# Patient Record
Sex: Male | Born: 1935 | Race: White | Hispanic: No | Marital: Married | Smoking: Never smoker
Health system: Southern US, Community
[De-identification: ages and names within clinical notes are randomized; demographics above are authoritative.]

## PROBLEM LIST (undated history)

## (undated) DIAGNOSIS — I499 Cardiac arrhythmia, unspecified: Secondary | ICD-10-CM

## (undated) DIAGNOSIS — E785 Hyperlipidemia, unspecified: Secondary | ICD-10-CM

## (undated) DIAGNOSIS — I4819 Other persistent atrial fibrillation: Secondary | ICD-10-CM

## (undated) DIAGNOSIS — I10 Essential (primary) hypertension: Secondary | ICD-10-CM

## (undated) DIAGNOSIS — Z923 Personal history of irradiation: Secondary | ICD-10-CM

## (undated) DIAGNOSIS — C61 Malignant neoplasm of prostate: Secondary | ICD-10-CM

## (undated) HISTORY — PX: CAROTID ARTERY ANGIOPLASTY: SHX1300

## (undated) HISTORY — PX: INSERTION PROSTATE RADIATION SEED: SUR718

## (undated) HISTORY — DX: Personal history of irradiation: Z92.3

## (undated) HISTORY — PX: CATARACT EXTRACTION: SUR2

## (undated) HISTORY — DX: Cardiac arrhythmia, unspecified: I49.9

## (undated) HISTORY — PX: TONSILLECTOMY: SUR1361

---

## 2016-08-27 DIAGNOSIS — C61 Malignant neoplasm of prostate: Secondary | ICD-10-CM | POA: Insufficient documentation

## 2017-07-21 ENCOUNTER — Encounter: Payer: Self-pay | Admitting: Intensive Care

## 2017-07-21 ENCOUNTER — Emergency Department: Payer: Medicare Other

## 2017-07-21 ENCOUNTER — Inpatient Hospital Stay
Admission: EM | Admit: 2017-07-21 | Discharge: 2017-07-22 | DRG: 293 | Disposition: A | Discharge status: Home / Self Care | Payer: Medicare Other | Attending: Internal Medicine | Admitting: Internal Medicine

## 2017-07-21 DIAGNOSIS — Z7982 Long term (current) use of aspirin: Secondary | ICD-10-CM | POA: Diagnosis not present

## 2017-07-21 DIAGNOSIS — I4891 Unspecified atrial fibrillation: Secondary | ICD-10-CM | POA: Diagnosis present

## 2017-07-21 DIAGNOSIS — I110 Hypertensive heart disease with heart failure: Secondary | ICD-10-CM | POA: Diagnosis present

## 2017-07-21 DIAGNOSIS — E782 Mixed hyperlipidemia: Secondary | ICD-10-CM | POA: Diagnosis present

## 2017-07-21 DIAGNOSIS — J810 Acute pulmonary edema: Secondary | ICD-10-CM

## 2017-07-21 DIAGNOSIS — I10 Essential (primary) hypertension: Secondary | ICD-10-CM | POA: Diagnosis present

## 2017-07-21 DIAGNOSIS — R0602 Shortness of breath: Secondary | ICD-10-CM

## 2017-07-21 DIAGNOSIS — I5041 Acute combined systolic (congestive) and diastolic (congestive) heart failure: Secondary | ICD-10-CM | POA: Diagnosis present

## 2017-07-21 DIAGNOSIS — Z8546 Personal history of malignant neoplasm of prostate: Secondary | ICD-10-CM

## 2017-07-21 DIAGNOSIS — Z79899 Other long term (current) drug therapy: Secondary | ICD-10-CM

## 2017-07-21 DIAGNOSIS — I5021 Acute systolic (congestive) heart failure: Secondary | ICD-10-CM | POA: Diagnosis present

## 2017-07-21 HISTORY — DX: Malignant neoplasm of prostate: C61

## 2017-07-21 HISTORY — DX: Essential (primary) hypertension: I10

## 2017-07-21 LAB — BASIC METABOLIC PANEL
ANION GAP: 9 (ref 5–15)
BUN: 22 mg/dL — AB (ref 6–20)
CALCIUM: 9.3 mg/dL (ref 8.9–10.3)
CO2: 21 mmol/L — AB (ref 22–32)
Chloride: 110 mmol/L (ref 101–111)
Creatinine, Ser: 1.12 mg/dL (ref 0.61–1.24)
GFR calc Af Amer: >60 mL/min (ref >60)
GFR calc non Af Amer: 59 mL/min — ABNORMAL LOW (ref >60)
GLUCOSE: 86 mg/dL (ref 65–99)
POTASSIUM: 4.2 mmol/L (ref 3.5–5.1)
Sodium: 140 mmol/L (ref 135–145)

## 2017-07-21 LAB — CBC
HEMATOCRIT: 46.1 % (ref 40.0–52.0)
HEMOGLOBIN: 15.4 g/dL (ref 13.0–18.0)
MCH: 31 pg (ref 26.0–34.0)
MCHC: 33.4 g/dL (ref 32.0–36.0)
MCV: 92.6 fL (ref 80.0–100.0)
Platelets: 178 10*3/uL (ref 150–440)
RBC: 4.98 MIL/uL (ref 4.40–5.90)
RDW: 15.3 % — AB (ref 11.5–14.5)
WBC: 5.9 10*3/uL (ref 3.8–10.6)

## 2017-07-21 LAB — TROPONIN I: Troponin I: <0.03 ng/mL (ref <0.03)

## 2017-07-21 LAB — PROTIME-INR
INR: 1.13
Prothrombin Time: 14.6 seconds (ref 11.4–15.2)

## 2017-07-21 LAB — BRAIN NATRIURETIC PEPTIDE: B Natriuretic Peptide: 697 pg/mL — ABNORMAL HIGH (ref 0.0–100.0)

## 2017-07-21 MED ORDER — FUROSEMIDE 10 MG/ML IJ SOLN
40.0000 mg | Freq: Once | INTRAMUSCULAR | Status: AC
  Administered 2017-07-21: 40 mg via INTRAVENOUS
  Filled 2017-07-21: qty 4

## 2017-07-21 MED ORDER — DILTIAZEM HCL 25 MG/5ML IV SOLN
15.0000 mg | Freq: Once | INTRAVENOUS | Status: AC
  Administered 2017-07-21: 15 mg via INTRAVENOUS
  Filled 2017-07-21: qty 5

## 2017-07-21 NOTE — ED Notes (Addendum)
Pt 88-89% on RA, pt kindly refuses to wear o2 [place] . MD made aware

## 2017-07-21 NOTE — ED Triage Notes (Signed)
Parient reports he has started feeling SOB with exertion the past couple of weeks and went to see his PCP today and diagnosed with new onset A-fib. Left leg swelling noted with sores present on calf. A&O x4

## 2017-07-21 NOTE — ED Provider Notes (Signed)
[facility] Emergency Department Provider Note   ____________________________________________   I have reviewed the triage vital signs and the nursing notes.   HISTORY  Chief Complaint Shortness of Breath and Atrial Fibrillation   History limited by: Not Limited   HPI Chris Davis is a 83 y.o. male who presents to the emergency department today from [facility] because of concern for abnormal heart rhythm noted on EKG. Patient went to the clinic today because of concern for shortness of breath and weakness. He has noticed this over the past couple of weeks. It is worse with exertion. He states that it will than get better after rest. The patient denies any history of atrial fibrillation. He denies any new medication. Denies any recent illness or fevers.  Past Medical History:  Diagnosis Date  . Hypertension   . Prostate cancer ([place])     There are no active problems to display for this patient.   Past Surgical History:  Procedure Laterality Date  . CAROTID ARTERY ANGIOPLASTY      Prior to Admission medications   Not on File    Allergies Patient has no known allergies.  History reviewed. No pertinent family history.  Social History Social History  Substance Use Topics  . Smoking status: Never Smoker  . Smokeless tobacco: Never Used  . Alcohol use Yes     Comment: occ    Review of Systems Constitutional: No fever/chills Eyes: No visual changes. ENT: No sore throat. Cardiovascular: Denies chest pain. Respiratory: Positive for shortness of breath. Gastrointestinal: No abdominal pain.  No nausea, no vomiting.  No diarrhea.   Genitourinary: Negative for dysuria. Musculoskeletal: Positive for lower extremity swelling.  Skin: Negative for rash. Neurological: Negative for headaches, focal weakness or numbness.  ____________________________________________   PHYSICAL EXAM:  VITAL SIGNS: ED Triage Vitals  Enc Vitals Group     BP  07/21/17 1810 (!) 145/74     Pulse Rate 07/21/17 1810 (!) 53     Resp 07/21/17 1810 20     Temp 07/21/17 1810 98.5 F (36.9 C)     Temp Source 07/21/17 1810 Oral     SpO2 07/21/17 1810 95 %     Weight 07/21/17 1811 204 lb (92.5 kg)     Height 07/21/17 1811 5\' 8"  (1.727 m)     Head Circumference --      Peak Flow --      Pain Score --      Pain Loc --      Pain Edu? --      Excl. in [place]? --      Constitutional: Alert and oriented. Well appearing and in no distress. Eyes: Conjunctivae are normal.  ENT   Head: Normocephalic and atraumatic.   Nose: No congestion/rhinnorhea.   Mouth/Throat: Mucous membranes are moist.   Neck: No stridor. Hematological/Lymphatic/Immunilogical: No cervical lymphadenopathy. Cardiovascular: Tachycardic, irregularly irregular rhythm.  No murmurs, rubs, or gallops. Respiratory: Normal respiratory effort without tachypnea nor retractions. Breath sounds are clear and equal bilaterally. No wheezes/rales/rhonchi. Gastrointestinal: Soft and non tender. No rebound. No guarding.  Genitourinary: Deferred Musculoskeletal: Normal range of motion in all extremities. 1+ bilateral pitting edema.  Neurologic:  Normal speech and language. No gross focal neurologic deficits are appreciated.  Skin:  Skin is warm, dry and intact. No rash noted. Psychiatric: Mood and affect are normal. Speech and behavior are normal. Patient exhibits appropriate insight and judgment.  ____________________________________________    LABS (pertinent positives/negatives)  Labs Reviewed  BASIC METABOLIC PANEL - Abnormal; Notable for the following:       Result Value   CO2 21 (*)    BUN 22 (*)    GFR calc non Af Amer 59 (*)    All other components within normal limits  CBC - Abnormal; Notable for the following:    RDW 15.3 (*)    All other components within normal limits  TROPONIN I  PROTIME-INR  BRAIN NATRIURETIC PEPTIDE      ____________________________________________   EKG  I, Nance Pear, attending physician, personally viewed and interpreted this EKG  EKG Time: 1806 Rate: 124 Rhythm: atrial fibrillation with RVR Axis: left axis deviation Intervals: qtc 520 QRS: narrow, q waves V1, V2 ST changes: no st elevation Impression: abnormal ekg  ____________________________________________    RADIOLOGY  CXR  IMPRESSION: Aortic atherosclerosis. Mild cardiomegaly with probable bilateral pulmonary edema and mild pleural effusions.  ____________________________________________   PROCEDURES  Procedures  ____________________________________________   INITIAL IMPRESSION / ASSESSMENT AND PLAN / ED COURSE  Pertinent labs & imaging results that were available during my care of the patient were reviewed by me and considered in my medical decision making (see chart for details).  Patient presented to the emergency department today because of concerns for abnormal heart rhythm. Patient does appear to be in A. fib. Nose have frequent PVCs. The patient's workup is concerning for heart failure given pulmonary edema and lower leg swelling. Will give patient Lasix. Will plan on admission to the hospitalist service.  ____________________________________________   FINAL CLINICAL IMPRESSION(S) / ED DIAGNOSES  Final diagnoses:  None     Note: This dictation was prepared with Dragon dictation. Any transcriptional errors that result from this process are unintentional     Nance Pear, MD 07/21/17 2136

## 2017-07-21 NOTE — H&P (Signed)
[place] at [place] NAME: Chris Davis    MR#:  [id number]  DATE OF BIRTH:  May 02, 1932  DATE OF ADMISSION:  07/21/2017  PRIMARY CARE PHYSICIAN: Derinda Late, MD   REQUESTING/REFERRING PHYSICIAN: Archie Balboa, MD  CHIEF COMPLAINT:   Chief Complaint  Patient presents with  . Shortness of Breath  . Atrial Fibrillation    HISTORY OF PRESENT ILLNESS:  Chris Davis  is a 83 y.o. male who presents with Progressive shortness of breath, increased leg swelling. He went to see his outpatient physician today who did an EKG and found that he was in A. fib. He was sent to the hospital for further evaluation and treatment. Here he was found to have an elevated BNP, he was in A. fib with RVR. His heart rate controlled well with some diltiazem in the ED. Hospitalists were called for admission and further evaluation.  PAST MEDICAL HISTORY:   Past Medical History:  Diagnosis Date  . Hypertension   . Prostate cancer ([place])     PAST SURGICAL HISTORY:   Past Surgical History:  Procedure Laterality Date  . CAROTID ARTERY ANGIOPLASTY      SOCIAL HISTORY:   Social History  Substance Use Topics  . Smoking status: Never Smoker  . Smokeless tobacco: Never Used  . Alcohol use Yes     Comment: occ    FAMILY HISTORY:   Family History  Problem Relation Age of Onset  . Breast cancer Sister   . Throat cancer Paternal Grandmother     DRUG ALLERGIES:  No Known Allergies  MEDICATIONS AT HOME:   Prior to Admission medications   Medication Sig Start Date End Date Taking? Authorizing Provider  aspirin EC 81 MG tablet Take 2 tablets by mouth daily.   Yes [provider]  atorvastatin (LIPITOR) 10 MG tablet Take 10 mg by mouth 2 (two) times daily. 06/04/17  Yes [provider]  Cranberry 400 MG CAPS Take 400 mg by mouth daily.   Yes [provider]  fluticasone (FLONASE) 50 MCG/ACT nasal spray Place 2 sprays into the nose  daily. 04/07/17 04/07/18 Yes [provider]  metoprolol tartrate (LOPRESSOR) 50 MG tablet Take 50 mg by mouth 2 (two) times daily. 06/02/17  Yes [provider]  Multiple Vitamin (MULTI-VITAMINS) TABS Take 1 tablet by mouth daily.   Yes [provider]  RA ALLERGY RELIEF 10 MG tablet Take 10 mg by mouth daily. 07/09/17  Yes [provider]    REVIEW OF SYSTEMS:  Review of Systems  Constitutional: Negative for chills, fever, malaise/fatigue and weight loss.  HENT: Negative for ear pain, hearing loss and tinnitus.   Eyes: Negative for blurred vision, double vision, pain and redness.  Respiratory: Positive for shortness of breath. Negative for cough and hemoptysis.   Cardiovascular: Positive for leg swelling. Negative for chest pain, palpitations and orthopnea.  Gastrointestinal: Negative for abdominal pain, constipation, diarrhea, nausea and vomiting.  Genitourinary: Negative for dysuria, frequency and hematuria.  Musculoskeletal: Negative for back pain, joint pain and neck pain.  Skin:       No acne, rash, or lesions  Neurological: Negative for dizziness, tremors, focal weakness and weakness.  Endo/Heme/Allergies: Negative for polydipsia. Does not bruise/bleed easily.  Psychiatric/Behavioral: Negative for depression. The patient is not nervous/anxious and does not have insomnia.      VITAL SIGNS:   Vitals:   07/21/17 2137 07/21/17 2200 07/21/17 2230 07/21/17 2246  BP: (!) 167/105  135/88 (!) 88/67 (!) 91/58  Pulse: (!) 108 85 (!) 54 (!) 44  Resp: (!) 24 17 20  (!) 22  Temp:      TempSrc:      SpO2: 93% 90% 90% 90%  Weight:      Height:       Wt Readings from Last 3 Encounters:  07/21/17 92.5 kg (204 lb)    PHYSICAL EXAMINATION:  Physical Exam  Vitals reviewed. Constitutional: He is oriented to person, place, and time. He appears well-developed and well-nourished. No distress.  HENT:  Head: Normocephalic and atraumatic.  Mouth/Throat:  Oropharynx is clear and moist.  Eyes: Pupils are equal, round, and reactive to light. Conjunctivae and EOM are normal. No scleral icterus.  Neck: Normal range of motion. Neck supple. No JVD present. No thyromegaly present.  Cardiovascular: Normal rate and intact distal pulses.  Exam reveals no gallop and no friction rub.   No murmur heard. Irregular rhythm  Respiratory: Effort normal. No respiratory distress. He has no wheezes. He has rales.  GI: Soft. Bowel sounds are normal. He exhibits no distension. There is no tenderness.  Musculoskeletal: Normal range of motion. He exhibits edema.  No arthritis, no gout  Lymphadenopathy:    He has no cervical adenopathy.  Neurological: He is alert and oriented to person, place, and time. No cranial nerve deficit.  No dysarthria, no aphasia  Skin: Skin is warm and dry. No rash noted. No erythema.  Psychiatric: He has a normal mood and affect. His behavior is normal. Judgment and thought content normal.    LABORATORY PANEL:   CBC  Recent Labs Lab 07/21/17 1832  WBC 5.9  HGB 15.4  HCT 46.1  PLT 178   ------------------------------------------------------------------------------------------------------------------  Chemistries   Recent Labs Lab 07/21/17 1832  NA 140  K 4.2  CL 110  CO2 21*  GLUCOSE 86  BUN 22*  CREATININE 1.12  CALCIUM 9.3   ------------------------------------------------------------------------------------------------------------------  Cardiac Enzymes  Recent Labs Lab 07/21/17 1832  TROPONINI <0.03   ------------------------------------------------------------------------------------------------------------------  RADIOLOGY:  Dg Chest 2 View  Result Date: 07/21/2017 CLINICAL DATA:  Shortness of breath. EXAM: CHEST  2 VIEW COMPARISON:  None. FINDINGS: Mild cardiomegaly is noted. Atherosclerosis of thoracic aorta is noted. No pneumothorax is noted. Mild bilateral perihilar and basilar interstitial  densities are noted concerning for pulmonary edema. Mild bilateral pleural effusions are noted, with right greater than left. Bony thorax is unremarkable. IMPRESSION: Aortic atherosclerosis. Mild cardiomegaly with probable bilateral pulmonary edema and mild pleural effusions. Electronically Signed   By: Marijo Conception, M.D.   On: 07/21/2017 21:05    EKG:   Orders placed or performed during the hospital encounter of 07/21/17  . EKG 12-Lead  . EKG 12-Lead    IMPRESSION AND PLAN:  Principal Problem:   Acute systolic CHF (congestive heart failure) (HCC) - IV Lasix given in the ED with good effect. We will get an echocardiogram tomorrow to cardiology consult, we'll trend his cardiac enzymes tonight Active Problems:   New onset atrial fibrillation ([place]) - continue rate controlling medication, cardiology consult and workup as above   HTN (hypertension) - patient's blood pressure is low and normotensive this time, hold antihypertensives for now  All the records are reviewed and case discussed with ED provider. Management plans discussed with the patient and/or family.  DVT PROPHYLAXIS: SubQ lovenoxt  GI PROPHYLAXIS: None  ADMISSION STATUS: Inpatient  CODE STATUS: Full Code Status History    This patient does not have a  recorded code status. Please follow your organizational policy for patients in this situation.    Advance Directive Documentation     Most Recent Value  Type of Advance Directive  Healthcare Power of Attorney, Living will, Out of facility DNR (pink MOST or yellow form)  Pre-existing out of facility DNR order (yellow form or pink MOST form)  -  "MOST" Form in Place?  -      TOTAL TIME TAKING CARE OF THIS PATIENT: 45 minutes.   Jannifer Franklin, [place] 07/21/2017, 11:46 PM  [facility]  Office  [phone]  CC: Primary care physician; Derinda Late, MD  Note:  This document was prepared using Dragon voice recognition software and may include  unintentional dictation errors.

## 2017-07-22 LAB — BASIC METABOLIC PANEL
ANION GAP: 9 (ref 5–15)
BUN: 19 mg/dL (ref 6–20)
CO2: 25 mmol/L (ref 22–32)
Calcium: 9.3 mg/dL (ref 8.9–10.3)
Chloride: 109 mmol/L (ref 101–111)
Creatinine, Ser: 1.19 mg/dL (ref 0.61–1.24)
GFR calc non Af Amer: 55 mL/min — ABNORMAL LOW (ref >60)
Glucose, Bld: 90 mg/dL (ref 65–99)
POTASSIUM: 4 mmol/L (ref 3.5–5.1)
Sodium: 143 mmol/L (ref 135–145)

## 2017-07-22 LAB — CBC
HEMATOCRIT: 45.8 % (ref 40.0–52.0)
HEMOGLOBIN: 15.5 g/dL (ref 13.0–18.0)
MCH: 31.3 pg (ref 26.0–34.0)
MCHC: 33.8 g/dL (ref 32.0–36.0)
MCV: 92.4 fL (ref 80.0–100.0)
PLATELETS: 169 10*3/uL (ref 150–440)
RBC: 4.95 MIL/uL (ref 4.40–5.90)
RDW: 15.2 % — ABNORMAL HIGH (ref 11.5–14.5)
WBC: 6.4 10*3/uL (ref 3.8–10.6)

## 2017-07-22 LAB — TROPONIN I: Troponin I: <0.03 ng/mL (ref <0.03)

## 2017-07-22 LAB — TSH: TSH: 1.992 u[IU]/mL (ref 0.350–4.500)

## 2017-07-22 MED ORDER — METOPROLOL TARTRATE 50 MG PO TABS
50.0000 mg | ORAL_TABLET | Freq: Two times a day (BID) | ORAL | Status: DC
  Administered 2017-07-22: 50 mg via ORAL
  Filled 2017-07-22: qty 1

## 2017-07-22 MED ORDER — APIXABAN 5 MG PO TABS: 5.0000 mg | ORAL_TABLET | Freq: Two times a day (BID) | ORAL | 0 refills | Status: DC

## 2017-07-22 MED ORDER — FUROSEMIDE 10 MG/ML IJ SOLN
20.0000 mg | Freq: Two times a day (BID) | INTRAMUSCULAR | Status: DC
  Administered 2017-07-22: 20 mg via INTRAVENOUS
  Filled 2017-07-22: qty 2

## 2017-07-22 MED ORDER — POTASSIUM CHLORIDE ER 10 MEQ PO TBCR: 10.0000 meq | EXTENDED_RELEASE_TABLET | Freq: Every day | ORAL | 0 refills | Status: DC

## 2017-07-22 MED ORDER — ACETAMINOPHEN 325 MG PO TABS: 650.0000 mg | ORAL_TABLET | Freq: Four times a day (QID) | ORAL | Status: DC | PRN

## 2017-07-22 MED ORDER — ASPIRIN EC 81 MG PO TBEC
162.0000 mg | DELAYED_RELEASE_TABLET | Freq: Every day | ORAL | Status: DC
  Administered 2017-07-22: 162 mg via ORAL
  Filled 2017-07-22: qty 2

## 2017-07-22 MED ORDER — ENOXAPARIN SODIUM 40 MG/0.4ML ~~LOC~~ SOLN: 40.0000 mg | SUBCUTANEOUS | Status: DC

## 2017-07-22 MED ORDER — ATORVASTATIN CALCIUM 10 MG PO TABS
10.0000 mg | ORAL_TABLET | Freq: Two times a day (BID) | ORAL | Status: DC
  Administered 2017-07-22: 10 mg via ORAL
  Filled 2017-07-22: qty 1

## 2017-07-22 MED ORDER — ACETAMINOPHEN 650 MG RE SUPP: 650.0000 mg | Freq: Four times a day (QID) | RECTAL | Status: DC | PRN

## 2017-07-22 MED ORDER — ONDANSETRON HCL 4 MG/2ML IJ SOLN: 4.0000 mg | Freq: Four times a day (QID) | INTRAMUSCULAR | Status: DC | PRN

## 2017-07-22 MED ORDER — FUROSEMIDE 40 MG PO TABS: 40.0000 mg | ORAL_TABLET | Freq: Two times a day (BID) | ORAL | 0 refills | Status: DC

## 2017-07-22 MED ORDER — ONDANSETRON HCL 4 MG PO TABS: 4.0000 mg | ORAL_TABLET | Freq: Four times a day (QID) | ORAL | Status: DC | PRN

## 2017-07-22 NOTE — Progress Notes (Signed)
Discharge Instructions reviewed with patient and spouse with patient's permission. Both parties verbalized understanding. Printed Prescriptions given to patient and patient reminded to follow up with all follow up appointments including primary care physician. IV dc'ed, cath intact. Vitals rechecked prior to discharge- MD aware of heart rate prior to d/c order. Patient denies any pain. Patient to be transported to vehicle via wheelchair. Patient's spouse to drive him home.

## 2017-07-22 NOTE — Discharge Summary (Signed)
[place] at [place] NAME: Chris Davis    MR#:  [id number]  DATE OF BIRTH:  February 18, 1932  DATE OF ADMISSION:  07/21/2017 ADMITTING PHYSICIAN: Lance Coon, MD  DATE OF DISCHARGE: 07/22/2017  PRIMARY CARE PHYSICIAN: Derinda Late, MD    ADMISSION DIAGNOSIS:  Acute pulmonary edema (HCC) [J81.0] SOB (shortness of breath) [R06.02] Atrial fibrillation, unspecified type ([place]) [I48.91]  DISCHARGE DIAGNOSIS:  Principal Problem:   Acute systolic CHF (congestive heart failure) (HCC) Active Problems:   New onset atrial fibrillation (HCC)   HTN (hypertension)   SECONDARY DIAGNOSIS:   Past Medical History:  Diagnosis Date  . Hypertension   . Prostate cancer [facility])     HOSPITAL COURSE:   83 y/o male with HTN who presented with SOB and found to have new Onset atrial fibrillation and pulmonary edema.  1. Acute congestive heart failure with pulmonary edema and elevated BNP thought to be due to new onset atrial fibrillation. Patient was evaluated by cardiology. Cardiologist is recommended outpatient echocardiogram as patient's symptoms have significantly improved since admission. He was diuresed with IV Lasix. He will continue on oral Lasix with potassium supplementation. He is referred to CHF clinic. He will have follow-up on Thursday and an echocardiogram at that time performed so we can assess his ejection fraction. He will also need BNP on Thursday.  2. New onset atrial fibrillation: Cardiology has recommended continue metoprolol for heart rate control and Eliquis for CVA prevention. Risks, side effects alternatives and benefits were discussed with the patient.  3. HTN, essential benign: Continue metoprolol.    DISCHARGE CONDITIONS AND DIET:   Stable Cardiac diet  CONSULTS OBTAINED:  Treatment Team:  Corey Skains, MD  DRUG ALLERGIES:  No Known Allergies  DISCHARGE MEDICATIONS:   Current Discharge Medication List    START  taking these medications   Details  apixaban (ELIQUIS) 5 MG TABS tablet Take 1 tablet (5 mg total) by mouth 2 (two) times daily. Qty: 60 tablet, Refills: 0    furosemide (LASIX) 40 MG tablet Take 1 tablet (40 mg total) by mouth 2 (two) times daily. Qty: 30 tablet, Refills: 0    potassium chloride (K-DUR) 10 MEQ tablet Take 1 tablet (10 mEq total) by mouth daily. Qty: 30 tablet, Refills: 0      CONTINUE these medications which have NOT CHANGED   Details  atorvastatin (LIPITOR) 10 MG tablet Take 10 mg by mouth 2 (two) times daily. Refills: 0    Cranberry 400 MG CAPS Take 400 mg by mouth daily.    fluticasone (FLONASE) 50 MCG/ACT nasal spray Place 2 sprays into the nose daily.    metoprolol tartrate (LOPRESSOR) 50 MG tablet Take 50 mg by mouth 2 (two) times daily. Refills: 0    Multiple Vitamin (MULTI-VITAMINS) TABS Take 1 tablet by mouth daily.    RA ALLERGY RELIEF 10 MG tablet Take 10 mg by mouth daily. Refills: 0      STOP taking these medications     aspirin EC 81 MG tablet           Today   CHIEF COMPLAINT:  Patient without dyspnea, shortness of breath, PND orthopnea this morning. Feels 100% better. Is not complaining of chest pain or palpitations. States that he would like to go home. He still has mild lower extremity edema. He reports that his weight was 204. Today on the scale it is 191  Which is at his baseline.  VITAL SIGNS:  Blood pressure 140/72, pulse (!) 140, temperature 98.1 F (36.7 C), temperature source Oral, resp. rate 18, height 5\' 8"  (1.727 m), weight 86.9 kg (191 lb 8 oz), SpO2 95 %.   REVIEW OF SYSTEMS:  Review of Systems  Constitutional: Negative.  Negative for chills, fever and malaise/fatigue.  HENT: Negative.  Negative for ear discharge, ear pain, hearing loss, nosebleeds and sore throat.   Eyes: Negative.  Negative for blurred vision and pain.  Respiratory: Negative.  Negative for cough, hemoptysis, shortness of breath and wheezing.    Cardiovascular: Positive for leg swelling. Negative for chest pain and palpitations.  Gastrointestinal: Negative.  Negative for abdominal pain, blood in stool, diarrhea, nausea and vomiting.  Genitourinary: Negative.  Negative for dysuria.  Musculoskeletal: Negative.  Negative for back pain.  Skin: Negative.   Neurological: Negative for dizziness, tremors, speech change, focal weakness, seizures and headaches.  Endo/Heme/Allergies: Negative.  Does not bruise/bleed easily.  Psychiatric/Behavioral: Negative.  Negative for depression, hallucinations and suicidal ideas.     PHYSICAL EXAMINATION:  GENERAL:  83 y.o.-year-old patient lying in the bed with no acute distress.  NECK:  Supple, no jugular venous distention. No thyroid enlargement, no tenderness.  LUNGS: Normal breath sounds bilaterally, no wheezing, rales,rhonchi  No use of accessory muscles of respiration.  CARDIOVASCULAR: S1, S2 normal. No murmurs, rubs, or gallops.  ABDOMEN: Soft, non-tender, non-distended. Bowel sounds present. No organomegaly or mass.  EXTREMITIES: 1+ pedal edema, No cyanosis, or clubbing.  PSYCHIATRIC: The patient is alert and oriented x 3.  SKIN: No obvious rash, lesion, or ulcer.   DATA REVIEW:   CBC  Recent Labs Lab 07/22/17 0758  WBC 6.4  HGB 15.5  HCT 45.8  PLT 169    Chemistries   Recent Labs Lab 07/22/17 0758  NA 143  K 4.0  CL 109  CO2 25  GLUCOSE 90  BUN 19  CREATININE 1.19  CALCIUM 9.3    Cardiac Enzymes  Recent Labs Lab 07/21/17 1832 07/22/17 0236 07/22/17 0758  TROPONINI <0.03 <0.03 <0.03    Microbiology Results  @[email or web address] @  RADIOLOGY:  Dg Chest 2 View  Result Date: 07/21/2017 CLINICAL DATA:  Shortness of breath. EXAM: CHEST  2 VIEW COMPARISON:  None. FINDINGS: Mild cardiomegaly is noted. Atherosclerosis of thoracic aorta is noted. No pneumothorax is noted. Mild bilateral perihilar and basilar interstitial densities are noted concerning for pulmonary edema.  Mild bilateral pleural effusions are noted, with right greater than left. Bony thorax is unremarkable. IMPRESSION: Aortic atherosclerosis. Mild cardiomegaly with probable bilateral pulmonary edema and mild pleural effusions. Electronically Signed   By: Marijo Conception, M.D.   On: 07/21/2017 21:05      Current Discharge Medication List    START taking these medications   Details  apixaban (ELIQUIS) 5 MG TABS tablet Take 1 tablet (5 mg total) by mouth 2 (two) times daily. Qty: 60 tablet, Refills: 0    furosemide (LASIX) 40 MG tablet Take 1 tablet (40 mg total) by mouth 2 (two) times daily. Qty: 30 tablet, Refills: 0    potassium chloride (K-DUR) 10 MEQ tablet Take 1 tablet (10 mEq total) by mouth daily. Qty: 30 tablet, Refills: 0      CONTINUE these medications which have NOT CHANGED   Details  atorvastatin (LIPITOR) 10 MG tablet Take 10 mg by mouth 2 (two) times daily. Refills: 0    Cranberry 400 MG CAPS Take 400 mg by mouth daily.    fluticasone (FLONASE) 50 MCG/ACT nasal spray  Place 2 sprays into the nose daily.    metoprolol tartrate (LOPRESSOR) 50 MG tablet Take 50 mg by mouth 2 (two) times daily. Refills: 0    Multiple Vitamin (MULTI-VITAMINS) TABS Take 1 tablet by mouth daily.    RA ALLERGY RELIEF 10 MG tablet Take 10 mg by mouth daily. Refills: 0      STOP taking these medications     aspirin EC 81 MG tablet           Management plans discussed with the patient and he is in agreement. Stable for discharge home  Patient should follow up with dr Nehemiah Massed  CODE STATUS:     Code Status Orders        Start     Ordered   07/22/17 0222  Full code  Continuous     07/22/17 0222    Code Status History    Date Active Date Inactive Code Status Order ID Comments User Context   This patient has a current code status but no historical code status.    Advance Directive Documentation     Most Recent Value  Type of Advance Directive  Healthcare Power of  Attorney, Living will, Out of facility DNR (pink MOST or yellow form)  Pre-existing out of facility DNR order (yellow form or pink MOST form)  -  "MOST" Form in Place?  -      TOTAL TIME TAKING CARE OF THIS PATIENT: 37 minutes.    Note: This dictation was prepared with Dragon dictation along with smaller phrase technology. Any transcriptional errors that result from this process are unintentional.  Antoino Westhoff M.D on 07/22/2017 at 9:19 AM  Between 7am to 6pm - Pager - [phone] After 6pm go to [email or web address] - password [email or web address] [facility]  Office  [phone]  CC: Primary care physician; Derinda Late, MD

## 2017-07-22 NOTE — Consult Note (Signed)
[facility] Consultation Note  Patient ID: Chris Davis, MRN: [id number], DOB/AGE: 1932/11/26 83 y.o. Admit date: 07/21/2017   Date of Consult: 07/22/2017 Primary Physician: Derinda Late, MD Primary Cardiologist: None  Chief Complaint:  Chief Complaint  Patient presents with  . Shortness of Breath  . Atrial Fibrillation   Reason for Consult: shortness of breath with atrial fibrillation  HPI: 83 y.o. male with known essential hypertension mixed hyperlipidemia who has had some recent episodes of palpitations irregular heart beat over the last several weeks. When he does activities including walking he does have more shortness of breath with physical activity relieved by rest. The patient had some irregular heart beat as well. When further evaluation by his outpatient primary care physician the patient had an EKG showing atrial fibrillation with variable rate and preventricular contractions. There was no chest pain and no evidence of myocardial infarction with a normal troponin although it did patient does have a BNP of 679 consistent with possible diastolic dysfunction heart failure. The patient was placed on metoprolol with better heart rate control of atrial fibrillation at this time and feeling better. Furosemide was given with improvements of symptoms of shortness of breath. Additionally the patient is on high intensity cholesterol therapy and stable. She will need anticoagulation for further risk reduction with atrial fibrillation.  Past Medical History:  Diagnosis Date  . Hypertension   . Prostate cancer [facility])       Surgical History:  Past Surgical History:  Procedure Laterality Date  . CAROTID ARTERY ANGIOPLASTY       Home Meds: Prior to Admission medications   Medication Sig Start Date End Date Taking? Authorizing Provider  aspirin EC 81 MG tablet Take 2 tablets by mouth daily.   Yes [provider]  atorvastatin (LIPITOR) 10 MG tablet Take 10 mg by mouth  2 (two) times daily. 06/04/17  Yes [provider]  Cranberry 400 MG CAPS Take 400 mg by mouth daily.   Yes [provider]  fluticasone (FLONASE) 50 MCG/ACT nasal spray Place 2 sprays into the nose daily. 04/07/17 04/07/18 Yes [provider]  metoprolol tartrate (LOPRESSOR) 50 MG tablet Take 50 mg by mouth 2 (two) times daily. 06/02/17  Yes [provider]  Multiple Vitamin (MULTI-VITAMINS) TABS Take 1 tablet by mouth daily.   Yes [provider]  RA ALLERGY RELIEF 10 MG tablet Take 10 mg by mouth daily. 07/09/17  Yes [provider]    Inpatient Medications:  . aspirin EC  162 mg Oral Daily  . atorvastatin  10 mg Oral BID  . enoxaparin (LOVENOX) injection  40 mg Subcutaneous Q24H  . furosemide  20 mg Intravenous BID  . metoprolol tartrate  50 mg Oral BID     Allergies: No Known Allergies  Social History   Social History  . Marital status: Married    Spouse name: N/A  . Number of children: N/A  . Years of education: N/A   Occupational History  . Not on file.   Social History Main Topics  . Smoking status: Never Smoker  . Smokeless tobacco: Never Used  . Alcohol use Yes     Comment: occ  . Drug use: No  . Sexual activity: Not on file   Other Topics Concern  . Not on file   Social History Narrative  . No narrative on file     Family History  Problem Relation Age of Onset  . Breast cancer Sister   .  Throat cancer Paternal Grandmother      Review of Systems Positive for Shortness of breath palpitations Negative for: General:  chills, fever, night sweats or weight changes.  Cardiovascular: PND orthopnea syncope dizziness  Dermatological skin lesions rashes Respiratory: Cough congestion Urologic: Frequent urination urination at night and hematuria Abdominal: negative for nausea, vomiting, diarrhea, bright red blood per rectum, melena, or hematemesis Neurologic: negative for visual changes, and/or hearing changes   All other systems reviewed and are otherwise negative except as noted above.  Labs:  Recent Labs  07/21/17 1832 07/22/17 0236  TROPONINI <0.03 <0.03   Lab Results  Component Value Date   WBC 6.4 07/22/2017   HGB 15.5 07/22/2017   HCT 45.8 07/22/2017   MCV 92.4 07/22/2017   PLT 169 07/22/2017    Recent Labs Lab 07/21/17 1832  NA 140  K 4.2  CL 110  CO2 21*  BUN 22*  CREATININE 1.12  CALCIUM 9.3  GLUCOSE 86   No results found for: CHOL, HDL, LDLCALC, TRIG No results found for: DDIMER  Radiology/Studies:  Dg Chest 2 View  Result Date: 07/21/2017 CLINICAL DATA:  Shortness of breath. EXAM: CHEST  2 VIEW COMPARISON:  None. FINDINGS: Mild cardiomegaly is noted. Atherosclerosis of thoracic aorta is noted. No pneumothorax is noted. Mild bilateral perihilar and basilar interstitial densities are noted concerning for pulmonary edema. Mild bilateral pleural effusions are noted, with right greater than left. Bony thorax is unremarkable. IMPRESSION: Aortic atherosclerosis. Mild cardiomegaly with probable bilateral pulmonary edema and mild pleural effusions. Electronically Signed   By: Marijo Conception, M.D.   On: 07/21/2017 21:05    EKG: Atrial fibrillation with left axis deviation preventricular contractions  Weights: Filed Weights   07/21/17 1811 07/22/17 0205 07/22/17 0500  Weight: 92.5 kg (204 lb) 88.7 kg (195 lb 8 oz) 86.9 kg (191 lb 8 oz)     Physical Exam: Blood pressure 140/72, pulse (!) 120, temperature 98.1 F (36.7 C), temperature source Oral, resp. rate 18, height 5\' 8"  (1.727 m), weight 86.9 kg (191 lb 8 oz), SpO2 95 %. Body mass index is 29.12 kg/m. General: Well developed, well nourished, in no acute distress. Head eyes ears nose throat: Normocephalic, atraumatic, sclera non-icteric, no xanthomas, nares are without discharge. No apparent thyromegaly and/or mass  Lungs: Normal respiratory effort.  no wheezes,Few rales, no rhonchi.  Heart: Irregular with normal  S1 S2. no murmur gallop, no rub, PMI is normal size and placement, carotid upstroke normal without bruit, jugular venous pressure is normal Abdomen: Soft, non-tender, non-distended with normoactive bowel sounds. No hepatomegaly. No rebound/guarding. No obvious abdominal masses. Abdominal aorta is normal size without bruit Extremities: Trace edema. no cyanosis, no clubbing, no ulcers  Peripheral : 2+ bilateral upper extremity pulses, 2+ bilateral femoral pulses, 2+ bilateral dorsal pedal pulse Neuro: Alert and oriented. No facial asymmetry. No focal deficit. Moves all extremities spontaneously. Musculoskeletal: Normal muscle tone without kyphosis Psych:  Responds to questions appropriately with a normal affect.    Assessment: 83 year old male with essential hypertension mixed hyperlipidemia having symptoms consistent with atrial fibrillation with rapid ventricular rate without evidence of myocardial infarction and with the some diastolic dysfunction congestive heart failure  Plan: 1. Continue furosemide orally for diastolic dysfunction congestive heart failure and shortness of breath 2. Continue metoprolol at current dose for heart rate control of atrial fibrillation and possible spontaneous conversion to normal rhythm 3. Anticoagulation for further risk reduction in stroke with atrial fibrillation 4. Echocardiogram as outpatient for further  evaluation of structural abnormalities and other treatment options 5. High intensity cholesterol therapy 6. Begin ambulation and follow for improvements of symptoms and possible discharge home today with follow-up next week for adjustments of medications and not further treatment with possible conversion to normal rhythm  Signed, Corey Skains M.D. [facility] 07/22/2017, 8:43 AM

## 2017-07-22 NOTE — Care Management (Signed)
Patient to discharge home on new Eliquis.  Confirms that he has pharmacy coverage through his medicare plan.  Provided patient with 30 day trial coupon.  No other needs identified by patient or members of care team

## 2017-07-22 NOTE — Discharge Instructions (Signed)
Heart Failure Clinic appointment on July 30 2017 at 11:40am with Darylene Price, [place]. Please call [phone] to reschedule.

## 2017-07-30 ENCOUNTER — Ambulatory Visit: Payer: Medicare Other | Attending: Family | Admitting: Family

## 2017-07-30 ENCOUNTER — Encounter: Payer: Self-pay | Admitting: Family

## 2017-07-30 VITALS — BP 112/64 | HR 50 | Resp 18 | Ht 68.0 in | Wt 192.4 lb

## 2017-07-30 DIAGNOSIS — I110 Hypertensive heart disease with heart failure: Secondary | ICD-10-CM | POA: Diagnosis not present

## 2017-07-30 DIAGNOSIS — Z8546 Personal history of malignant neoplasm of prostate: Secondary | ICD-10-CM | POA: Insufficient documentation

## 2017-07-30 DIAGNOSIS — Z79899 Other long term (current) drug therapy: Secondary | ICD-10-CM | POA: Insufficient documentation

## 2017-07-30 DIAGNOSIS — I509 Heart failure, unspecified: Secondary | ICD-10-CM | POA: Insufficient documentation

## 2017-07-30 DIAGNOSIS — Z7901 Long term (current) use of anticoagulants: Secondary | ICD-10-CM | POA: Insufficient documentation

## 2017-07-30 DIAGNOSIS — I10 Essential (primary) hypertension: Secondary | ICD-10-CM

## 2017-07-30 DIAGNOSIS — I5022 Chronic systolic (congestive) heart failure: Secondary | ICD-10-CM

## 2017-07-30 DIAGNOSIS — I482 Chronic atrial fibrillation, unspecified: Secondary | ICD-10-CM

## 2017-07-30 DIAGNOSIS — Z923 Personal history of irradiation: Secondary | ICD-10-CM | POA: Insufficient documentation

## 2017-07-30 DIAGNOSIS — I4891 Unspecified atrial fibrillation: Secondary | ICD-10-CM | POA: Diagnosis not present

## 2017-07-30 DIAGNOSIS — Z803 Family history of malignant neoplasm of breast: Secondary | ICD-10-CM | POA: Diagnosis not present

## 2017-07-30 NOTE — Progress Notes (Signed)
Patient ID: Chris Davis, male    DOB: 16-Mar-1932, 83 y.o.   MRN: [id number]  HPI  Chris Davis is a 83 y/o male with a history of prostate cancer, HTN, atrial fibrillation and chronic heart failure.   Echo done 06/25/16 but unable to visualize results.  Admitted 07/21/17 due to HF exacerbation and new onset atrial fibrillation. BNP elevated. Cardiology consult obtained with recommendation of outpatient echocardiogram. Discharged the next day.   He presents today for his initial visit with a chief complaint of mild shortness of breath upon moderate exertion. He describes this as chronic in nature having been present for several months with varying levels of severity. He has associated fatigue, edema and easy bruising along with this. He denies any chest pain or weight gain.  Past Medical History:  Diagnosis Date  . Arrhythmia    atrial fibrillation  . Hx of radiation therapy   . Hypertension   . Prostate cancer ([place])   . Prostate cancer [facility])    Past Surgical History:  Procedure Laterality Date  . CAROTID ARTERY ANGIOPLASTY    . CATARACT EXTRACTION Bilateral   . INSERTION PROSTATE RADIATION SEED    . TONSILLECTOMY     Family History  Problem Relation Age of Onset  . Breast cancer Sister   . Throat cancer Paternal Grandmother    Social History  Substance Use Topics  . Smoking status: Never Smoker  . Smokeless tobacco: Never Used  . Alcohol use Yes     Comment: occ   No Known Allergies Prior to Admission medications   Medication Sig Start Date End Date Taking? Authorizing Provider  apixaban (ELIQUIS) 5 MG TABS tablet Take 1 tablet (5 mg total) by mouth 2 (two) times daily. 07/22/17  Yes Mody, Ulice Bold, MD  atorvastatin (LIPITOR) 10 MG tablet Take 10 mg by mouth 2 (two) times daily. 06/04/17  Yes [provider]  Cranberry 400 MG CAPS Take 400 mg by mouth daily.   Yes [provider]  furosemide (LASIX) 40 MG tablet Take 40 mg by mouth daily.   Yes [provider]  metoprolol tartrate (LOPRESSOR) 50 MG tablet Take 50 mg by mouth 2 (two) times daily. 06/02/17  Yes [provider]  Multiple Vitamin (MULTI-VITAMINS) TABS Take 1 tablet by mouth daily.   Yes [provider]  potassium chloride (K-DUR) 10 MEQ tablet Take 1 tablet (10 mEq total) by mouth daily. 07/22/17  Yes Mody, Ulice Bold, MD  fluticasone (FLONASE) 50 MCG/ACT nasal spray Place 2 sprays into the nose daily. 04/07/17 04/07/18  [provider]  RA ALLERGY RELIEF 10 MG tablet Take 10 mg by mouth daily. 07/09/17   [provider]   Review of Systems  Constitutional: Positive for fatigue. Negative for appetite change.  HENT: Negative for congestion, postnasal drip and sore throat.   Eyes: Negative.   Respiratory: Positive for shortness of breath. Negative for chest tightness.   Cardiovascular: Positive for leg swelling. Negative for chest pain and palpitations.  Gastrointestinal: Negative for abdominal distention and abdominal pain.  Endocrine: Negative.   Genitourinary: Negative.   Musculoskeletal: Negative for back pain and neck pain.  Skin: Negative.   Allergic/Immunologic: Negative.   Neurological: Negative for dizziness and light-headedness.  Hematological: Negative for adenopathy. Bruises/bleeds easily.  Psychiatric/Behavioral: Negative for dysphoric mood and sleep disturbance (sleeping on 1 pillow). The patient is not nervous/anxious.    Vitals:   07/30/17 1159  BP: 112/64  Pulse: (!) 50  Resp: 18  SpO2: 99%  Weight: 192 lb 6 oz (87.3 kg)  Height: 5\' 8"  (1.727 m)   Wt Readings from Last 3 Encounters:  07/30/17 192 lb 6 oz (87.3 kg)  07/22/17 191 lb 8 oz (86.9 kg)   Lab Results  Component Value Date   CREATININE 1.19 07/22/2017   CREATININE 1.12 07/21/2017    Physical Exam  Constitutional: He is oriented to person, place, and time. He appears well-developed and well-nourished.  HENT:  Head: Normocephalic and atraumatic.  Neck:  Normal range of motion. Neck supple. No JVD present.  Cardiovascular: An irregular rhythm present. Bradycardia present.   Pulmonary/Chest: Effort normal. He has no wheezes. He has no rales.  Abdominal: Soft. He exhibits no distension. There is no tenderness.  Musculoskeletal: He exhibits edema (1+ pitting edema in bilateral lower legs). He exhibits no tenderness.  Neurological: He is alert and oriented to person, place, and time.  Skin: Skin is warm and dry.  Psychiatric: He has a normal mood and affect. His behavior is normal. Thought content normal.  Nursing note and vitals reviewed.   Assessment & Plan:   1: Chronic heart failure with unknown ejection fraction- - NYHA class II - mild fluid overload today - already weighing daily and says that his weight has been stable. Instructed to call for an overnight weight gain of >2 pounds or a weekly weight gain of >5 pounds - not adding salt and he's diligent about reading food labels. Explained the importance of closely following a 2000mg  sodium diet and written dietary information was given to him about this - would like to resume bike riding and he was instructed to start slowly and for a short period of time (5-10 minutes)  - sees cardiologist Nehemiah Massed) 08/05/17 - need to get an updated echo; if not done prior to his visit here, will order it at that time.   2: HTN- - BP looks good today - saw PCP Baldemar Lenis) 07/25/17  3: Atrial fibrillation- - currently rate controlled on metoprolol and apixaban  Medication bottles were reviewed.  Return in 1 month or sooner for any questions/problems before then.

## 2017-07-30 NOTE — Patient Instructions (Signed)
Continue weighing daily and call for an overnight weight gain of > 2 pounds or a weekly weight gain of >5 pounds. 

## 2017-07-31 ENCOUNTER — Encounter: Payer: Self-pay | Admitting: Family

## 2017-07-31 ENCOUNTER — Other Ambulatory Visit: Payer: Self-pay

## 2017-07-31 DIAGNOSIS — I5022 Chronic systolic (congestive) heart failure: Secondary | ICD-10-CM | POA: Insufficient documentation

## 2017-07-31 DIAGNOSIS — I4891 Unspecified atrial fibrillation: Secondary | ICD-10-CM | POA: Insufficient documentation

## 2017-07-31 DIAGNOSIS — I5021 Acute systolic (congestive) heart failure: Secondary | ICD-10-CM

## 2017-07-31 HISTORY — DX: Chronic systolic (congestive) heart failure: I50.22

## 2017-08-14 ENCOUNTER — Ambulatory Visit: Payer: Medicare Other

## 2017-08-21 ENCOUNTER — Telehealth: Payer: Self-pay | Admitting: Family

## 2017-08-21 NOTE — Telephone Encounter (Signed)
Received call from patient stating that his legs are feeling puffy. He has resumed riding his bicycle some. Has gained a few pounds since he was last here but hasn't had an overnight weight gain of >2 pounds. No change in his shortness of breath. Has not been elevating his legs much.   Weight today is 193.2 pounds and yesterday his weight was 193.4 pounds.  Advised patient to elevate his legs when he can on his ottoman and add a pillow or two under his legs. He can also take an additional 1/2 tablet of furosemide (would be 20mg  dose) along with 1/2 tablet of potassium (8meq) today.  He is to call back tomorrow with an update of weight and edema.

## 2017-08-22 ENCOUNTER — Telehealth: Payer: Self-pay | Admitting: Family

## 2017-08-22 NOTE — Telephone Encounter (Signed)
Patient says that yesterday he took the additional 1/2 tablet of furosemide and 1/2 tablet of potassium.Today his weight is down 2 pounds and his legs feel less puffy/tight.  Advised patient to take his regular doses today and if he has an overnight weight gain of >2 pounds, weight gain of >5 pounds in one week or increase in swelling in his legs that he could take the additional 1/2 tablet of furosemide along with 1/2 tablet of potassium.   Patient verbalized understanding and was appreciative of the instructions.

## 2017-09-08 ENCOUNTER — Ambulatory Visit: Payer: Medicare Other | Admitting: Family

## 2017-09-10 ENCOUNTER — Ambulatory Visit: Payer: Medicare Other | Admitting: Anesthesiology

## 2017-09-10 ENCOUNTER — Other Ambulatory Visit: Payer: Self-pay

## 2017-09-10 ENCOUNTER — Encounter: Payer: Self-pay | Admitting: *Deleted

## 2017-09-10 ENCOUNTER — Ambulatory Visit
Admission: RE | Admit: 2017-09-10 | Discharge: 2017-09-10 | Disposition: A | Discharge status: Home / Self Care | Payer: Medicare Other | Source: Ambulatory Visit | Attending: Internal Medicine | Admitting: Internal Medicine

## 2017-09-10 ENCOUNTER — Encounter
Admission: RE | Disposition: A | Discharge status: Home / Self Care | Payer: Self-pay | Source: Ambulatory Visit | Attending: Internal Medicine

## 2017-09-10 DIAGNOSIS — Z7901 Long term (current) use of anticoagulants: Secondary | ICD-10-CM | POA: Insufficient documentation

## 2017-09-10 DIAGNOSIS — I10 Essential (primary) hypertension: Secondary | ICD-10-CM | POA: Insufficient documentation

## 2017-09-10 DIAGNOSIS — Z800 Family history of malignant neoplasm of digestive organs: Secondary | ICD-10-CM | POA: Insufficient documentation

## 2017-09-10 DIAGNOSIS — Z79899 Other long term (current) drug therapy: Secondary | ICD-10-CM | POA: Insufficient documentation

## 2017-09-10 DIAGNOSIS — E785 Hyperlipidemia, unspecified: Secondary | ICD-10-CM | POA: Diagnosis not present

## 2017-09-10 DIAGNOSIS — I480 Paroxysmal atrial fibrillation: Secondary | ICD-10-CM | POA: Diagnosis not present

## 2017-09-10 DIAGNOSIS — I110 Hypertensive heart disease with heart failure: Secondary | ICD-10-CM | POA: Insufficient documentation

## 2017-09-10 DIAGNOSIS — R001 Bradycardia, unspecified: Secondary | ICD-10-CM | POA: Diagnosis not present

## 2017-09-10 DIAGNOSIS — Z8546 Personal history of malignant neoplasm of prostate: Secondary | ICD-10-CM | POA: Insufficient documentation

## 2017-09-10 DIAGNOSIS — Z9842 Cataract extraction status, left eye: Secondary | ICD-10-CM | POA: Diagnosis not present

## 2017-09-10 DIAGNOSIS — I5022 Chronic systolic (congestive) heart failure: Secondary | ICD-10-CM | POA: Diagnosis not present

## 2017-09-10 DIAGNOSIS — Z9841 Cataract extraction status, right eye: Secondary | ICD-10-CM | POA: Diagnosis not present

## 2017-09-10 DIAGNOSIS — Z803 Family history of malignant neoplasm of breast: Secondary | ICD-10-CM | POA: Diagnosis not present

## 2017-09-10 DIAGNOSIS — I4891 Unspecified atrial fibrillation: Secondary | ICD-10-CM | POA: Diagnosis present

## 2017-09-10 DIAGNOSIS — I4581 Long QT syndrome: Secondary | ICD-10-CM | POA: Diagnosis not present

## 2017-09-10 HISTORY — PX: CARDIOVERSION: EP1203

## 2017-09-10 HISTORY — DX: Hyperlipidemia, unspecified: E78.5

## 2017-09-10 SURGERY — CARDIOVERSION (CATH LAB)
Anesthesia: General

## 2017-09-10 MED ORDER — SUCCINYLCHOLINE CHLORIDE 20 MG/ML IJ SOLN
INTRAMUSCULAR | Status: AC
  Filled 2017-09-10: qty 1

## 2017-09-10 MED ORDER — LIDOCAINE HCL (PF) 2 % IJ SOLN
INTRAMUSCULAR | Status: AC
  Filled 2017-09-10: qty 2

## 2017-09-10 MED ORDER — SODIUM CHLORIDE 0.9 % IV SOLN
INTRAVENOUS | Status: DC
  Administered 2017-09-10: 08:00:00 via INTRAVENOUS

## 2017-09-10 MED ORDER — PROPOFOL 10 MG/ML IV BOLUS
INTRAVENOUS | Status: AC
  Filled 2017-09-10: qty 20

## 2017-09-10 MED ORDER — EPHEDRINE SULFATE 50 MG/ML IJ SOLN
INTRAMUSCULAR | Status: AC
  Filled 2017-09-10: qty 1

## 2017-09-10 MED ORDER — PROPOFOL 10 MG/ML IV BOLUS
INTRAVENOUS | Status: DC | PRN
  Administered 2017-09-10: 80 mg via INTRAVENOUS

## 2017-09-10 MED ORDER — EPHEDRINE SULFATE 50 MG/ML IJ SOLN
INTRAMUSCULAR | Status: DC | PRN
  Administered 2017-09-10 (×2): 5 mg via INTRAVENOUS

## 2017-09-10 MED ORDER — PHENYLEPHRINE HCL 10 MG/ML IJ SOLN
INTRAMUSCULAR | Status: AC
  Filled 2017-09-10: qty 1

## 2017-09-10 MED ORDER — METOPROLOL TARTRATE 50 MG PO TABS: 25.0000 mg | ORAL_TABLET | Freq: Three times a day (TID) | ORAL | 0 refills | Status: DC

## 2017-09-10 NOTE — CV Procedure (Signed)
Electrical Cardioversion Procedure Note Ell Tiso [id number] [birth year]  Procedure: Electrical Cardioversion Indications:  Atrial Flutter  Procedure Details Consent: Risks of procedure as well as the alternatives and risks of each were explained to the (patient/caregiver).  Consent for procedure obtained. Time Out: Verified patient identification, verified procedure, site/side was marked, verified correct patient position, special equipment/implants available, medications/allergies/relevent history reviewed, required imaging and test results available.  Performed  Patient placed on cardiac monitor, pulse oximetry, supplemental oxygen as necessary.  Sedation given: Benzodiazepines and Short-acting barbiturates Pacer pads placed anterior and posterior chest.  Cardioverted 1 time(s).  Cardioverted at [place].  Evaluation Findings: Post procedure EKG shows: NSR Complications: None Patient did tolerate procedure well.   Corey Skains 09/10/2017, 7:51 AM

## 2017-09-10 NOTE — Anesthesia Preprocedure Evaluation (Signed)
Anesthesia Evaluation  Patient identified by MRN, date of birth, ID band Patient awake    Reviewed: Allergy & Precautions, NPO status , Patient's Chart, lab work & pertinent test results  History of Anesthesia Complications Negative for: history of anesthetic complications  Airway Mallampati: II       Dental   Pulmonary neg sleep apnea, neg COPD,           Cardiovascular hypertension, Pt. on medications +CHF (history)  + dysrhythmias Atrial Fibrillation      Neuro/Psych neg Seizures    GI/Hepatic Neg liver ROS, neg GERD  ,  Endo/Other  neg diabetes  Renal/GU negative Renal ROS     Musculoskeletal   Abdominal   Peds  Hematology   Anesthesia Other Findings   Reproductive/Obstetrics                             Anesthesia Physical Anesthesia Plan  ASA: III  Anesthesia Plan: General   Post-op Pain Management:    Induction: Intravenous  PONV Risk Score and Plan: Propofol infusion and Treatment may vary due to age or medical condition  Airway Management Planned:   Additional Equipment:   Intra-op Plan:   Post-operative Plan:   Informed Consent: I have reviewed the patients History and Physical, chart, labs and discussed the procedure including the risks, benefits and alternatives for the proposed anesthesia with the patient or authorized representative who has indicated his/her understanding and acceptance.     Plan Discussed with:   Anesthesia Plan Comments:         Anesthesia Quick Evaluation

## 2017-09-10 NOTE — Anesthesia Post-op Follow-up Note (Signed)
Anesthesia QCDR form completed.        

## 2017-09-10 NOTE — Anesthesia Postprocedure Evaluation (Signed)
Anesthesia Post Note  Patient: Chris Davis  Procedure(s) Performed: Procedure(s) (LRB): Cardioversion (N/A)  Patient location during evaluation: Other Anesthesia Type: General Level of consciousness: awake and alert Pain management: pain level controlled Vital Signs Assessment: post-procedure vital signs reviewed and stable Respiratory status: spontaneous breathing and respiratory function stable Cardiovascular status: stable Anesthetic complications: no     Last Vitals:  Vitals:   09/10/17 0810 09/10/17 0811  BP:  96/64  Pulse: (!) 39 (!) 39  Resp: 11 18  Temp:    SpO2: 96% 94%    Last Pain:  Vitals:   09/10/17 0654  TempSrc: Oral                 KEPHART,WILLIAM K

## 2017-09-10 NOTE — Transfer of Care (Signed)
Immediate Anesthesia Transfer of Care Note  Patient: Chris Davis  Procedure(s) Performed: Procedure(s): Cardioversion (N/A)  Patient Location: PACU  Anesthesia Type:General  Level of Consciousness: awake, alert  and oriented  Airway & Oxygen Therapy: Patient Spontanous Breathing and Patient connected to nasal cannula oxygen  Post-op Assessment: Report given to RN and Post -op Vital signs reviewed and stable  Post vital signs: Reviewed and stable  Last Vitals:  Vitals:   09/10/17 0810 09/10/17 0811  BP:  96/64  Pulse: (!) 39 (!) 39  Resp: 11 18  Temp:    SpO2: 96% 94%    Last Pain:  Vitals:   09/10/17 0654  TempSrc: Oral         Complications: No apparent anesthesia complications

## 2017-09-11 ENCOUNTER — Encounter: Payer: Self-pay | Admitting: Internal Medicine

## 2017-10-13 DIAGNOSIS — R600 Localized edema: Secondary | ICD-10-CM | POA: Insufficient documentation

## 2018-03-12 ENCOUNTER — Emergency Department
Admission: EM | Admit: 2018-03-12 | Discharge: 2018-03-12 | Disposition: A | Discharge status: Home / Self Care | Payer: Medicare Other | Attending: Emergency Medicine | Admitting: Emergency Medicine

## 2018-03-12 ENCOUNTER — Other Ambulatory Visit: Payer: Self-pay

## 2018-03-12 DIAGNOSIS — Z7901 Long term (current) use of anticoagulants: Secondary | ICD-10-CM | POA: Insufficient documentation

## 2018-03-12 DIAGNOSIS — I5022 Chronic systolic (congestive) heart failure: Secondary | ICD-10-CM | POA: Diagnosis not present

## 2018-03-12 DIAGNOSIS — I110 Hypertensive heart disease with heart failure: Secondary | ICD-10-CM | POA: Diagnosis not present

## 2018-03-12 DIAGNOSIS — Z79899 Other long term (current) drug therapy: Secondary | ICD-10-CM | POA: Diagnosis not present

## 2018-03-12 DIAGNOSIS — R040 Epistaxis: Secondary | ICD-10-CM | POA: Diagnosis not present

## 2018-03-12 DIAGNOSIS — Z8546 Personal history of malignant neoplasm of prostate: Secondary | ICD-10-CM | POA: Insufficient documentation

## 2018-03-12 DIAGNOSIS — Z955 Presence of coronary angioplasty implant and graft: Secondary | ICD-10-CM | POA: Insufficient documentation

## 2018-03-12 MED ORDER — SILVER NITRATE-POT NITRATE 75-25 % EX MISC
CUTANEOUS | Status: AC
  Administered 2018-03-12: 1 via TOPICAL
  Filled 2018-03-12: qty 1

## 2018-03-12 MED ORDER — SILVER NITRATE-POT NITRATE 75-25 % EX MISC
1.0000 | Freq: Once | CUTANEOUS | Status: AC
  Administered 2018-03-12: 1 via TOPICAL

## 2018-03-12 MED ORDER — OXYMETAZOLINE HCL 0.05 % NA SOLN
NASAL | Status: AC
  Administered 2018-03-12: 1 via NASAL
  Filled 2018-03-12: qty 15

## 2018-03-12 MED ORDER — OXYMETAZOLINE HCL 0.05 % NA SOLN
1.0000 | Freq: Once | NASAL | Status: AC
  Administered 2018-03-12: 1 via NASAL

## 2018-03-12 NOTE — Discharge Instructions (Signed)
As we discussed if nosebleed recurs please spray 2 sprays of Afrin into the affected nostril and apply a nasal clamp for 15 minutes.  Please follow-up with ENT bleeding recurs.  If nosebleed fails to stop after clamped please return to the emergency department for evaluation.

## 2018-03-12 NOTE — ED Provider Notes (Signed)
[facility] Emergency Department Provider Note  Time seen: 9:39 AM  I have reviewed the triage vital signs and the nursing notes.   HISTORY  Chief Complaint Epistaxis    HPI Loran Fleet is a 84 y.o. male with a past medical history of atrial fibrillation on Eliquis, hypertension, hyperlipidemia, presents to the emergency department for epistaxis.  According to the patient around 6:00 this morning he woke to blood coming out of his right nostril.  States this is a third nosebleed he has had over the past 2 weeks.  The other 2 stopped by themselves at home.  Patient held pressure this morning and went to ENT but nobody was there to see him at ENTs we came to the emergency department.  A clamp was placed upon arrival to the emergency department.  Upon my evaluation the nose bleeding had stopped.  Patient denies any other symptoms such as recent illness, cough, congestion, fever, vomiting, diarrhea, etc.  Negative review of systems.   Past Medical History:  Diagnosis Date  . Arrhythmia    atrial fibrillation  . Hx of radiation therapy   . Hyperlipidemia   . Hypertension   . Prostate cancer ([place])   . Prostate cancer [facility])     Patient Active Problem List   Diagnosis Date Noted  . Chronic systolic heart failure ([place]) 07/31/2017  . Atrial fibrillation ([place]) 07/31/2017  . Acute systolic CHF (congestive heart failure) ([place]) 07/21/2017  . HTN (hypertension) 07/21/2017    Past Surgical History:  Procedure Laterality Date  . CARDIOVERSION N/A 09/10/2017   Procedure: Cardioversion;  Surgeon: Corey Skains, MD;  Location: [facility] ORS;  Service: Cardiovascular;  Laterality: N/A;  . CAROTID ARTERY ANGIOPLASTY    . CATARACT EXTRACTION Bilateral   . INSERTION PROSTATE RADIATION SEED    . TONSILLECTOMY      Prior to Admission medications   Medication Sig Start Date End Date Taking? Authorizing Provider  acetaminophen (TYLENOL) 500 MG tablet Take 500 mg by mouth  every 6 (six) hours as needed (for pain/headaches.).    [provider]  amiodarone (PACERONE) 200 MG tablet Take 400 mg by mouth 2 (two) times daily. 08/27/17   [provider]  apixaban (ELIQUIS) 5 MG TABS tablet Take 1 tablet (5 mg total) by mouth 2 (two) times daily. 07/22/17   Bettey Costa, MD  atorvastatin (LIPITOR) 10 MG tablet Take 10 mg by mouth 2 (two) times daily. 06/04/17   [provider]  Cranberry 500 MG CAPS Take 500 mg by mouth daily with breakfast.    [provider]  furosemide (LASIX) 40 MG tablet Take 40 mg by mouth daily.    [provider]  metoprolol tartrate (LOPRESSOR) 50 MG tablet Take 0.5 tablets (25 mg total) by mouth 3 (three) times daily. 09/10/17   Corey Skains, MD  Multiple Vitamin (MULTIVITAMIN WITH MINERALS) TABS tablet Take 1 tablet by mouth daily.    [provider]  potassium chloride (K-DUR) 10 MEQ tablet Take 1 tablet (10 mEq total) by mouth daily. 07/22/17   Bettey Costa, MD    No Known Allergies  Family History  Problem Relation Age of Onset  . Breast cancer Sister   . Throat cancer Paternal Grandmother     Social History Social History   Tobacco Use  . Smoking status: Never Smoker  . Smokeless tobacco: Never Used  Substance Use Topics  . Alcohol use: Yes    Comment: occ  . Drug  use: No    Review of Systems Constitutional: Negative for fever. Eyes: Negative for visual complaints ENT: Negative for recent illness/congestion Cardiovascular: Negative for chest pain. Respiratory: Negative for shortness of breath. Gastrointestinal: Negative for abdominal pain, vomiting and diarrhea. Genitourinary: Negative for urinary compaints Musculoskeletal: Negative for musculoskeletal complaints Skin: Negative for skin complaints  Neurological: Negative for headache All other ROS negative  ____________________________________________   PHYSICAL EXAM:  VITAL SIGNS: ED Triage Vitals  Enc Vitals  Group     BP 03/12/18 0833 (!) 138/91     Pulse Rate 03/12/18 0833 80     Resp 03/12/18 0833 18     Temp 03/12/18 0833 97.9 F (36.6 C)     Temp Source 03/12/18 0833 Oral     SpO2 03/12/18 0833 100 %     Weight 03/12/18 0834 179 lb (81.2 kg)     Height 03/12/18 0834 5\' 9"  (1.753 m)     Head Circumference --      Peak Flow --      Pain Score --      Pain Loc --      Pain Edu? --      Excl. in [place]? --    Constitutional: Alert and oriented. Well appearing and in no distress. Eyes: Normal exam ENT   Head: Normocephalic and atraumatic.   Nose: Minimal amount of dried blood in the right nostril.   Mouth/Throat: Mucous membranes are moist. Cardiovascular: Irregular rhythm, rate around 80 bpm. Respiratory: Normal respiratory effort without tachypnea nor retractions. Breath sounds are clear  Gastrointestinal: Soft and nontender. No distention.   Musculoskeletal: Nontender with normal range of motion in all extremities. Neurologic:  Normal speech and language. No gross focal neurologic deficits  Skin:  Skin is warm, dry and intact.  Psychiatric: Mood and affect are normal.   ____________________________________________    INITIAL IMPRESSION / ASSESSMENT AND PLAN / ED COURSE  Pertinent labs & imaging results that were available during my care of the patient were reviewed by me and considered in my medical decision making (see chart for details).  Patient presents emergency department for a nosebleed.  This is the patient's third nosebleed in 2 weeks.  Patient takes Eliquis for atrial fibrillation.  Differential includes nosebleed, hypertension, nosebleed due to anticoagulation, nasal irritation, anterior versus posterior nosebleed.  My examination the nosebleed had subsided.  Left nostril is clear, right nostril has a very minimal amount of dried blood.  There is a small what appears to be blood clot on the anterior septum of the right nostril.  I used a silver nitrate stick to  cauterize this area to hopefully prevent any further nosebleeds.  I also provided the patient with Afrin and a nose clamp.  Discussed with the patient using Afrin at home as well as nose clamp at home if bleeding recurs and following up with the ENT.  If it does not subside after clamped to return to the emergency department.  Patient agreeable to this plan of care.  ____________________________________________   FINAL CLINICAL IMPRESSION(S) / ED DIAGNOSES  Epistaxis    Harvest Dark, MD 03/12/18 [phone]

## 2018-03-12 NOTE — ED Notes (Signed)
ED Provider at bedside. 

## 2018-03-12 NOTE — ED Triage Notes (Addendum)
Pt c/o right nare bleeding since 6am, pt is currently taking eliquis .

## 2019-03-09 DIAGNOSIS — D692 Other nonthrombocytopenic purpura: Secondary | ICD-10-CM | POA: Insufficient documentation

## 2019-11-16 ENCOUNTER — Ambulatory Visit: Payer: Medicare Other | Admitting: Nurse Practitioner

## 2019-11-16 ENCOUNTER — Other Ambulatory Visit: Payer: Self-pay

## 2019-11-16 VITALS — BP 114/62 | HR 70 | Temp 97.4°F | Resp 20

## 2019-11-16 DIAGNOSIS — L84 Corns and callosities: Secondary | ICD-10-CM

## 2019-11-16 DIAGNOSIS — H1131 Conjunctival hemorrhage, right eye: Secondary | ICD-10-CM

## 2019-11-16 NOTE — Progress Notes (Signed)
Chris Davis: Patient Care Team: Derinda Late, MD as PCP - General (Family Medicine) Alisa Graff, FNP as Nurse Practitioner (Family Medicine) Corey Skains, MD as Consulting Physician (Cardiology)  Advanced Directive information    No Known Allergies  Chief Complaint  Patient presents with  . Acute Visit    redeye     HPI: Patient is a 85 y.o. male seen in today at the [facility] for red eye. Started 3 or 4 days ago. Not painful.  Had eye exam last feb and reports weak right eye that is chronic. No visual changes- does not see well out of it at baseline. No drainage.  Has not had a red eye like this before and wanted it looked at.  Currently on eliquis due to afib. Bruises easily and has nose bleeds in the past.  Has chronic dry eyes and using sustane eye drops which was approved by ophthalmologist.  Overall feels like the redness has improved since originally occurred.   Has corns on the bottom of his feet and its very hard for him to walk.   Review of Systems:  Review of Systems  Eyes: Positive for redness. Negative for blurred vision, double vision, photophobia, pain and discharge.  Musculoskeletal: Positive for myalgias.    Past Medical History:  Diagnosis Date  . Arrhythmia    atrial fibrillation  . Hx of radiation therapy   . Hyperlipidemia   . Hypertension   . Prostate cancer ([place])   . Prostate cancer [facility])    Past Surgical History:  Procedure Laterality Date  . CARDIOVERSION N/A 09/10/2017   Procedure: Cardioversion;  Surgeon: Corey Skains, MD;  Location: [facility] ORS;  Service: Cardiovascular;  Laterality: N/A;  . CAROTID ARTERY ANGIOPLASTY    . CATARACT EXTRACTION Bilateral   . INSERTION PROSTATE RADIATION SEED    . TONSILLECTOMY     Social History:   reports that he has never smoked. He has never used smokeless tobacco. He reports current alcohol use. He reports that he does not use drugs.  Family History  Problem Relation Age  of Onset  . Breast cancer Sister   . Throat cancer Paternal Grandmother     Medications:PT UNABLE TO VERIFY MEDICATION LIST  Patient's Medications  New Prescriptions   No medications on file  Previous Medications   ACETAMINOPHEN (TYLENOL) 500 MG TABLET    Take 500 mg by mouth every 6 (six) hours as needed (for pain/headaches.).   AMIODARONE (PACERONE) 200 MG TABLET    Take 400 mg by mouth 2 (two) times daily.   APIXABAN (ELIQUIS) 5 MG TABS TABLET    Take 1 tablet (5 mg total) by mouth 2 (two) times daily.   ATORVASTATIN (LIPITOR) 10 MG TABLET    Take 10 mg by mouth 2 (two) times daily.   CRANBERRY 500 MG CAPS    Take 500 mg by mouth daily with breakfast.   FUROSEMIDE (LASIX) 40 MG TABLET    Take 40 mg by mouth daily.   METOPROLOL TARTRATE (LOPRESSOR) 50 MG TABLET    Take 0.5 tablets (25 mg total) by mouth 3 (three) times daily.   MULTIPLE VITAMIN (MULTIVITAMIN WITH MINERALS) TABS TABLET    Take 1 tablet by mouth daily.   POTASSIUM CHLORIDE (K-DUR) 10 MEQ TABLET    Take 1 tablet (10 mEq total) by mouth daily.  Modified Medications   No medications on file  Discontinued Medications   No medications on file  Physical Exam:  Vitals:   11/16/19 1122  BP: 114/62  Pulse: 70  Resp: 20  Temp: (!) 97.4 F (36.3 C)  SpO2: 99%   There is no height or weight on file to calculate BMI. Wt Readings from Last 3 Encounters:  03/12/18 179 lb (81.2 kg)  09/10/17 187 lb (84.8 kg)  07/30/17 192 lb 6 oz (87.3 kg)    Physical Exam Constitutional:      Appearance: Normal appearance.  Eyes:     General: Lids are normal.     Extraocular Movements: Extraocular movements intact.     Conjunctiva/sclera:     Right eye: Hemorrhage present.     Left eye: No hemorrhage.    Pupils: Pupils are equal, round, and reactive to light.  Feet:     Left foot:     Skin integrity: Callus (with corn) present.  Neurological:     Mental Status: He is alert.     Labs reviewed: Basic Metabolic Panel:  No results for input(s): NA, K, CL, CO2, GLUCOSE, BUN, CREATININE, CALCIUM, MG, PHOS, TSH in the last 8760 hours. Liver Function Tests: No results for input(s): AST, ALT, ALKPHOS, BILITOT, PROT, ALBUMIN in the last 8760 hours. No results for input(s): LIPASE, AMYLASE in the last 8760 hours. No results for input(s): AMMONIA in the last 8760 hours. CBC: No results for input(s): WBC, NEUTROABS, HGB, HCT, MCV, PLT in the last 8760 hours. Lipid Panel: No results for input(s): CHOL, HDL, LDLCALC, TRIG, CHOLHDL, LDLDIRECT in the last 8760 hours. TSH: No results for input(s): TSH in the last 8760 hours. A1C: No results found for: HGBA1C   Assessment/Plan 1. Non-traumatic subconjunctival hemorrhage of right eye Noted to right eye, pt without pain, vision changes or discharge. Overall doing much better since first noticed several days ago. Pt will continue to monitor. Advised to seek immediate attention for red eye with pain or blurred vision. Currently without red flag symptoms.  2. Corns and callus -recommended podiatrist evaluation. States he will get referral/recommendatation  from his PCP   [employer]. [place], [facility] [phone]

## 2020-06-03 ENCOUNTER — Other Ambulatory Visit: Payer: Self-pay

## 2020-06-03 ENCOUNTER — Emergency Department (HOSPITAL_COMMUNITY)
Admission: EM | Admit: 2020-06-03 | Discharge: 2020-06-03 | Disposition: A | Discharge status: Home / Self Care | Payer: Medicare PPO | Attending: Emergency Medicine | Admitting: Emergency Medicine

## 2020-06-03 ENCOUNTER — Emergency Department (HOSPITAL_COMMUNITY): Payer: Medicare PPO

## 2020-06-03 DIAGNOSIS — I110 Hypertensive heart disease with heart failure: Secondary | ICD-10-CM | POA: Diagnosis not present

## 2020-06-03 DIAGNOSIS — S0003XA Contusion of scalp, initial encounter: Secondary | ICD-10-CM | POA: Insufficient documentation

## 2020-06-03 DIAGNOSIS — W010XXA Fall on same level from slipping, tripping and stumbling without subsequent striking against object, initial encounter: Secondary | ICD-10-CM | POA: Insufficient documentation

## 2020-06-03 DIAGNOSIS — I5022 Chronic systolic (congestive) heart failure: Secondary | ICD-10-CM | POA: Diagnosis not present

## 2020-06-03 DIAGNOSIS — Z8546 Personal history of malignant neoplasm of prostate: Secondary | ICD-10-CM | POA: Diagnosis not present

## 2020-06-03 DIAGNOSIS — Y92511 Restaurant or cafe as the place of occurrence of the external cause: Secondary | ICD-10-CM | POA: Diagnosis not present

## 2020-06-03 DIAGNOSIS — Y999 Unspecified external cause status: Secondary | ICD-10-CM | POA: Insufficient documentation

## 2020-06-03 DIAGNOSIS — Y9389 Activity, other specified: Secondary | ICD-10-CM | POA: Insufficient documentation

## 2020-06-03 DIAGNOSIS — S0990XA Unspecified injury of head, initial encounter: Secondary | ICD-10-CM | POA: Diagnosis present

## 2020-06-03 DIAGNOSIS — W19XXXA Unspecified fall, initial encounter: Secondary | ICD-10-CM

## 2020-06-03 DIAGNOSIS — Z79899 Other long term (current) drug therapy: Secondary | ICD-10-CM | POA: Insufficient documentation

## 2020-06-03 MED ORDER — BACITRACIN ZINC 500 UNIT/GM EX OINT
TOPICAL_OINTMENT | CUTANEOUS | Status: AC
  Filled 2020-06-03: qty 0.9

## 2020-06-03 NOTE — ED Provider Notes (Signed)
[place] DEPT Provider Note   CSN: [id number] Arrival date & time: 06/03/20  1245     History Chief Complaint  Patient presents with  . Fall  . Head Laceration    on eliquis    Chris Davis is a 86 y.o. male who presents with a fall and head injury. Patient states that he was at [employer] today and tripped and fell onto his left side. He hit the back of his head on a bench. He had some bleeding from the top of his head and is on Eliquis for atrial fibrillation. He denies headache, neck pain, chest pain, shortness of breath, abdominal pain, arm or leg pain. There is no loss of consciousness.   HPI     Past Medical History:  Diagnosis Date  . Arrhythmia    atrial fibrillation  . Hx of radiation therapy   . Hyperlipidemia   . Hypertension   . Prostate cancer ([place])   . Prostate cancer [facility])     Patient Active Problem List   Diagnosis Date Noted  . Chronic systolic heart failure ([place]) 07/31/2017  . Atrial fibrillation ([place]) 07/31/2017  . Acute systolic CHF (congestive heart failure) ([place]) 07/21/2017  . HTN (hypertension) 07/21/2017    Past Surgical History:  Procedure Laterality Date  . CARDIOVERSION N/A 09/10/2017   Procedure: Cardioversion;  Surgeon: Corey Skains, MD;  Location: [facility] ORS;  Service: Cardiovascular;  Laterality: N/A;  . CAROTID ARTERY ANGIOPLASTY    . CATARACT EXTRACTION Bilateral   . INSERTION PROSTATE RADIATION SEED    . TONSILLECTOMY         Family History  Problem Relation Age of Onset  . Breast cancer Sister   . Throat cancer Paternal Grandmother     Social History   Tobacco Use  . Smoking status: Never Smoker  . Smokeless tobacco: Never Used  Substance Use Topics  . Alcohol use: Yes    Comment: occ  . Drug use: No    Home Medications Prior to Admission medications   Medication Sig Start Date End Date Taking? Authorizing Provider  acetaminophen (TYLENOL) 500 MG tablet Take 500 mg by mouth  every 6 (six) hours as needed (for pain/headaches.).    [provider]  amiodarone (PACERONE) 200 MG tablet Take 400 mg by mouth 2 (two) times daily. 08/27/17   [provider]  apixaban (ELIQUIS) 5 MG TABS tablet Take 1 tablet (5 mg total) by mouth 2 (two) times daily. 07/22/17   Bettey Costa, MD  atorvastatin (LIPITOR) 10 MG tablet Take 10 mg by mouth 2 (two) times daily. 06/04/17   [provider]  Cranberry 500 MG CAPS Take 500 mg by mouth daily with breakfast.    [provider]  furosemide (LASIX) 40 MG tablet Take 40 mg by mouth daily.    [provider]  metoprolol tartrate (LOPRESSOR) 50 MG tablet Take 0.5 tablets (25 mg total) by mouth 3 (three) times daily. 09/10/17   Corey Skains, MD  Multiple Vitamin (MULTIVITAMIN WITH MINERALS) TABS tablet Take 1 tablet by mouth daily.    [provider]  potassium chloride (K-DUR) 10 MEQ tablet Take 1 tablet (10 mEq total) by mouth daily. 07/22/17   Bettey Costa, MD    Allergies    Patient has no known allergies.  Review of Systems   Review of Systems  Respiratory: Negative for shortness of breath.   Cardiovascular: Negative for chest pain.  Gastrointestinal: Negative for abdominal  pain.  Musculoskeletal: Negative for arthralgias and back pain.  Skin: Positive for wound.  Neurological: Negative for syncope and headaches.  Hematological: Bruises/bleeds easily.  All other systems reviewed and are negative.   Physical Exam Updated Vital Signs BP 113/61 (BP Location: Left Arm)   Pulse 95   Temp 98.4 F (36.9 C) (Oral)   Resp 18   Ht 5\' 9"  (1.753 m)   Wt 81.2 kg   SpO2 99%   BMI 26.43 kg/m   Physical Exam Vitals and nursing note reviewed.  Constitutional:      General: He is not in acute distress.    Appearance: Normal appearance. He is well-developed. He is not ill-appearing.     Comments: Elderly male in NAD  HENT:     Head: Normocephalic.     Comments: Small hematoma over  the left side of the head with overlying abrasion. No deep laceration noted Eyes:     General: No scleral icterus.       Right eye: No discharge.        Left eye: No discharge.     Conjunctiva/sclera: Conjunctivae normal.     Pupils: Pupils are equal, round, and reactive to light.  Cardiovascular:     Rate and Rhythm: Normal rate and regular rhythm.  Pulmonary:     Effort: Pulmonary effort is normal. No respiratory distress.     Breath sounds: Normal breath sounds. No stridor.  Abdominal:     General: There is no distension.  Musculoskeletal:     Cervical back: Normal range of motion.  Skin:    General: Skin is warm and dry.  Neurological:     Mental Status: He is alert and oriented to person, place, and time.  Psychiatric:        Behavior: Behavior normal.     ED Results / Procedures / Treatments   Labs (all labs ordered are listed, but only abnormal results are displayed) Labs Reviewed - No data to display  EKG None  Radiology CT HEAD WO CONTRAST  Result Date: 06/03/2020 CLINICAL DATA:  Head and neck pain after a fall. EXAM: CT HEAD WITHOUT CONTRAST CT CERVICAL SPINE WITHOUT CONTRAST TECHNIQUE: Multidetector CT imaging of the head and cervical spine was performed following the standard protocol without intravenous contrast. Multiplanar CT image reconstructions of the cervical spine were also generated. COMPARISON:  None. FINDINGS: CT HEAD FINDINGS Brain: No evidence of acute infarction, hemorrhage, hydrocephalus, extra-axial collection or mass lesion/mass effect. There is moderate cerebral volume loss with associated ex vacuo dilatation. Periventricular white matter hypoattenuation likely represents chronic small vessel ischemic disease. Vascular: There are vascular calcifications in the carotid siphons. Skull: Normal. Negative for fracture or focal lesion. Sinuses/Orbits: There is mild right maxillary sinus disease. There is a left mastoid effusion. Other: There is soft tissue  swelling/scalp hematoma overlying the right parietal bone. CT CERVICAL SPINE FINDINGS Alignment: Levocurvature is likely positional. Skull base and vertebrae: No acute fracture. No primary bone lesion or focal pathologic process. Soft tissues and spinal canal: No prevertebral fluid or swelling. No visible canal hematoma. Disc levels: Up to severe multilevel degenerative disc and joint disease. Upper chest: No acute process. Other: None. IMPRESSION: 1. No acute intracranial process. 2. No acute fracture or subluxation of the cervical spine. Electronically Signed   By: Zerita Boers M.D.   On: 06/03/2020 14:00   CT Cervical Spine Wo Contrast  Result Date: 06/03/2020 CLINICAL DATA:  Head and neck pain after a fall. EXAM:  CT HEAD WITHOUT CONTRAST CT CERVICAL SPINE WITHOUT CONTRAST TECHNIQUE: Multidetector CT imaging of the head and cervical spine was performed following the standard protocol without intravenous contrast. Multiplanar CT image reconstructions of the cervical spine were also generated. COMPARISON:  None. FINDINGS: CT HEAD FINDINGS Brain: No evidence of acute infarction, hemorrhage, hydrocephalus, extra-axial collection or mass lesion/mass effect. There is moderate cerebral volume loss with associated ex vacuo dilatation. Periventricular white matter hypoattenuation likely represents chronic small vessel ischemic disease. Vascular: There are vascular calcifications in the carotid siphons. Skull: Normal. Negative for fracture or focal lesion. Sinuses/Orbits: There is mild right maxillary sinus disease. There is a left mastoid effusion. Other: There is soft tissue swelling/scalp hematoma overlying the right parietal bone. CT CERVICAL SPINE FINDINGS Alignment: Levocurvature is likely positional. Skull base and vertebrae: No acute fracture. No primary bone lesion or focal pathologic process. Soft tissues and spinal canal: No prevertebral fluid or swelling. No visible canal hematoma. Disc levels: Up to  severe multilevel degenerative disc and joint disease. Upper chest: No acute process. Other: None. IMPRESSION: 1. No acute intracranial process. 2. No acute fracture or subluxation of the cervical spine. Electronically Signed   By: Zerita Boers M.D.   On: 06/03/2020 14:00    Procedures Procedures (including critical care time)  Medications Ordered in ED Medications - No data to display  ED Course  I have reviewed the triage vital signs and the nursing notes.  Pertinent labs & imaging results that were available during my care of the patient were reviewed by me and considered in my medical decision making (see chart for details).  86 year old male presents with a fall on blood thinners. He has no significant complaints. He does have a small hematoma on the top of his head with overlying abrasion that does not require staples. CT head and C-spine were obtained in triage and are negative. He denies any complaints. Pt advised on local wound care and to return if worsening  MDM Rules/Calculators/A&P                           Final Clinical Impression(s) / ED Diagnoses Final diagnoses:  Fall, initial encounter  Hematoma of scalp, initial encounter    Rx / DC Orders ED Discharge Orders    None       Recardo Evangelist, PA-C 06/03/20 1607    Lajean Saver, MD 06/04/20 1315

## 2020-06-03 NOTE — ED Notes (Signed)
Pt elects to go home and have wife put neosporin on it, declined dressing.

## 2020-06-03 NOTE — Discharge Instructions (Signed)
Keep a bandage over the wound on your head until it heals Apply ice to the head a couple times a day for swelling and pain Take Tylenol if needed for pain Please return if you are worsening

## 2020-06-03 NOTE — ED Triage Notes (Signed)
Per patient he was walking into [employer] and tripped on thresh hold of door and fell. Patient states he hit back of head on bench. Denies pain. Denies LOC. Patient is on eliquis for 3-4 years now. Patient has small laceration to right side of scalp. Bleeding controlled at this time.

## 2020-08-15 ENCOUNTER — Emergency Department
Admission: EM | Admit: 2020-08-15 | Discharge: 2020-08-15 | Disposition: A | Discharge status: Home / Self Care | Payer: Medicare PPO | Attending: Emergency Medicine | Admitting: Emergency Medicine

## 2020-08-15 ENCOUNTER — Encounter: Payer: Self-pay | Admitting: Emergency Medicine

## 2020-08-15 ENCOUNTER — Emergency Department: Payer: Medicare PPO

## 2020-08-15 ENCOUNTER — Other Ambulatory Visit: Payer: Self-pay

## 2020-08-15 DIAGNOSIS — Z7901 Long term (current) use of anticoagulants: Secondary | ICD-10-CM | POA: Diagnosis not present

## 2020-08-15 DIAGNOSIS — I110 Hypertensive heart disease with heart failure: Secondary | ICD-10-CM | POA: Diagnosis not present

## 2020-08-15 DIAGNOSIS — S0101XA Laceration without foreign body of scalp, initial encounter: Secondary | ICD-10-CM | POA: Diagnosis present

## 2020-08-15 DIAGNOSIS — I5022 Chronic systolic (congestive) heart failure: Secondary | ICD-10-CM | POA: Diagnosis not present

## 2020-08-15 DIAGNOSIS — Y999 Unspecified external cause status: Secondary | ICD-10-CM | POA: Diagnosis not present

## 2020-08-15 DIAGNOSIS — W010XXA Fall on same level from slipping, tripping and stumbling without subsequent striking against object, initial encounter: Secondary | ICD-10-CM | POA: Insufficient documentation

## 2020-08-15 DIAGNOSIS — Y939 Activity, unspecified: Secondary | ICD-10-CM | POA: Diagnosis not present

## 2020-08-15 DIAGNOSIS — Z79899 Other long term (current) drug therapy: Secondary | ICD-10-CM | POA: Diagnosis not present

## 2020-08-15 DIAGNOSIS — I4891 Unspecified atrial fibrillation: Secondary | ICD-10-CM | POA: Diagnosis not present

## 2020-08-15 DIAGNOSIS — Y929 Unspecified place or not applicable: Secondary | ICD-10-CM | POA: Diagnosis not present

## 2020-08-15 DIAGNOSIS — W19XXXA Unspecified fall, initial encounter: Secondary | ICD-10-CM

## 2020-08-15 DIAGNOSIS — Z8546 Personal history of malignant neoplasm of prostate: Secondary | ICD-10-CM | POA: Insufficient documentation

## 2020-08-15 NOTE — ED Provider Notes (Signed)
[facility] Emergency Department Provider Note  ____________________________________________  Time seen: Approximately 1:32 PM  I have reviewed the triage vital signs and the nursing notes.   HISTORY  Chief Complaint Fall    HPI Chris Davis is a 86 y.o. male with a history of atrial fibrillation, hypertension hyperlipidemia prostate cancer and CHF who comes the ED complaining of a trip and fall this morning at about 5:30 AM resulting in him hitting his head on a bedside table.  He denies lightheadedness or chest pain, no preceding symptoms.  Feels that it was mechanical.  No loss of consciousness.  Denies neck pain or headache.  No vision changes or arm weakness or paresthesia or other acute complaints.  Prior to the fall he felt he was in his usual state of health.  He recently was started on furosemide for lower extremity edema and has been noticing good improvement.  He weighs himself every day.      Past Medical History:  Diagnosis Date  . Arrhythmia    atrial fibrillation  . Hx of radiation therapy   . Hyperlipidemia   . Hypertension   . Prostate cancer ([place])   . Prostate cancer [facility])      Patient Active Problem List   Diagnosis Date Noted  . Chronic systolic heart failure ([place]) 07/31/2017  . Atrial fibrillation ([place]) 07/31/2017  . Acute systolic CHF (congestive heart failure) ([place]) 07/21/2017  . HTN (hypertension) 07/21/2017     Past Surgical History:  Procedure Laterality Date  . CARDIOVERSION N/A 09/10/2017   Procedure: Cardioversion;  Surgeon: Corey Skains, MD;  Location: [facility] ORS;  Service: Cardiovascular;  Laterality: N/A;  . CAROTID ARTERY ANGIOPLASTY    . CATARACT EXTRACTION Bilateral   . INSERTION PROSTATE RADIATION SEED    . TONSILLECTOMY       Prior to Admission medications   Medication Sig Start Date End Date Taking? Authorizing Provider  acetaminophen (TYLENOL) 500 MG tablet Take 500 mg by mouth every 6 (six) hours  as needed (for pain/headaches.).    [provider]  amiodarone (PACERONE) 200 MG tablet Take 400 mg by mouth 2 (two) times daily. 08/27/17   [provider]  apixaban (ELIQUIS) 5 MG TABS tablet Take 1 tablet (5 mg total) by mouth 2 (two) times daily. 07/22/17   Bettey Costa, MD  atorvastatin (LIPITOR) 10 MG tablet Take 10 mg by mouth 2 (two) times daily. 06/04/17   [provider]  Cranberry 500 MG CAPS Take 500 mg by mouth daily with breakfast.    [provider]  furosemide (LASIX) 40 MG tablet Take 40 mg by mouth daily.    [provider]  metoprolol tartrate (LOPRESSOR) 50 MG tablet Take 0.5 tablets (25 mg total) by mouth 3 (three) times daily. 09/10/17   Corey Skains, MD  Multiple Vitamin (MULTIVITAMIN WITH MINERALS) TABS tablet Take 1 tablet by mouth daily.    [provider]  potassium chloride (K-DUR) 10 MEQ tablet Take 1 tablet (10 mEq total) by mouth daily. 07/22/17   Bettey Costa, MD     Allergies Patient has no known allergies.   Family History  Problem Relation Age of Onset  . Breast cancer Sister   . Throat cancer Paternal Grandmother     Social History Social History   Tobacco Use  . Smoking status: Never Smoker  . Smokeless tobacco: Never Used  Substance Use Topics  . Alcohol use: Yes    Comment: occ  .  Drug use: No    Review of Systems  Constitutional:   No fever or chills.  ENT:   No sore throat. No rhinorrhea. Cardiovascular:   No chest pain or syncope. Respiratory:   No dyspnea or cough. Gastrointestinal:   Negative for abdominal pain, vomiting and diarrhea.  Musculoskeletal:   Negative for focal pain or swelling All other systems reviewed and are negative except as documented above in ROS and HPI.  ____________________________________________   PHYSICAL EXAM:  VITAL SIGNS: ED Triage Vitals [08/15/20 0640]  Enc Vitals Group     BP (!) 92/50     Pulse Rate 67     Resp 18     Temp (!) 97.5 F  (36.4 C)     Temp Source Oral     SpO2 100 %     Weight 177 lb (80.3 kg)     Height 5\' 5"  (1.651 m)     Head Circumference      Peak Flow      Pain Score 0     Pain Loc      Pain Edu?      Excl. in [place]?     Vital signs reviewed, nursing assessments reviewed.   Constitutional:   Alert and oriented. Non-toxic appearance. Eyes:   Conjunctivae are normal. EOMI. PERRL. ENT      Head:   Normocephalic with 1 cm laceration to left parietal scalp.  Hemostatic.  No debris in the wound or foreign body.  No battle sign or raccoon eyes.  No otorrhea or rhinorrhea..      Nose: Normal.      Mouth/Throat: Normal, no dental injury      Neck:   No meningismus. Full ROM. Hematological/Lymphatic/Immunilogical:   No cervical lymphadenopathy. Cardiovascular:   RRR. Symmetric bilateral radial and DP pulses.  No murmurs. Cap refill less than 2 seconds. Respiratory:   Normal respiratory effort without tachypnea/retractions. Breath sounds are clear and equal bilaterally. No wheezes/rales/rhonchi. Gastrointestinal:   Soft and nontender. Non distended. There is no CVA tenderness.  No rebound, rigidity, or guarding.  Musculoskeletal:   Normal range of motion in all extremities. No joint effusions.  No lower extremity tenderness.  2+ pitting edema bilateral lower extremities, symmetric calf circumference. Neurologic:   Normal speech and language.  Motor grossly intact. No acute focal neurologic deficits are appreciated.  Skin:    Skin is warm, dry and intact. No rash noted.  No petechiae, purpura, or bullae.  ____________________________________________    LABS (pertinent positives/negatives) (all labs ordered are listed, but only abnormal results are displayed) Labs Reviewed - No data to display ____________________________________________   EKG    ____________________________________________    RADIOLOGY  CT Head Wo Contrast  Result Date: 08/15/2020 CLINICAL DATA:  Head trauma, minor.  Additional provided: Fall, patient on Eliquis. EXAM: CT HEAD WITHOUT CONTRAST CT CERVICAL SPINE WITHOUT CONTRAST TECHNIQUE: Multidetector CT imaging of the head and cervical spine was performed following the standard protocol without intravenous contrast. Multiplanar CT image reconstructions of the cervical spine were also generated. COMPARISON:  CT head/cervical spine 06/03/2020 FINDINGS: CT HEAD FINDINGS Brain: Stable, moderate generalized parenchymal atrophy. Stable, mild ill-defined hypoattenuation within the cerebral white matter which is nonspecific, but consistent with chronic small vessel ischemic disease. There is no acute intracranial hemorrhage. No demarcated cortical infarct. No extra-axial fluid collection. No evidence of intracranial mass. No midline shift. Vascular: No hyperdense vessel.  Atherosclerotic calcifications Skull: Normal. Negative for fracture or focal lesion. Sinuses/Orbits: Visualized  orbits show no acute finding. Mild paranasal sinus mucosal thickening, most notably ethmoidal. Frothy secretions within a posterior right ethmoid air cell. Left mastoid effusion. Other: Left frontal scalp/forehead soft tissue swelling. CT CERVICAL SPINE FINDINGS Alignment: Cervical levocurvature. Minimal C2-C3 grade 1 retrolisthesis, C3-C4 grade 1 anterolisthesis, C4-C5 grade 1 retrolisthesis. Skull base and vertebrae: The basion-dental and atlanto-dental intervals are maintained.No evidence of acute fracture to the cervical spine. Soft tissues and spinal canal: No prevertebral fluid or swelling. No visible canal hematoma. Disc levels: Cervical spondylosis with multilevel disc space narrowing, disc bulges, uncovertebral and facet hypertrophy. There is fusion across the C3-C4 facet joint on the right. Upper chest: No consolidation within the imaged lung apices. No visible pneumothorax. IMPRESSION: CT head: 1. No evidence of acute intracranial abnormality. 2. Left frontal scalp/forehead soft tissue swelling.  3. Stable moderate generalized parenchymal atrophy and mild chronic small vessel ischemic disease. 4. Ethmoid sinusitis. 5. Left mastoid effusion. CT cervical spine: 1. No evidence of acute fracture to the cervical spine. 2. Mild multilevel grade 1 spondylolisthesis as detailed. 3. Cervical levocurvature. 4. Cervical spondylosis as described. Electronically Signed   By: Kellie Simmering DO   On: 08/15/2020 07:49   CT Cervical Spine Wo Contrast  Result Date: 08/15/2020 CLINICAL DATA:  Head trauma, minor. Additional provided: Fall, patient on Eliquis. EXAM: CT HEAD WITHOUT CONTRAST CT CERVICAL SPINE WITHOUT CONTRAST TECHNIQUE: Multidetector CT imaging of the head and cervical spine was performed following the standard protocol without intravenous contrast. Multiplanar CT image reconstructions of the cervical spine were also generated. COMPARISON:  CT head/cervical spine 06/03/2020 FINDINGS: CT HEAD FINDINGS Brain: Stable, moderate generalized parenchymal atrophy. Stable, mild ill-defined hypoattenuation within the cerebral white matter which is nonspecific, but consistent with chronic small vessel ischemic disease. There is no acute intracranial hemorrhage. No demarcated cortical infarct. No extra-axial fluid collection. No evidence of intracranial mass. No midline shift. Vascular: No hyperdense vessel.  Atherosclerotic calcifications Skull: Normal. Negative for fracture or focal lesion. Sinuses/Orbits: Visualized orbits show no acute finding. Mild paranasal sinus mucosal thickening, most notably ethmoidal. Frothy secretions within a posterior right ethmoid air cell. Left mastoid effusion. Other: Left frontal scalp/forehead soft tissue swelling. CT CERVICAL SPINE FINDINGS Alignment: Cervical levocurvature. Minimal C2-C3 grade 1 retrolisthesis, C3-C4 grade 1 anterolisthesis, C4-C5 grade 1 retrolisthesis. Skull base and vertebrae: The basion-dental and atlanto-dental intervals are maintained.No evidence of acute  fracture to the cervical spine. Soft tissues and spinal canal: No prevertebral fluid or swelling. No visible canal hematoma. Disc levels: Cervical spondylosis with multilevel disc space narrowing, disc bulges, uncovertebral and facet hypertrophy. There is fusion across the C3-C4 facet joint on the right. Upper chest: No consolidation within the imaged lung apices. No visible pneumothorax. IMPRESSION: CT head: 1. No evidence of acute intracranial abnormality. 2. Left frontal scalp/forehead soft tissue swelling. 3. Stable moderate generalized parenchymal atrophy and mild chronic small vessel ischemic disease. 4. Ethmoid sinusitis. 5. Left mastoid effusion. CT cervical spine: 1. No evidence of acute fracture to the cervical spine. 2. Mild multilevel grade 1 spondylolisthesis as detailed. 3. Cervical levocurvature. 4. Cervical spondylosis as described. Electronically Signed   By: Kellie Simmering DO   On: 08/15/2020 07:49    ____________________________________________   PROCEDURES .Marland KitchenLaceration Repair  Date/Time: 08/15/2020 1:41 PM Performed by: Carrie Mew, MD Authorized by: Carrie Mew, MD   Consent:    Consent obtained:  Verbal   Consent given by:  Patient   Risks discussed:  Infection, retained foreign body, pain, poor wound healing  and poor cosmetic result   Alternatives discussed:  No treatment Anesthesia (see MAR for exact dosages):    Anesthesia method:  None Laceration details:    Location:  Scalp   Scalp location:  L parietal   Length (cm):  1 Repair type:    Repair type:  Simple Pre-procedure details:    Preparation:  Patient was prepped and draped in usual sterile fashion and imaging obtained to evaluate for foreign bodies Exploration:    Wound exploration: entire depth of wound probed and visualized     Wound extent: no fascia violation noted, no foreign bodies/material noted, no muscle damage noted, no nerve damage noted, no underlying fracture noted and no vascular  damage noted     Contaminated: no   Treatment:    Area cleansed with:  Saline   Amount of cleaning:  Standard   Visualized foreign bodies/material removed: no   Skin repair:    Repair method:  Tissue adhesive Approximation:    Approximation:  Close Post-procedure details:    Dressing:  Open (no dressing)   Patient tolerance of procedure:  Tolerated well, no immediate complications    ____________________________________________  DIFFERENTIAL DIAGNOSIS   Cerebral hemorrhage, subdural hematoma, epidural hematoma, C-spine fracture,  CLINICAL IMPRESSION / ASSESSMENT AND PLAN / ED COURSE  Medications ordered in the ED: Medications - No data to display  Pertinent labs & imaging results that were available during my care of the patient were reviewed by me and considered in my medical decision making (see chart for details).  Chris Davis was evaluated in Emergency Department on 08/15/2020 for the symptoms described in the history of present illness. He was evaluated in the context of the global COVID-19 pandemic, which necessitated consideration that the patient might be at risk for infection with the SARS-CoV-2 virus that causes COVID-19. Institutional protocols and algorithms that pertain to the evaluation of patients at risk for COVID-19 are in a state of rapid change based on information released by regulatory bodies including the CDC and federal and state organizations. These policies and algorithms were followed during the patient's care in the ED.   Patient presents with mechanical fall and head injury.  He is on Eliquis for atrial fibrillation.  CT scan of the head and neck obtained which is negative for intracranial hemorrhage or C-spine injury.  Wound repaired with tissue adhesive after cleaning.  Patient symptoms have resolved, is at baseline state of health, vital signs and rest of exam are reassuring, stable for discharge home.       ____________________________________________   FINAL CLINICAL IMPRESSION(S) / ED DIAGNOSES    Final diagnoses:  Laceration of scalp, initial encounter  Fall in home, initial encounter     ED Discharge Orders    None      Portions of this note were generated with dragon dictation software. Dictation errors may occur despite best attempts at proofreading.   Carrie Mew, MD 08/15/20 1343

## 2020-08-15 NOTE — ED Notes (Addendum)
See triage note, pt reports slipped and tripped while going to the bathroom this morning, hit head on bedside table. Denies LOC. Takes eliquis.  Laceration noted to left side of forehead, bleeding controlled at this time.  Swelling/pitting edema noted to BLE Wife at bedside Dr Joni Fears at bedside

## 2020-08-15 NOTE — ED Triage Notes (Addendum)
Pt wife, Benjamine Mola [phone] She is in the car in the parking lot

## 2020-08-15 NOTE — ED Triage Notes (Signed)
Pt to triage via w/c with no distress noted; EMS brought pt in from [facility] for fall this am; st lost balance going to BR; denies pain or LOC; lac to left side scalp with no active bleeding

## 2020-09-14 DIAGNOSIS — N1832 Chronic kidney disease, stage 3b: Secondary | ICD-10-CM | POA: Insufficient documentation

## 2020-11-02 ENCOUNTER — Emergency Department
Admission: EM | Admit: 2020-11-02 | Discharge: 2020-11-02 | Disposition: A | Discharge status: Home / Self Care | Payer: Medicare PPO | Attending: Emergency Medicine | Admitting: Emergency Medicine

## 2020-11-02 ENCOUNTER — Emergency Department: Payer: Medicare PPO

## 2020-11-02 ENCOUNTER — Other Ambulatory Visit: Payer: Self-pay

## 2020-11-02 DIAGNOSIS — I110 Hypertensive heart disease with heart failure: Secondary | ICD-10-CM | POA: Insufficient documentation

## 2020-11-02 DIAGNOSIS — S0101XA Laceration without foreign body of scalp, initial encounter: Secondary | ICD-10-CM | POA: Insufficient documentation

## 2020-11-02 DIAGNOSIS — I5021 Acute systolic (congestive) heart failure: Secondary | ICD-10-CM | POA: Diagnosis not present

## 2020-11-02 DIAGNOSIS — E869 Volume depletion, unspecified: Secondary | ICD-10-CM | POA: Diagnosis not present

## 2020-11-02 DIAGNOSIS — W19XXXA Unspecified fall, initial encounter: Secondary | ICD-10-CM

## 2020-11-02 DIAGNOSIS — W01198A Fall on same level from slipping, tripping and stumbling with subsequent striking against other object, initial encounter: Secondary | ICD-10-CM | POA: Insufficient documentation

## 2020-11-02 DIAGNOSIS — Z79899 Other long term (current) drug therapy: Secondary | ICD-10-CM | POA: Insufficient documentation

## 2020-11-02 DIAGNOSIS — Y92009 Unspecified place in unspecified non-institutional (private) residence as the place of occurrence of the external cause: Secondary | ICD-10-CM | POA: Insufficient documentation

## 2020-11-02 DIAGNOSIS — I4891 Unspecified atrial fibrillation: Secondary | ICD-10-CM | POA: Insufficient documentation

## 2020-11-02 DIAGNOSIS — Z7901 Long term (current) use of anticoagulants: Secondary | ICD-10-CM | POA: Insufficient documentation

## 2020-11-02 DIAGNOSIS — Z8546 Personal history of malignant neoplasm of prostate: Secondary | ICD-10-CM | POA: Diagnosis not present

## 2020-11-02 DIAGNOSIS — S0990XA Unspecified injury of head, initial encounter: Secondary | ICD-10-CM

## 2020-11-02 LAB — BASIC METABOLIC PANEL
Anion gap: 11 (ref 5–15)
BUN: 45 mg/dL — ABNORMAL HIGH (ref 8–23)
CO2: 23 mmol/L (ref 22–32)
Calcium: 8.8 mg/dL — ABNORMAL LOW (ref 8.9–10.3)
Chloride: 97 mmol/L — ABNORMAL LOW (ref 98–111)
Creatinine, Ser: 1.34 mg/dL — ABNORMAL HIGH (ref 0.61–1.24)
GFR, Estimated: 52 mL/min — ABNORMAL LOW (ref >60)
Glucose, Bld: 78 mg/dL (ref 70–99)
Potassium: 4.4 mmol/L (ref 3.5–5.1)
Sodium: 131 mmol/L — ABNORMAL LOW (ref 135–145)

## 2020-11-02 LAB — URINALYSIS, COMPLETE (UACMP) WITH MICROSCOPIC
Bacteria, UA: NONE SEEN
Bilirubin Urine: NEGATIVE
Glucose, UA: >=500 mg/dL — AB
Hgb urine dipstick: NEGATIVE
Ketones, ur: NEGATIVE mg/dL
Leukocytes,Ua: NEGATIVE
Nitrite: NEGATIVE
Protein, ur: NEGATIVE mg/dL
Specific Gravity, Urine: 1.013 (ref 1.005–1.030)
pH: 6 (ref 5.0–8.0)

## 2020-11-02 LAB — CBC
HCT: 42.6 % (ref 39.0–52.0)
Hemoglobin: 14.2 g/dL (ref 13.0–17.0)
MCH: 30.7 pg (ref 26.0–34.0)
MCHC: 33.3 g/dL (ref 30.0–36.0)
MCV: 92 fL (ref 80.0–100.0)
Platelets: 166 10*3/uL (ref 150–400)
RBC: 4.63 MIL/uL (ref 4.22–5.81)
RDW: 14.6 % (ref 11.5–15.5)
WBC: 7.2 10*3/uL (ref 4.0–10.5)
nRBC: 0.3 % — ABNORMAL HIGH (ref 0.0–0.2)

## 2020-11-02 LAB — TROPONIN I (HIGH SENSITIVITY): Troponin I (High Sensitivity): 9 ng/L (ref <18)

## 2020-11-02 NOTE — ED Provider Notes (Signed)
[facility] Emergency Department Provider Note  ____________________________________________   First MD Initiated Contact with Patient 11/02/20 [phone]     (approximate)  I have reviewed the triage vital signs and the nursing notes.   HISTORY  Chief Complaint Fall    HPI Chris Davis is a 86 y.o. male with a history of A. fib on Eliquis who presents by EMS for evaluation after a fall.  The patient reports to me that he stumbled when he got up as result of the shoes that he has to wear and fell backwards, striking a chair with the back of his head.  He states that he did not lose consciousness and has no significant headache or neck pain.  However he states that because he is on Eliquis, he had a significant amount of bleeding and EMS was called and put a pressure dressing on his head.  He has felt fine since then.  He denies chest pain, shortness of breath, nausea, vomiting, abdominal pain, and any injuries to his arms or legs.  He said he briefly felt dizzy after striking his head but he no longer feels that way.  The onset was acute and the episode was severe and nothing in particular made him feel better or worse.  He has not had any recent fever or chills, sore throat, nor dysuria.         Past Medical History:  Diagnosis Date  . Arrhythmia    atrial fibrillation  . Hx of radiation therapy   . Hyperlipidemia   . Hypertension   . Prostate cancer ([place])   . Prostate cancer [facility])     Patient Active Problem List   Diagnosis Date Noted  . Chronic systolic heart failure ([place]) 07/31/2017  . Atrial fibrillation ([place]) 07/31/2017  . Acute systolic CHF (congestive heart failure) ([place]) 07/21/2017  . HTN (hypertension) 07/21/2017    Past Surgical History:  Procedure Laterality Date  . CARDIOVERSION N/A 09/10/2017   Procedure: Cardioversion;  Surgeon: Corey Skains, MD;  Location: [facility] ORS;  Service: Cardiovascular;  Laterality: N/A;  . CAROTID ARTERY  ANGIOPLASTY    . CATARACT EXTRACTION Bilateral   . INSERTION PROSTATE RADIATION SEED    . TONSILLECTOMY      Prior to Admission medications   Medication Sig Start Date End Date Taking? Authorizing Provider  acetaminophen (TYLENOL) 500 MG tablet Take 500 mg by mouth every 6 (six) hours as needed (for pain/headaches.).    [provider]  amiodarone (PACERONE) 200 MG tablet Take 400 mg by mouth 2 (two) times daily. 08/27/17   [provider]  apixaban (ELIQUIS) 5 MG TABS tablet Take 1 tablet (5 mg total) by mouth 2 (two) times daily. 07/22/17   Bettey Costa, MD  atorvastatin (LIPITOR) 10 MG tablet Take 10 mg by mouth 2 (two) times daily. 06/04/17   [provider]  Cranberry 500 MG CAPS Take 500 mg by mouth daily with breakfast.    [provider]  furosemide (LASIX) 40 MG tablet Take 40 mg by mouth daily.    [provider]  metoprolol tartrate (LOPRESSOR) 50 MG tablet Take 0.5 tablets (25 mg total) by mouth 3 (three) times daily. 09/10/17   Corey Skains, MD  Multiple Vitamin (MULTIVITAMIN WITH MINERALS) TABS tablet Take 1 tablet by mouth daily.    [provider]  potassium chloride (K-DUR) 10 MEQ tablet Take 1 tablet (10 mEq total) by mouth daily. 07/22/17   Bettey Costa, MD  Allergies Patient has no known allergies.  Family History  Problem Relation Age of Onset  . Breast cancer Sister   . Throat cancer Paternal Grandmother     Social History Social History   Tobacco Use  . Smoking status: Never Smoker  . Smokeless tobacco: Never Used  Substance Use Topics  . Alcohol use: Yes    Comment: occ  . Drug use: No    Review of Systems Constitutional: No fever/chills Eyes: No visual changes. ENT: No sore throat. Cardiovascular: Denies chest pain. Respiratory: Denies shortness of breath. Gastrointestinal: No abdominal pain.  No nausea, no vomiting.  No diarrhea.  No constipation. Genitourinary: Negative for  dysuria. Musculoskeletal: Negative for neck pain.  Negative for back pain. Integumentary: Laceration to the back of head with contusion.  Negative for rash. Neurological: Negative for headaches, focal weakness or numbness.   ____________________________________________   PHYSICAL EXAM:  VITAL SIGNS: ED Triage Vitals  Enc Vitals Group     BP 11/02/20 0433 (!) 105/58     Pulse Rate 11/02/20 0433 82     Resp 11/02/20 0433 15     Temp 11/02/20 0433 97.8 F (36.6 C)     Temp src --      SpO2 11/02/20 0419 99 %     Weight 11/02/20 0433 80.3 kg (177 lb 0.5 oz)     Height 11/02/20 0433 1.651 m (5\' 5" )     Head Circumference --      Peak Flow --      Pain Score 11/02/20 0433 3     Pain Loc --      Pain Edu? --      Excl. in [place]? --     Constitutional: Alert and oriented.  Cheerful, conversant, no acute distress. Eyes: Conjunctivae are normal.  Head: Patient has approximately a 1 cm laceration to the back of his head that has well-controlled bleeding at this time.  There is a surrounding area of contusion but no significant hematoma nor fluctuance.  Mild tenderness palpation. Nose: No congestion/rhinnorhea. Mouth/Throat: Patient is wearing a mask. Neck: No stridor.  No meningeal signs.   Cardiovascular: Normal rate, regular rhythm. Good peripheral circulation. Grossly normal heart sounds. Respiratory: Normal respiratory effort.  No retractions. Gastrointestinal: Soft and nontender. No distention.  Musculoskeletal: Patient has no tenderness to palpation of the cervical spine and no pain/tenderness with flexion, extension, nor rotation side to side neck.  No lower extremity tenderness nor edema. No gross deformities of extremities.  No evidence of extremity injuries. Neurologic:  Normal speech and language. No gross focal neurologic deficits are appreciated.  Skin:  Skin is warm, dry and intact. Psychiatric: Mood and affect are normal. Speech and behavior are  normal.  ____________________________________________   LABS (all labs ordered are listed, but only abnormal results are displayed)  Labs Reviewed  BASIC METABOLIC PANEL - Abnormal; Notable for the following components:      Result Value   Sodium 131 (*)    Chloride 97 (*)    BUN 45 (*)    Creatinine, Ser 1.34 (*)    Calcium 8.8 (*)    GFR, Estimated 52 (*)    All other components within normal limits  CBC - Abnormal; Notable for the following components:   nRBC 0.3 (*)    All other components within normal limits  URINALYSIS, COMPLETE (UACMP) WITH MICROSCOPIC - Abnormal; Notable for the following components:   Color, Urine YELLOW (*)    APPearance CLEAR (*)  Glucose, UA >=500 (*)    All other components within normal limits  TROPONIN I (HIGH SENSITIVITY)  TROPONIN I (HIGH SENSITIVITY)   ____________________________________________  EKG  ED ECG REPORT I, Hinda Kehr, the attending physician, personally viewed and interpreted this ECG.  Date: 11/02/2020 EKG Time: 4:40 AM Rate: 78 Rhythm: Atrial fibrillation QRS Axis: Right superior axis deviation Intervals: Incomplete right bundle branch block ST/T Wave abnormalities: Non-specific ST segment / T-wave changes, but no clear evidence of acute ischemia. Narrative Interpretation: no definitive evidence of acute ischemia; does not meet STEMI criteria.   ____________________________________________  RADIOLOGY I, Hinda Kehr, personally viewed and evaluated these images (plain radiographs) as part of my medical decision making, as well as reviewing the written report by the radiologist.  ED MD interpretation: No evidence of intracranial injury  Official radiology report(s): CT Head Wo Contrast  Result Date: 11/02/2020 CLINICAL DATA:  Minor head trauma EXAM: CT HEAD WITHOUT CONTRAST TECHNIQUE: Contiguous axial images were obtained from the base of the skull through the vertex without intravenous contrast. COMPARISON:   08/15/2020 FINDINGS: Brain: No evidence of acute infarction, hemorrhage, hydrocephalus, extra-axial collection or mass lesion/mass effect. Prominent and generalized atrophy. Vascular: No hyperdense vessel or unexpected calcification. Skull: No fracture. Sinuses/Orbits: No evidence of injury. IMPRESSION: No evidence of intracranial injury. Electronically Signed   By: Monte Fantasia M.D.   On: 11/02/2020 05:36    ____________________________________________   PROCEDURES   Procedure(s) performed (including Critical Care):  Marland KitchenMarland KitchenLaceration Repair  Date/Time: 11/02/2020 6:45 AM Performed by: Hinda Kehr, MD Authorized by: Hinda Kehr, MD   Consent:    Consent obtained:  Verbal   Consent given by:  Patient Anesthesia (see MAR for exact dosages):    Anesthesia method:  None Laceration details:    Location:  Scalp   Scalp location:  Occipital   Length (cm):  1 Repair type:    Repair type:  Simple Exploration:    Contaminated: no   Treatment:    Amount of cleaning:  Standard   Irrigation solution:  Sterile saline   Visualized foreign bodies/material removed: no   Skin repair:    Repair method:  Tissue adhesive Approximation:    Approximation:  Close Post-procedure details:    Dressing:  Open (no dressing)   Patient tolerance of procedure:  Tolerated well, no immediate complications     ____________________________________________   INITIAL IMPRESSION / MDM / ASSESSMENT AND PLAN / ED COURSE  As part of my medical decision making, I reviewed the following data within the [facility] notes reviewed and incorporated, Labs reviewed , EKG interpreted , Old EKG reviewed and Notes from prior ED visits   Differential diagnosis includes, but is not limited to, contusion, laceration, skull fracture, intracranial bleeding, cervical spine injury, cardiac arrhythmia, acute infectious process.  The patient is well-appearing, pleasant, and in no distress.  He  is alert and oriented and is able to give me a good history.  He is essentially asymptomatic at this time other than some mild tenderness to the back of his scalp where he has a contusion and small laceration which I was able to repair with Dermabond.  No indication for cervical spine imaging based on Nexus criteria and he has a reassuring exam overall.  No evidence of ischemia on EKG and his EKG is consistent with his prior EKG showing atrial fibrillation.  Vital signs are stable.  CMP is normal, urinalysis is negative for infection, high-sensitivity troponin is normal and the  patient has had no chest pain and is at low risk for ACS; there is no indication for repeat labs.  Basic metabolic panel shows some mild hyponatremia and a slight elevation of his creatinine but the last values we have on record are from years ago and they are roughly equivalent.  He may be somewhat volume depleted but he is able to tolerate oral intake without difficulty and does not need IV hydration.  I gave the patient my usual and customary management recommendations and follow-up recommendations and return precautions and he understands and agrees with the plan and is eager to get back home.  He knows to follow-up as an outpatient.           ____________________________________________  FINAL CLINICAL IMPRESSION(S) / ED DIAGNOSES  Final diagnoses:  Fall, initial encounter  Minor head injury, initial encounter  Laceration of scalp, initial encounter  Atrial fibrillation, unspecified type ([place])  Volume depletion     MEDICATIONS GIVEN DURING THIS VISIT:  Medications - No data to display   ED Discharge Orders    None      *Please note:  Keedan Sample was evaluated in Emergency Department on 11/02/2020 for the symptoms described in the history of present illness. He was evaluated in the context of the global COVID-19 pandemic, which necessitated consideration that the patient might be at risk for infection with  the SARS-CoV-2 virus that causes COVID-19. Institutional protocols and algorithms that pertain to the evaluation of patients at risk for COVID-19 are in a state of rapid change based on information released by regulatory bodies including the CDC and federal and state organizations. These policies and algorithms were followed during the patient's care in the ED.  Some ED evaluations and interventions may be delayed as a result of limited staffing during and after the pandemic.*  Note:  This document was prepared using Dragon voice recognition software and may include unintentional dictation errors.   Hinda Kehr, MD 11/02/20 [phone]

## 2020-11-02 NOTE — Discharge Instructions (Addendum)
You were evaluated in the emergency department tonight after a fall.  Fortunately your exam was reassuring.  Your labs suggest that you may be a little bit volume depleted (mildly dehydrated) so we encourage you to drink plenty of clear fluids such as water, Gatorade, etc.  We repaired the laceration on the back of your head with some skin glue and recommend that you keep it dry for at least 24 hours.  It will eventually fall off on its own but after 24 hours you can again gently wash your hair in the shower without any difficulty.  Continue on your regular medications, stay hydrated, and call your primary care doctor for follow-up appointment.  Return to the emergency department if you develop new or worsening symptoms that concern you.

## 2020-11-02 NOTE — ED Triage Notes (Signed)
Patient reports falling while attempting to use urinal this morning. Patient is unsure if he was dizzy prior to fall - states: "It all happened so fast". Patient with bleeding abrasion to posterior head. Patient takes eliquis daily. Patient AO X 4. Pupils equal and reactive.

## 2020-11-02 NOTE — ED Triage Notes (Signed)
EMS brings pt in from home; pt to lobby via w/c with no distress noted; reports mechanical fall, abrasion to occipitut; currently taking eloquist; no LOC

## 2020-11-28 DIAGNOSIS — R42 Dizziness and giddiness: Secondary | ICD-10-CM | POA: Insufficient documentation

## 2020-12-16 ENCOUNTER — Inpatient Hospital Stay
Admission: EM | Admit: 2020-12-16 | Discharge: 2020-12-18 | DRG: 314 | Disposition: A | Discharge status: Home Health | Payer: Medicare PPO | Attending: Internal Medicine | Admitting: Internal Medicine

## 2020-12-16 ENCOUNTER — Other Ambulatory Visit: Payer: Self-pay

## 2020-12-16 DIAGNOSIS — Z923 Personal history of irradiation: Secondary | ICD-10-CM

## 2020-12-16 DIAGNOSIS — I872 Venous insufficiency (chronic) (peripheral): Secondary | ICD-10-CM | POA: Diagnosis present

## 2020-12-16 DIAGNOSIS — I130 Hypertensive heart and chronic kidney disease with heart failure and stage 1 through stage 4 chronic kidney disease, or unspecified chronic kidney disease: Secondary | ICD-10-CM | POA: Diagnosis present

## 2020-12-16 DIAGNOSIS — I4891 Unspecified atrial fibrillation: Secondary | ICD-10-CM | POA: Diagnosis present

## 2020-12-16 DIAGNOSIS — I959 Hypotension, unspecified: Secondary | ICD-10-CM | POA: Diagnosis not present

## 2020-12-16 DIAGNOSIS — N179 Acute kidney failure, unspecified: Secondary | ICD-10-CM | POA: Diagnosis not present

## 2020-12-16 DIAGNOSIS — Z20822 Contact with and (suspected) exposure to covid-19: Secondary | ICD-10-CM | POA: Diagnosis present

## 2020-12-16 DIAGNOSIS — R55 Syncope and collapse: Principal | ICD-10-CM | POA: Diagnosis present

## 2020-12-16 DIAGNOSIS — R531 Weakness: Secondary | ICD-10-CM | POA: Diagnosis not present

## 2020-12-16 DIAGNOSIS — E860 Dehydration: Secondary | ICD-10-CM | POA: Diagnosis present

## 2020-12-16 DIAGNOSIS — L03119 Cellulitis of unspecified part of limb: Secondary | ICD-10-CM | POA: Diagnosis present

## 2020-12-16 DIAGNOSIS — Z7901 Long term (current) use of anticoagulants: Secondary | ICD-10-CM

## 2020-12-16 DIAGNOSIS — I5022 Chronic systolic (congestive) heart failure: Secondary | ICD-10-CM | POA: Diagnosis present

## 2020-12-16 DIAGNOSIS — L03115 Cellulitis of right lower limb: Secondary | ICD-10-CM | POA: Diagnosis present

## 2020-12-16 DIAGNOSIS — I9589 Other hypotension: Secondary | ICD-10-CM | POA: Diagnosis not present

## 2020-12-16 DIAGNOSIS — N1831 Chronic kidney disease, stage 3a: Secondary | ICD-10-CM | POA: Diagnosis present

## 2020-12-16 DIAGNOSIS — Z8546 Personal history of malignant neoplasm of prostate: Secondary | ICD-10-CM

## 2020-12-16 DIAGNOSIS — E785 Hyperlipidemia, unspecified: Secondary | ICD-10-CM | POA: Diagnosis present

## 2020-12-16 DIAGNOSIS — L03116 Cellulitis of left lower limb: Secondary | ICD-10-CM | POA: Diagnosis present

## 2020-12-16 DIAGNOSIS — N170 Acute kidney failure with tubular necrosis: Secondary | ICD-10-CM | POA: Diagnosis present

## 2020-12-16 DIAGNOSIS — Z79899 Other long term (current) drug therapy: Secondary | ICD-10-CM

## 2020-12-16 DIAGNOSIS — Z803 Family history of malignant neoplasm of breast: Secondary | ICD-10-CM

## 2020-12-16 DIAGNOSIS — I10 Essential (primary) hypertension: Secondary | ICD-10-CM

## 2020-12-16 DIAGNOSIS — I878 Other specified disorders of veins: Secondary | ICD-10-CM | POA: Diagnosis present

## 2020-12-16 LAB — BASIC METABOLIC PANEL
Anion gap: 10 (ref 5–15)
BUN: 71 mg/dL — ABNORMAL HIGH (ref 8–23)
CO2: 21 mmol/L — ABNORMAL LOW (ref 22–32)
Calcium: 9 mg/dL (ref 8.9–10.3)
Chloride: 99 mmol/L (ref 98–111)
Creatinine, Ser: 1.6 mg/dL — ABNORMAL HIGH (ref 0.61–1.24)
GFR, Estimated: 42 mL/min — ABNORMAL LOW (ref >60)
Glucose, Bld: 91 mg/dL (ref 70–99)
Potassium: 4.9 mmol/L (ref 3.5–5.1)
Sodium: 130 mmol/L — ABNORMAL LOW (ref 135–145)

## 2020-12-16 LAB — URINALYSIS, COMPLETE (UACMP) WITH MICROSCOPIC
Bacteria, UA: NONE SEEN
Bilirubin Urine: NEGATIVE
Glucose, UA: >=500 mg/dL — AB
Hgb urine dipstick: NEGATIVE
Ketones, ur: NEGATIVE mg/dL
Leukocytes,Ua: NEGATIVE
Nitrite: NEGATIVE
Protein, ur: NEGATIVE mg/dL
Specific Gravity, Urine: 1.013 (ref 1.005–1.030)
Squamous Epithelial / HPF: NONE SEEN (ref 0–5)
pH: 6 (ref 5.0–8.0)

## 2020-12-16 LAB — CBC
HCT: 40.4 % (ref 39.0–52.0)
Hemoglobin: 13.7 g/dL (ref 13.0–17.0)
MCH: 30.1 pg (ref 26.0–34.0)
MCHC: 33.9 g/dL (ref 30.0–36.0)
MCV: 88.8 fL (ref 80.0–100.0)
Platelets: 209 10*3/uL (ref 150–400)
RBC: 4.55 MIL/uL (ref 4.22–5.81)
RDW: 15.1 % (ref 11.5–15.5)
WBC: 7.6 10*3/uL (ref 4.0–10.5)
nRBC: 0 % (ref 0.0–0.2)

## 2020-12-16 LAB — HEPATIC FUNCTION PANEL
ALT: 20 U/L (ref 0–44)
AST: 36 U/L (ref 15–41)
Albumin: 3.3 g/dL — ABNORMAL LOW (ref 3.5–5.0)
Alkaline Phosphatase: 62 U/L (ref 38–126)
Bilirubin, Direct: 0.3 mg/dL — ABNORMAL HIGH (ref 0.0–0.2)
Indirect Bilirubin: 1.3 mg/dL — ABNORMAL HIGH (ref 0.3–0.9)
Total Bilirubin: 1.6 mg/dL — ABNORMAL HIGH (ref 0.3–1.2)
Total Protein: 6.2 g/dL — ABNORMAL LOW (ref 6.5–8.1)

## 2020-12-16 LAB — LACTIC ACID, PLASMA
Lactic Acid, Venous: 2.3 mmol/L (ref 0.5–1.9)
Lactic Acid, Venous: 1.9 mmol/L (ref 0.5–1.9)

## 2020-12-16 LAB — BRAIN NATRIURETIC PEPTIDE: B Natriuretic Peptide: 432.3 pg/mL — ABNORMAL HIGH (ref 0.0–100.0)

## 2020-12-16 LAB — PROCALCITONIN: Procalcitonin: <0.1 ng/mL

## 2020-12-16 LAB — TROPONIN I (HIGH SENSITIVITY): Troponin I (High Sensitivity): 15 ng/L (ref <18)

## 2020-12-16 LAB — POC SARS CORONAVIRUS 2 AG -  ED: SARS Coronavirus 2 Ag: NEGATIVE

## 2020-12-16 LAB — CORTISOL: Cortisol, Plasma: 18.4 ug/dL

## 2020-12-16 LAB — LIPASE, BLOOD: Lipase: 53 U/L — ABNORMAL HIGH (ref 11–51)

## 2020-12-16 LAB — CK: Total CK: 377 U/L (ref 49–397)

## 2020-12-16 MED ORDER — SODIUM CHLORIDE 0.9 % IV SOLN
2.0000 g | Freq: Two times a day (BID) | INTRAVENOUS | Status: DC
  Administered 2020-12-17 – 2020-12-18 (×3): 2 g via INTRAVENOUS
  Filled 2020-12-16 (×4): qty 2

## 2020-12-16 MED ORDER — VANCOMYCIN HCL IN DEXTROSE 1-5 GM/200ML-% IV SOLN: 1000.0000 mg | Freq: Once | INTRAVENOUS | Status: DC

## 2020-12-16 MED ORDER — SODIUM CHLORIDE 0.9 % IV BOLUS
1000.0000 mL | Freq: Once | INTRAVENOUS | Status: AC
  Administered 2020-12-16: 1000 mL via INTRAVENOUS

## 2020-12-16 MED ORDER — SODIUM CHLORIDE 0.9 % IV SOLN
2.0000 g | Freq: Once | INTRAVENOUS | Status: AC
  Administered 2020-12-16: 2 g via INTRAVENOUS
  Filled 2020-12-16: qty 2

## 2020-12-16 MED ORDER — SODIUM CHLORIDE 0.9 % IV SOLN: INTRAVENOUS | Status: DC

## 2020-12-16 MED ORDER — APIXABAN 5 MG PO TABS
5.0000 mg | ORAL_TABLET | Freq: Two times a day (BID) | ORAL | Status: DC
  Administered 2020-12-16 – 2020-12-18 (×4): 5 mg via ORAL
  Filled 2020-12-16 (×4): qty 1

## 2020-12-16 MED ORDER — ONDANSETRON HCL 4 MG/2ML IJ SOLN
4.0000 mg | Freq: Three times a day (TID) | INTRAMUSCULAR | Status: DC | PRN
  Administered 2020-12-17: 4 mg via INTRAVENOUS
  Filled 2020-12-16: qty 2

## 2020-12-16 MED ORDER — SODIUM CHLORIDE 0.9 % IV BOLUS
500.0000 mL | Freq: Once | INTRAVENOUS | Status: AC
  Administered 2020-12-16: 500 mL via INTRAVENOUS

## 2020-12-16 MED ORDER — ATORVASTATIN CALCIUM 20 MG PO TABS
20.0000 mg | ORAL_TABLET | Freq: Every day | ORAL | Status: DC
  Administered 2020-12-16 – 2020-12-18 (×3): 20 mg via ORAL
  Filled 2020-12-16 (×3): qty 1

## 2020-12-16 MED ORDER — ACETAMINOPHEN 325 MG PO TABS
650.0000 mg | ORAL_TABLET | Freq: Four times a day (QID) | ORAL | Status: DC | PRN
  Administered 2020-12-16 – 2020-12-18 (×3): 650 mg via ORAL
  Filled 2020-12-16 (×4): qty 2

## 2020-12-16 MED ORDER — VANCOMYCIN HCL IN DEXTROSE 1-5 GM/200ML-% IV SOLN
1000.0000 mg | INTRAVENOUS | Status: DC
  Filled 2020-12-16: qty 200

## 2020-12-16 MED ORDER — HYDROCORTISONE NA SUCCINATE PF 100 MG IJ SOLR
100.0000 mg | Freq: Once | INTRAMUSCULAR | Status: AC
  Administered 2020-12-16: 100 mg via INTRAVENOUS
  Filled 2020-12-16: qty 2

## 2020-12-16 NOTE — ED Notes (Signed)
Pt requesting 325mg  of Tylenol for his chronic back pain. Dr. , MD made aware.

## 2020-12-16 NOTE — ED Notes (Signed)
Pt brief soiled with urine. External cath placed on pt at this time. Pt given a phone to call wife at this time.

## 2020-12-16 NOTE — Progress Notes (Signed)
Pharmacy Antibiotic Note  Chris Davis is a 87 y.o. male admitted on 12/16/2020 with cellulitis.  Pharmacy has been consulted for Cefepime and Vancomycin dosing.  Plan: Vancomycin 2g IV loading dose followed by Vancomycin 1g IV q24h.  Cefepime 2g IV q12h  Follow renal function, per MD note baseline SCr is 1.0 to 1.3.  Height: 5\' 7"  (170.2 cm) Weight: 80.3 kg (177 lb) IBW/kg (Calculated) : 66.1  Temp (24hrs), Avg:98.3 F (36.8 C), Min:98.3 F (36.8 C), Max:98.3 F (36.8 C)  Recent Labs  Lab 12/16/20 0841  WBC 7.6  CREATININE 1.60*    Estimated Creatinine Clearance: 33.7 mL/min (A) (by C-G formula based on SCr of 1.6 mg/dL (H)).    No Known Allergies  Antimicrobials this admission: Cefepime 1/1 >>  Vancomcyin 1/1 >>   Dose adjustments this admission:  Microbiology results:  Thank you for allowing pharmacy to be a part of this patient's care.  02/13/21, PharmD, BCPS 12/16/2020 7:10 PM

## 2020-12-16 NOTE — ED Notes (Signed)
Pt repositioned in the bed at this time.  

## 2020-12-16 NOTE — ED Triage Notes (Signed)
Pt states that his wife helped him onto a mattress onto the floor after getting dizzy last night. Slept on mattress on floor at home. Did NOT fall. Dizziness started it.

## 2020-12-16 NOTE — ED Notes (Signed)
Pt given meal tray. This tech set meal tray up for me. Pt able to feed self. Call light within reach. Pt has no further needs at this time.

## 2020-12-16 NOTE — ED Notes (Signed)
Pt lunch meal tray given at this time. 

## 2020-12-16 NOTE — H&P (Signed)
History and Physical    Chris Davis YHO:[id number] DOB: 1932/12/10 DOA: 12/16/2020  Referring MD/NP/PA:   PCP: Derinda Late, MD   Patient coming from:  The patient is coming from home.  At baseline, pt is independent for most of ADL.        Chief Complaint: Generalized weakness, lightheadedness, leg pain and leg swelling  HPI: Chris Davis is a 87 y.o. male with medical history significant of hypertension, hyperlipidemia, diabetes mellitus, prostate cancer (s/p of radiation therapy), atrial fibrillation on Eliquis, sCHF with EF of 40%, CKD stage IIIa, who presents with generalized weakness, lightheadedness, leg pain, leg swelling.  Patient states that he has been feeling weak and lightheaded since last night. He states that he slide down the fridge and sat in the floor all night. He clarified that he did not fall and did not have any injury. He states that he has bilateral leg swelling and weeping for a while, which has worsened in the past several days. His legs are erythematous and tender, the left leg is worse than the right.  Patient does not have chest pain, shortness breath, cough, fever or chills. No nausea vomiting, diarrhea, abdominal pain, symptoms of UTI. No unilateral numbness or tingling in his extremities.  Patient was initially hypotensive with blood pressure 78/44, which improved to 93/61 after giving 2 L normal saline bolus in ED.  ED Course: pt was found to have WBC 7.6, troponin level 15, pending COVID-19 PCR, CK 377, worsening renal function, negative urinalysis, BMP 432, lipase 53, temperature 98.3, heart rate 99, RR 20, oxygen saturation 98-100% on room air. Patient is placed on MedSurg bed for observation   Review of Systems:   General: no fevers, chills, no body weight gain, has fatigue HEENT: no blurry vision, hearing changes or sore throat Respiratory: no dyspnea, coughing, wheezing CV: no chest pain, no palpitations GI: no nausea, vomiting, abdominal pain,  diarrhea, constipation GU: no dysuria, burning on urination, increased urinary frequency, hematuria  Ext: has leg edema and pain Neuro: no unilateral weakness, numbness, or tingling, no vision change or hearing loss. Has lightheadedness. Skin: no rash, no skin tear. MSK: No muscle spasm, no deformity, no limitation of range of movement in spin Heme: No easy bruising.  Travel history: No recent long distant travel.  Allergy: No Known Allergies  Past Medical History:  Diagnosis Date  . Arrhythmia    atrial fibrillation  . Hx of radiation therapy   . Hyperlipidemia   . Hypertension   . Prostate cancer ([place])   . Prostate cancer [facility])     Past Surgical History:  Procedure Laterality Date  . CARDIOVERSION N/A 09/10/2017   Procedure: Cardioversion;  Surgeon: Corey Skains, MD;  Location: [facility] ORS;  Service: Cardiovascular;  Laterality: N/A;  . CAROTID ARTERY ANGIOPLASTY    . CATARACT EXTRACTION Bilateral   . INSERTION PROSTATE RADIATION SEED    . TONSILLECTOMY      Social History:  reports that he has never smoked. He has never used smokeless tobacco. He reports current alcohol use. He reports that he does not use drugs.  Family History:  Family History  Problem Relation Age of Onset  . Breast cancer Sister   . Throat cancer Paternal Grandmother      Prior to Admission medications   Medication Sig Start Date End Date Taking? Authorizing Provider  acetaminophen (TYLENOL) 500 MG tablet Take 500 mg by mouth every 6 (six) hours as needed (for pain/headaches.).    [provider]  amiodarone (PACERONE) 200 MG tablet Take 400 mg by mouth 2 (two) times daily. 08/27/17   [provider]  apixaban (ELIQUIS) 5 MG TABS tablet Take 1 tablet (5 mg total) by mouth 2 (two) times daily. 07/22/17   Bettey Costa, MD  atorvastatin (LIPITOR) 10 MG tablet Take 10 mg by mouth 2 (two) times daily. 06/04/17   [provider]  Cranberry 500 MG CAPS Take 500 mg by mouth daily  with breakfast.    [provider]  furosemide (LASIX) 40 MG tablet Take 40 mg by mouth daily.    [provider]  metoprolol tartrate (LOPRESSOR) 50 MG tablet Take 0.5 tablets (25 mg total) by mouth 3 (three) times daily. 09/10/17   Corey Skains, MD  Multiple Vitamin (MULTIVITAMIN WITH MINERALS) TABS tablet Take 1 tablet by mouth daily.    [provider]  potassium chloride (K-DUR) 10 MEQ tablet Take 1 tablet (10 mEq total) by mouth daily. 07/22/17   Bettey Costa, MD    Physical Exam: Vitals:   12/16/20 1535 12/16/20 1615 12/16/20 1630 12/16/20 1740  BP: (!) 82/66 (!) 73/43 93/61 (!) 91/55  Pulse: 74 64 84 74  Resp: _0 Temp:      TempSrc:      SpO2: 98% 100% 100% 99%  Weight:      Height:       General: Not in acute distress HEENT:       Eyes: PERRL, EOMI, no scleral icterus.       ENT: No discharge from the ears and nose, no pharynx injection, no tonsillar enlargement.        Neck: No JVD, no bruit, no mass felt. Heme: No neck lymph node enlargement. Cardiac: S1/S2, RRR, No murmurs, No gallops or rubs. Respiratory: No rales, wheezing, rhonchi or rubs. GI: Soft, nondistended, nontender, no rebound pain, no organomegaly, BS present. GU: No hematuria Ext: 1+DP/PT pulse bilaterally. Has swelling, erythema, warmth and tenderness in both legs, left leg is worse than the right.     Musculoskeletal: No joint deformities, No joint redness or warmth, no limitation of ROM in spin. Skin: No rashes.  Neuro: Alert, oriented X3, cranial nerves II-XII grossly intact, moves all extremities normally. Psych: Patient is not psychotic, no suicidal or hemocidal ideation.  Labs on Admission: I have personally reviewed following labs and imaging studies  CBC: Recent Labs  Lab 12/16/20 0841  WBC 7.6  HGB 13.7  HCT 40.4  MCV 88.8  PLT 638   Basic Metabolic Panel: Recent Labs  Lab 12/16/20 0841  NA 130*  K 4.9  CL 99  CO2 21*  GLUCOSE 91  BUN  71*  CREATININE 1.60*  CALCIUM 9.0   GFR: Estimated Creatinine Clearance: 33.7 mL/min (A) (by C-G formula based on SCr of 1.6 mg/dL (H)). Liver Function Tests: Recent Labs  Lab 12/16/20 0841  AST 36  ALT 20  ALKPHOS 62  BILITOT 1.6*  PROT 6.2*  ALBUMIN 3.3*   Recent Labs  Lab 12/16/20 0841  LIPASE 53*   No results for input(s): AMMONIA in the last 168 hours. Coagulation Profile: No results for input(s): INR, PROTIME in the last 168 hours. Cardiac Enzymes: Recent Labs  Lab 12/16/20 0841  CKTOTAL 377   BNP (last 3 results) No results for input(s): PROBNP in the last 8760 hours. HbA1C: No results for input(s): HGBA1C in the last 72 hours. CBG: No results for input(s): GLUCAP in the last  168 hours. Lipid Profile: No results for input(s): CHOL, HDL, LDLCALC, TRIG, CHOLHDL, LDLDIRECT in the last 72 hours. Thyroid Function Tests: No results for input(s): TSH, T4TOTAL, FREET4, T3FREE, THYROIDAB in the last 72 hours. Anemia Panel: No results for input(s): VITAMINB12, FOLATE, FERRITIN, TIBC, IRON, RETICCTPCT in the last 72 hours. Urine analysis:    Component Value Date/Time   COLORURINE YELLOW (A) 12/16/2020 1422   APPEARANCEUR CLEAR (A) 12/16/2020 1422   LABSPEC 1.013 12/16/2020 1422   PHURINE 6.0 12/16/2020 1422   GLUCOSEU >=500 (A) 12/16/2020 1422   HGBUR NEGATIVE 12/16/2020 1422   BILIRUBINUR NEGATIVE 12/16/2020 1422   KETONESUR NEGATIVE 12/16/2020 1422   PROTEINUR NEGATIVE 12/16/2020 1422   NITRITE NEGATIVE 12/16/2020 1422   LEUKOCYTESUR NEGATIVE 12/16/2020 1422   Sepsis Labs: _0 (procalcitonin:4,lacticidven:4) )No results found for this or any previous visit (from the past 240 hour(s)).   Radiological Exams on Admission: No results found.   EKG: I have personally reviewed. Atrial fibrillation, QTc 485, poor R wave progression, poor quality of EKG strips  Assessment/Plan Principal Problem:   Cellulitis of lower extremity Active Problems:   HTN  (hypertension)   Chronic systolic heart failure (HCC)   Atrial fibrillation (HCC)   Hypotension   HLD (hyperlipidemia)   Acute renal failure superimposed on stage 3a chronic kidney disease (HCC)   Cellulitis of lower extremity: Patient has hypotension, no fever or leukocytosis.  Does not meet criteria for sepsis currently, but patient is at high risk of developing sepsis.  Blood pressure 78/44, which improved to 91/55 after giving 2 L normal saline bolus.  - Placed on MedSurg bed for observation - Empiric antimicrobial treatment with vancomycin and cefepime - PRN Zofran for nausea - Blood cultures x 2  - ESR and CRP - will get Procalcitonin and trend lactic acid levels per sepsis protocol. - IVF: 3 L of NS bolus, followed by 75 cc/h  HTN (hypertension) -Hold blood pressure medications due to hypotension  Chronic systolic heart failure ([place]): 2D echo on 07/15/2019 showed EF of 40%.  Patient has bilateral leg edema, no respiratory distress.  BNP 432, does not seem to have CHF exacerbation, but the patient is at high risk of developing CHF exacerbation -Watch volume status closely -Hold diuretics due to hypotension  Atrial fibrillation (HCC) -Continue Eliquis -Hold metoprolol due to hypotension  Hypotension: Likely due to combination of dehydration and cellulitis -Blood pressure improving with IV fluid as above -100 mg of Solu-Cortef will be given after sending out cortisol level  HLD (hyperlipidemia) -Lipitor  Acute renal failure superimposed on stage 3a chronic kidney disease ([place]): Baseline creatinine 1.0-1.3.  His creatinine is 1.60, BUN 71.  Likely multifactorial etiology, including dehydration, ATN secondary to hypotension and continuation of lisinopril, diuretics. -Hold lisinopril, diuretics -IV fluid as above -Avoid using renal toxic medications  Jardiance use: Patient is taking Jardiance, but he denies history of diabetes.  His blood sugar is 91 -Hold  Jardiance       DVT ppx: On Eliquis Code Status: Full code per her daughter Family Communication:  Yes, patient's daughter by phone Disposition Plan:  Anticipate discharge back to previous environment Consults called:  none Admission status: Med-surg bed for obs  Status is: Observation  The patient remains OBS appropriate and will d/c before 2 midnights.  Dispo: The patient is from: Home              Anticipated d/c is to: Home  Anticipated d/c date is: 1 day              Patient currently is not medically stable to d/c.          Date of Service 12/16/2020    Ivor Costa [facility]   If 7PM-7AM, please contact night-coverage [email or web address] 12/16/2020, 6:47 PM

## 2020-12-16 NOTE — ED Provider Notes (Signed)
[facility] Emergency Department Provider Note  Time seen: 10:05 AM  I have reviewed the triage vital signs and the nursing notes.   HISTORY  Chief Complaint Lightheadedness, dizziness  HPI Chris Davis is a 87 y.o. male with a past medical history of atrial fibrillation, hypertension, hyperlipidemia, CHF, presents to the emergency department for generalized fatigue and weakness.  According to the patient yesterday he became very weak and slid down to the ground.  Wife pulled the mattress onto the ground so the patient could sleep on the ground.  He continued to feel weak this morning so he came to the emergency department for evaluation.  Patient's initial blood pressure is 87/52.  Patient denies any nausea vomiting or diarrhea.  Denies dysuria.  Denies fever cough or shortness of breath.  Largely negative review of systems.   Past Medical History:  Diagnosis Date  . Arrhythmia    atrial fibrillation  . Hx of radiation therapy   . Hyperlipidemia   . Hypertension   . Prostate cancer ([place])   . Prostate cancer [facility])     Patient Active Problem List   Diagnosis Date Noted  . Chronic systolic heart failure ([place]) 07/31/2017  . Atrial fibrillation ([place]) 07/31/2017  . Acute systolic CHF (congestive heart failure) ([place]) 07/21/2017  . HTN (hypertension) 07/21/2017    Past Surgical History:  Procedure Laterality Date  . CARDIOVERSION N/A 09/10/2017   Procedure: Cardioversion;  Surgeon: Corey Skains, MD;  Location: [facility] ORS;  Service: Cardiovascular;  Laterality: N/A;  . CAROTID ARTERY ANGIOPLASTY    . CATARACT EXTRACTION Bilateral   . INSERTION PROSTATE RADIATION SEED    . TONSILLECTOMY      Prior to Admission medications   Medication Sig Start Date End Date Taking? Authorizing Provider  acetaminophen (TYLENOL) 500 MG tablet Take 500 mg by mouth every 6 (six) hours as needed (for pain/headaches.).    [provider]  amiodarone (PACERONE) 200 MG  tablet Take 400 mg by mouth 2 (two) times daily. 08/27/17   [provider]  apixaban (ELIQUIS) 5 MG TABS tablet Take 1 tablet (5 mg total) by mouth 2 (two) times daily. 07/22/17   Bettey Costa, MD  atorvastatin (LIPITOR) 10 MG tablet Take 10 mg by mouth 2 (two) times daily. 06/04/17   [provider]  Cranberry 500 MG CAPS Take 500 mg by mouth daily with breakfast.    [provider]  furosemide (LASIX) 40 MG tablet Take 40 mg by mouth daily.    [provider]  metoprolol tartrate (LOPRESSOR) 50 MG tablet Take 0.5 tablets (25 mg total) by mouth 3 (three) times daily. 09/10/17   Corey Skains, MD  Multiple Vitamin (MULTIVITAMIN WITH MINERALS) TABS tablet Take 1 tablet by mouth daily.    [provider]  potassium chloride (K-DUR) 10 MEQ tablet Take 1 tablet (10 mEq total) by mouth daily. 07/22/17   Bettey Costa, MD    No Known Allergies  Family History  Problem Relation Age of Onset  . Breast cancer Sister   . Throat cancer Paternal Grandmother     Social History Social History   Tobacco Use  . Smoking status: Never Smoker  . Smokeless tobacco: Never Used  Substance Use Topics  . Alcohol use: Yes    Comment: occ  . Drug use: No    Review of Systems Constitutional: Negative for fever.  Positive for generalized fatigue/weakness. Cardiovascular: Negative for chest pain. Respiratory: Negative for shortness of  breath. Gastrointestinal: Negative for abdominal pain, vomiting and diarrhea. Genitourinary: Negative for urinary compaints Musculoskeletal: Negative for musculoskeletal complaints Neurological: Negative for headache All other ROS negative  ____________________________________________   PHYSICAL EXAM:  VITAL SIGNS: ED Triage Vitals  Enc Vitals Group     BP 12/16/20 0835 (!) 78/44     Pulse Rate 12/16/20 0832 61     Resp 12/16/20 0832 18     Temp 12/16/20 0832 98.3 F (36.8 C)     Temp Source 12/16/20 0832 Oral     SpO2  12/16/20 0832 98 %     Weight 12/16/20 0833 177 lb (80.3 kg)     Height 12/16/20 0833 5\' 7"  (1.702 m)     Head Circumference --      Peak Flow --      Pain Score 12/16/20 0833 7     Pain Loc --      Pain Edu? --      Excl. in [place]? --    Constitutional: Alert and oriented. Well appearing and in no distress. Eyes: Normal exam ENT      Head: Normocephalic and atraumatic.      Mouth/Throat: Mucous membranes are moist. Cardiovascular: Normal rate, regular rhythm.  Respiratory: Normal respiratory effort without tachypnea nor retractions. Breath sounds are clear  Gastrointestinal: Soft and nontender. No distention. Musculoskeletal: Nontender with normal range of motion in all extremities.  2+ lower extremity edema bilaterally. Neurologic:  Normal speech and language. No gross focal neurologic deficits Skin:  Skin is warm, dry  Psychiatric: Mood and affect are normal.   ____________________________________________    EKG  EKG viewed and interpreted by myself shows atrial fibrillation at 99 bpm with a narrow QRS, normal axis, normal intervals, no concerning ST changes.  ____________________________________________   INITIAL IMPRESSION / ASSESSMENT AND PLAN / ED COURSE  Pertinent labs & imaging results that were available during my care of the patient were reviewed by me and considered in my medical decision making (see chart for details).   Patient presents emergency department for generalized fatigue and weakness since last night.  Overall the patient appears well.  Blood pressure improving with fluids has received approximately 500 cc of fluid thus far and current blood pressure is [id number] systolic.  I have added on cardiac enzymes as well as a CK.  We will continue with IV hydration and check a Covid swab as a precaution.  Patient agreeable to plan of care.  Urinalysis pending.  Patient's blood pressure initially came up with IV fluids however has now begun to decrease once again  currently 81/53.  Lab work shows no significant findings troponin is negative.  Covid test negative.  Patient does have acute renal insufficiency compared to his baseline.  We will continue with IV hydration.  We will admit to the hospital service for further work-up and treatment.  Hiroto Kemna was evaluated in Emergency Department on 12/16/2020 for the symptoms described in the history of present illness. He was evaluated in the context of the global COVID-19 pandemic, which necessitated consideration that the patient might be at risk for infection with the SARS-CoV-2 virus that causes COVID-19. Institutional protocols and algorithms that pertain to the evaluation of patients at risk for COVID-19 are in a state of rapid change based on information released by regulatory bodies including the CDC and federal and state organizations. These policies and algorithms were followed during the patient's care in the ED.  ____________________________________________   FINAL CLINICAL IMPRESSION(S) / ED  DIAGNOSES  Weakness Hypotension Acute kidney injury   Minna Antis, MD 12/16/20 1311

## 2020-12-16 NOTE — ED Notes (Signed)
Dinner tray given to pt at this time.

## 2020-12-16 NOTE — ED Triage Notes (Signed)
First RN Note: pt to ED via [facility] with c/o fall from home, per EMS pt's wife states he slid down the fridge and sat in the floor all night. Per EMS pt c/o back pain at this time.      HR 80-110 A-fib  97% RA 103/62

## 2020-12-17 DIAGNOSIS — Z923 Personal history of irradiation: Secondary | ICD-10-CM | POA: Diagnosis not present

## 2020-12-17 DIAGNOSIS — Z803 Family history of malignant neoplasm of breast: Secondary | ICD-10-CM | POA: Diagnosis not present

## 2020-12-17 DIAGNOSIS — Z7901 Long term (current) use of anticoagulants: Secondary | ICD-10-CM | POA: Diagnosis not present

## 2020-12-17 DIAGNOSIS — I878 Other specified disorders of veins: Secondary | ICD-10-CM | POA: Diagnosis present

## 2020-12-17 DIAGNOSIS — Z8546 Personal history of malignant neoplasm of prostate: Secondary | ICD-10-CM | POA: Diagnosis not present

## 2020-12-17 DIAGNOSIS — R531 Weakness: Secondary | ICD-10-CM | POA: Diagnosis present

## 2020-12-17 DIAGNOSIS — I130 Hypertensive heart and chronic kidney disease with heart failure and stage 1 through stage 4 chronic kidney disease, or unspecified chronic kidney disease: Secondary | ICD-10-CM | POA: Diagnosis present

## 2020-12-17 DIAGNOSIS — I959 Hypotension, unspecified: Secondary | ICD-10-CM

## 2020-12-17 DIAGNOSIS — I5022 Chronic systolic (congestive) heart failure: Secondary | ICD-10-CM | POA: Diagnosis present

## 2020-12-17 DIAGNOSIS — N1831 Chronic kidney disease, stage 3a: Secondary | ICD-10-CM | POA: Diagnosis present

## 2020-12-17 DIAGNOSIS — N179 Acute kidney failure, unspecified: Secondary | ICD-10-CM | POA: Diagnosis not present

## 2020-12-17 DIAGNOSIS — I4891 Unspecified atrial fibrillation: Secondary | ICD-10-CM

## 2020-12-17 DIAGNOSIS — I872 Venous insufficiency (chronic) (peripheral): Secondary | ICD-10-CM | POA: Diagnosis present

## 2020-12-17 DIAGNOSIS — L03116 Cellulitis of left lower limb: Secondary | ICD-10-CM

## 2020-12-17 DIAGNOSIS — I9589 Other hypotension: Secondary | ICD-10-CM | POA: Diagnosis present

## 2020-12-17 DIAGNOSIS — E785 Hyperlipidemia, unspecified: Secondary | ICD-10-CM | POA: Diagnosis present

## 2020-12-17 DIAGNOSIS — N170 Acute kidney failure with tubular necrosis: Secondary | ICD-10-CM | POA: Diagnosis present

## 2020-12-17 DIAGNOSIS — E860 Dehydration: Secondary | ICD-10-CM | POA: Diagnosis present

## 2020-12-17 DIAGNOSIS — Z79899 Other long term (current) drug therapy: Secondary | ICD-10-CM | POA: Diagnosis not present

## 2020-12-17 DIAGNOSIS — Z20822 Contact with and (suspected) exposure to covid-19: Secondary | ICD-10-CM | POA: Diagnosis present

## 2020-12-17 DIAGNOSIS — R55 Syncope and collapse: Secondary | ICD-10-CM | POA: Diagnosis present

## 2020-12-17 DIAGNOSIS — L03115 Cellulitis of right lower limb: Secondary | ICD-10-CM | POA: Diagnosis present

## 2020-12-17 LAB — BASIC METABOLIC PANEL
Anion gap: 7 (ref 5–15)
BUN: 50 mg/dL — ABNORMAL HIGH (ref 8–23)
CO2: 19 mmol/L — ABNORMAL LOW (ref 22–32)
Calcium: 8.4 mg/dL — ABNORMAL LOW (ref 8.9–10.3)
Chloride: 110 mmol/L (ref 98–111)
Creatinine, Ser: 1.23 mg/dL (ref 0.61–1.24)
GFR, Estimated: 57 mL/min — ABNORMAL LOW (ref >60)
Glucose, Bld: 103 mg/dL — ABNORMAL HIGH (ref 70–99)
Potassium: 4.8 mmol/L (ref 3.5–5.1)
Sodium: 136 mmol/L (ref 135–145)

## 2020-12-17 LAB — GLUCOSE, CAPILLARY
Glucose-Capillary: 114 mg/dL — ABNORMAL HIGH (ref 70–99)
Glucose-Capillary: 86 mg/dL (ref 70–99)

## 2020-12-17 LAB — CBC
HCT: 37 % — ABNORMAL LOW (ref 39.0–52.0)
Hemoglobin: 12.8 g/dL — ABNORMAL LOW (ref 13.0–17.0)
MCH: 30.9 pg (ref 26.0–34.0)
MCHC: 34.6 g/dL (ref 30.0–36.0)
MCV: 89.4 fL (ref 80.0–100.0)
Platelets: 189 10*3/uL (ref 150–400)
RBC: 4.14 MIL/uL — ABNORMAL LOW (ref 4.22–5.81)
RDW: 15.3 % (ref 11.5–15.5)
WBC: 8.2 10*3/uL (ref 4.0–10.5)
nRBC: 0 % (ref 0.0–0.2)

## 2020-12-17 LAB — SARS CORONAVIRUS 2 (TAT 6-24 HRS): SARS Coronavirus 2: NEGATIVE

## 2020-12-17 LAB — SEDIMENTATION RATE: Sed Rate: 36 mm/hr — ABNORMAL HIGH (ref 0–16)

## 2020-12-17 LAB — HEMOGLOBIN A1C
Hgb A1c MFr Bld: 5.7 % — ABNORMAL HIGH (ref 4.8–5.6)
Mean Plasma Glucose: 116.89 mg/dL

## 2020-12-17 LAB — C-REACTIVE PROTEIN: CRP: 6.5 mg/dL — ABNORMAL HIGH (ref <1.0)

## 2020-12-17 MED ORDER — CARVEDILOL 6.25 MG PO TABS
3.1250 mg | ORAL_TABLET | Freq: Two times a day (BID) | ORAL | Status: DC
  Administered 2020-12-17 – 2020-12-18 (×2): 3.125 mg via ORAL
  Filled 2020-12-17 (×2): qty 1

## 2020-12-17 MED ORDER — INSULIN ASPART 100 UNIT/ML ~~LOC~~ SOLN: 0.0000 [IU] | Freq: Three times a day (TID) | SUBCUTANEOUS | Status: DC

## 2020-12-17 MED ORDER — LISINOPRIL 2.5 MG PO TABS
2.5000 mg | ORAL_TABLET | Freq: Every day | ORAL | Status: DC
  Administered 2020-12-17: 2.5 mg via ORAL
  Filled 2020-12-17 (×2): qty 1

## 2020-12-17 MED ORDER — SPIRONOLACTONE 25 MG PO TABS
25.0000 mg | ORAL_TABLET | Freq: Every day | ORAL | Status: DC
  Administered 2020-12-17 – 2020-12-18 (×2): 25 mg via ORAL
  Filled 2020-12-17 (×2): qty 1

## 2020-12-17 MED ORDER — INSULIN ASPART 100 UNIT/ML ~~LOC~~ SOLN: 0.0000 [IU] | Freq: Every day | SUBCUTANEOUS | Status: DC

## 2020-12-17 MED ORDER — MORPHINE SULFATE (PF) 2 MG/ML IV SOLN
1.0000 mg | Freq: Once | INTRAVENOUS | Status: AC
  Administered 2020-12-17: 1 mg via INTRAVENOUS
  Filled 2020-12-17: qty 1

## 2020-12-17 MED ORDER — MIDODRINE HCL 5 MG PO TABS
5.0000 mg | ORAL_TABLET | Freq: Three times a day (TID) | ORAL | Status: DC
Start: 2020-12-17 — End: 2020-12-18
  Administered 2020-12-17 – 2020-12-18 (×3): 5 mg via ORAL
  Filled 2020-12-17 (×3): qty 1

## 2020-12-17 MED ORDER — EMPAGLIFLOZIN 10 MG PO TABS
10.0000 mg | ORAL_TABLET | Freq: Every day | ORAL | Status: DC
  Administered 2020-12-18: 10 mg via ORAL
  Filled 2020-12-17: qty 1

## 2020-12-17 MED ORDER — SODIUM CHLORIDE 0.9 % IV SOLN
INTRAVENOUS | Status: DC | PRN
  Administered 2020-12-17: 1000 mL via INTRAVENOUS

## 2020-12-17 NOTE — TOC Progression Note (Signed)
Transition of Care [facility]) - Progression Note    Patient Details  Name: Chris Davis MRN: [id number] Date of Birth: 1932-08-28  [facility] CM/SW Contact  Eilleen Kempf, LCSW Phone Number: 12/17/2020, 12:32 PM  Clinical Narrative:    CSW acknowledged consult for HH/PT. Patient has not been ssen by PT yet, but Dr. Allena Katz wants him set up for HH/PT for PT and RN. CSW completed referral with [facility] and they will provide services after discharge. Patient may need some DME which has not been determined yet.        Expected Discharge Plan and Services                                                 Social Determinants of Health (SDOH) Interventions    Readmission Risk Interventions No flowsheet data found.

## 2020-12-17 NOTE — Progress Notes (Signed)
[facility]  - [place] [facility]   PATIENT NAME: Chris Davis    MR#:  [id number]  DATE OF BIRTH:  1932-12-03  SUBJECTIVE:  patient came in after he fell weak and slid down and stayed overnight sitting near his refrigerator day before. Felt very weak. Eating breakfast this morning. Feels a lot better blood pressure remains low normal. No fever REVIEW OF SYSTEMS:   Review of Systems  Constitutional: Negative for chills, fever and weight loss.  HENT: Negative for ear discharge, ear pain and nosebleeds.   Eyes: Negative for blurred vision, pain and discharge.  Respiratory: Negative for sputum production, shortness of breath, wheezing and stridor.   Cardiovascular: Positive for leg swelling. Negative for chest pain, palpitations, orthopnea and PND.  Gastrointestinal: Negative for abdominal pain, diarrhea, nausea and vomiting.  Genitourinary: Negative for frequency and urgency.  Musculoskeletal: Negative for back pain and joint pain.  Neurological: Positive for weakness. Negative for sensory change, speech change and focal weakness.  Psychiatric/Behavioral: Negative for depression and hallucinations. The patient is not nervous/anxious.    Tolerating Diet:yes Tolerating PT: pending  DRUG ALLERGIES:  No Known Allergies  VITALS:  Blood pressure 97/67, pulse 84, temperature 97.7 F (36.5 C), resp. rate 18, height 5\' 7"  (1.702 m), weight 86.7 kg, SpO2 99 %.  PHYSICAL EXAMINATION:   Physical Exam  GENERAL:  87 y.o.-year-old patient lying in the bed with no acute distress.  HEENT: Head atraumatic, normocephalic. Oropharynx and nasopharynx clear.  NECK:  Supple, no jugular venous distention. No thyroid enlargement, no tenderness.  LUNGS: Normal breath sounds bilaterally, no wheezing, rales, rhonchi. No use of accessory muscles of respiration.  CARDIOVASCULAR: S1, S2 normal. No murmurs, rubs, or gallops.  ABDOMEN: Soft, nontender, nondistended. Bowel sounds present. No  organomegaly or mass.  EXTREMITIES:       NEUROLOGIC: Cranial nerves II through XII are intact. No focal Motor or sensory deficits b/l.  weak PSYCHIATRIC:  patient is alert and oriented x 3.  SKIN: No obvious rash, lesion, or ulcer.   LABORATORY PANEL:  CBC Recent Labs  Lab 12/17/20 0538  WBC 8.2  HGB 12.8*  HCT 37.0*  PLT 189    Chemistries  Recent Labs  Lab 12/16/20 0841 12/17/20 0538  NA 130* 136  K 4.9 4.8  CL 99 110  CO2 21* 19*  GLUCOSE 91 103*  BUN 71* 50*  CREATININE 1.60* 1.23  CALCIUM 9.0 8.4*  AST 36  --   ALT 20  --   ALKPHOS 62  --   BILITOT 1.6*  --    Cardiac Enzymes No results for input(s): TROPONINI in the last 168 hours. RADIOLOGY:  No results found. ASSESSMENT AND PLAN:  Chris Davis is a 87 y.o. male with medical history significant of hypertension, hyperlipidemia, diabetes mellitus, prostate cancer (s/p of radiation therapy), atrial fibrillation on Eliquis, sCHF with EF of 40%, CKD stage IIIa, who presents with generalized weakness, lightheadedness, leg pain, leg swelling. Patient states that he has been feeling weak and lightheaded since last night. He states that he slide down the fridge and sat in the floor all night. He clarified that he did not fall and did not have any injury.  Cellulitis of lower extremity:  bilateral chronic venous stasis changes with bilateral edema Patient has hypotension, no fever or leukocytosis.  Does not meet criteria for sepsis currently, but patient is at high risk of developing sepsis.  Blood pressure 78/44, which improved to 91/55 after giving 2  L normal saline bolus. - Empiric antimicrobial treatment with vancomycin and cefepime--IV cepfepime and change to po abxs in am -cellulitis seems very superficial. Patient's main problem is chronic venous stasis changes due to chronic leg edema. - PRN Zofran for nausea - Blood cultures x negative -  Procalcitonin <0.10 and trended lactic acid levels to normal -- d/C IV  fluids -- wound consult appreciated. Ace wrap lower extremity. -- Patient recommended to continue wearing compression stockings at home -- per wife patient is recommended to follow vascular surgery as outpatient. She will work on getting an appointment.  HTN (hypertension) relative hypotension. Reviewed outpatient blood pressure recordings. Patient remains systolic 90- [id number] -discontinue metoprolol -- start Coreg 3.125 BID -- lisinopril 2.5 mg at bedtime (home medicine) -- continue torsemide and Spironolactone  Chronic systolic heart failure ([place]): 2D echo on 07/15/2019 showed EF of 40%.   --Patient has bilateral leg edema, no respiratory distress.  BNP 432, does not seem to have CHF exacerbation, but the patient is at high risk of developing CHF exacerbation -Watch volume status closely -resume torsemide 40 mg daily (patient recently took 60 mg daily for three days per PCP recommendation) -- continue spironolactone -continue Jardiance (started by Dr Nehemiah Massed) --echo --out pt EF 40%  Atrial fibrillation ([place]) -Continue Eliquis -now on coreg  HLD (hyperlipidemia) -Lipitor  Acute renal failure superimposed on stage 3a chronic kidney disease ([place]): Baseline creatinine 1.0-1.3.  His creatinine is 1.60, BUN 71.  Likely multifactorial etiology, including dehydration, ATN secondary to hypotension  -- Creatinine back to 1.2 -- resume home meds  generalized weakness and deconditioning. PT consulted. TOC for discharge planning.  DVT ppx: On Eliquis Code Status: Full code per her daughter Family Communication:  Yes, patient's wife on the phone jan 2nd Disposition Plan:  Anticipate discharge back to previous environment Consults called:  none Admission status: Med-surg bed   Dispo: The patient is from: Home  Anticipated d/c is to: Home  Anticipated d/c date is: 1 day  Patient currently is not medically stable to d/c. Status is:  Inpatient  Remains inpatient appropriate because:Inpatient level of care appropriate due to severity of illness patient came in with hypotension and lower extremity edema with chronic venous stasis changes. Blood pressure is a bit stable. Needs to work with physical therapy. Medication changes been done to ensure patient tolerated well.   Patient is improving clinically. If continues to show improvement will discharged to home tomorrow. This was discussed with patient and his wife.    TOTAL TIME TAKING CARE OF THIS PATIENT: *25* minutes.  >50% time spent on counselling and coordination of care  Note: This dictation was prepared with Dragon dictation along with smaller phrase technology. Any transcriptional errors that result from this process are unintentional.  Fritzi Mandes M.D    [facility]   CC: Primary care physician; Derinda Late, MDPatient ID: Rozelle Logan, male   DOB: Sep 25, 1932, 87 y.o.   MRN: [id number]

## 2020-12-17 NOTE — Consult Note (Signed)
WOC Nurse Consult Note: Reason for Consult: Bilateral LEs with erythema, edema. Partial thickness skin loss on anterior left LE noted. Photograph taken yesterday and uploaded to EMR is appreciated.Patient is being treated with systemic antibiotics for cellulitis. Wound type: Venous insufficiency plus infection. Pressure Injury POA: N/A Measurement: LLE partial thickness skin injury measuring 2cm x 1.5cm x 0.1cm Wound bed:red, dry Drainage (amount, consistency, odor) None Periwound: erythematous, intact Dressing procedure/placement/frequency: Guidance is provided for the topical care of this patient's LEs using an antimicrobial nonadherent dressing over the LLE area of skin loss and a dry boot (roll gauze topped with ACE bandage and changed twice daily) for gentle compression (~15-58mmHg).  The feet are to be placed into bilateral pressure redistribution heel boots to prevent pressure injury to the heels. A sacral prophylactic foam dressing is provided to prevent sacral pressure injury. A pressure redistribution chair cushion is provided for his used when OOB to chair as for when LEs are elevated there is increased pressure on the ischial tuberosities.  WOC nursing team will not follow, but will remain available to this patient, the nursing and medical teams.  Please re-consult if needed. Thanks, Ladona Mow, MSN, RN, GNP, [place]  Pager# [phone]

## 2020-12-17 NOTE — Evaluation (Signed)
Physical Therapy Evaluation Patient Details Name: Chris Davis MRN: [id number] DOB: Jun 22, 1932 Today's Date: 12/17/2020   History of Present Illness  Pt is an 87 y.o. male with medical history significant of hypertension, hyperlipidemia, diabetes mellitus, prostate cancer (s/p of radiation therapy), atrial fibrillation on Eliquis, sCHF with EF of 40%, CKD stage IIIa, who presented to ED with generalized weakness, lightheadedness, leg pain, leg swelling. Pt also with fall at home.    Clinical Impression  Pt alert, agreeable to PT, oriented x4, denied pain. Pt reported that he lives with his wife at [facility] (independent living), 3 falls in the last 6 months, uses RW or elevated rollator (outside).  The patient demonstrated bed mobility ultimately minA. Significant time given to maximize pt independence; heavy use of bed rails, needed minA for trunk elevation. Sit <> stand performed several times during session as well with RW, progressed from modA from EOB to CGA from recliner with adequate UE support. PT, pt, and wife reviewed sit <> stand hand placement to ensure safety, proper technique for stand to sit to safely navigate environment, and potentially needing to sleep in a recliner upon returning home to maximize safety/independence. Pt also ambulated ~29ft total with RW and CGA, no unsteadiness noted.  Overall the patient demonstrated deficits (see "PT Problem List") that impede the patient's functional abilities, safety, and mobility and would benefit from skilled PT intervention. Recommendation is HHPT with supervision for mobility/OOB.  Orthostatic vitals assessed, non significant. Pt with elevated HR 120s-low 130s with exertional activity.    Follow Up Recommendations Home health PT;Supervision for mobility/OOB    Equipment Recommendations  3in1 (PT)    Recommendations for Other Services       Precautions / Restrictions Precautions Precautions: Fall Precaution Comments: watch  HR Restrictions Weight Bearing Restrictions: No      Mobility  Bed Mobility Overal bed mobility: Needs Assistance Bed Mobility: Supine to Sit     Supine to sit: HOB elevated;Min assist     General bed mobility comments: extended time to allow pt to perform as independently, ultimately needed minA for trunk elevation    Transfers Overall transfer level: Needs assistance Equipment used: Rolling walker (2 wheeled) Transfers: Sit to/from Stand Sit to Stand: Mod assist;Min guard         General transfer comment: progressed from modA from EOB, to CGA from recliner with repetition  Ambulation/Gait   Gait Distance (Feet):  (67ft + 81ft in room) Assistive device: Rolling walker (2 wheeled)   Gait velocity: decreased   General Gait Details: significantly kyphotic, decreased step height/length bilaterally, very slow, cautious. no Unsteadiness noted  Stairs            Wheelchair Mobility    Modified Rankin (Stroke Patients Only)       Balance Overall balance assessment: Needs assistance Sitting-balance support: Feet supported Sitting balance-Leahy Scale: Good       Standing balance-Leahy Scale: Fair Standing balance comment: reliant on UE for dynamic standing/ambulation                             Pertinent Vitals/Pain Pain Assessment: No/denies pain    Home Living Family/patient expects to be discharged to:: Private residence ([facility]) Living Arrangements: Spouse/significant other Available Help at Discharge: Family;Available 24 hours/day Type of Home: Independent living facility Home Access: Level entry (threshold step)     Home Layout: One level Home Equipment: Walker - 2 wheels;Walker -  4 wheels;Shower seat;Grab bars - tub/shower (elevated rollator)      Prior Function Level of Independence: Independent with assistive device(s)         Comments: 3 falls including fall prior to admission; pt reported some  dizziness and fatigue caused him to fall all three times     Hand Dominance   Dominant Hand: Right    Extremity/Trunk Assessment   Upper Extremity Assessment Upper Extremity Assessment: Generalized weakness    Lower Extremity Assessment Lower Extremity Assessment: Generalized weakness    Cervical / Trunk Assessment Cervical / Trunk Assessment: Kyphotic  Communication   Communication: No difficulties  Cognition Arousal/Alertness: Awake/alert Behavior During Therapy: WFL for tasks assessed/performed Overall Cognitive Status: Within Functional Limits for tasks assessed                                        General Comments      Exercises     Assessment/Plan    PT Assessment Patient needs continued PT services  PT Problem List Decreased strength;Decreased mobility;Decreased activity tolerance;Decreased balance;Decreased knowledge of use of DME       PT Treatment Interventions DME instruction;Therapeutic exercise;Gait training;Balance training;Functional mobility training;Therapeutic activities;Patient/family education;Neuromuscular re-education    PT Goals (Current goals can be found in the Care Plan section)  Acute Rehab PT Goals Patient Stated Goal: to get his strength back PT Goal Formulation: With patient Time For Goal Achievement: 12/31/20 Potential to Achieve Goals: Good    Frequency Min 2X/week   Barriers to discharge        Co-evaluation               AM-PAC PT "6 Clicks" Mobility  Outcome Measure Help needed turning from your back to your side while in a flat bed without using bedrails?: A Lot Help needed moving from lying on your back to sitting on the side of a flat bed without using bedrails?: A Lot Help needed moving to and from a bed to a chair (including a wheelchair)?: A Little Help needed standing up from a chair using your arms (e.g., wheelchair or bedside chair)?: A Little Help needed to walk in hospital room?:  None Help needed climbing 3-5 steps with a railing? : A Lot 6 Click Score: 16    End of Session Equipment Utilized During Treatment: Gait belt Activity Tolerance: Patient tolerated treatment well Patient left: in chair;with chair alarm set;with family/visitor present;with call bell/phone within reach Nurse Communication: Mobility status PT Visit Diagnosis: Other abnormalities of gait and mobility (R26.89);Muscle weakness (generalized) (M62.81);Difficulty in walking, not elsewhere classified (R26.2);History of falling (Z91.81)    Time: 0768-0881 PT Time Calculation (min) (ACUTE ONLY): 43 min   Charges:   PT Evaluation $PT Eval Low Complexity: 1 Low PT Treatments $Therapeutic Exercise: 23-37 mins      Olga Coaster PT, DPT 3:01 PM,12/17/20

## 2020-12-18 LAB — GLUCOSE, CAPILLARY
Glucose-Capillary: 100 mg/dL — ABNORMAL HIGH (ref 70–99)
Glucose-Capillary: 117 mg/dL — ABNORMAL HIGH (ref 70–99)
Glucose-Capillary: 68 mg/dL — ABNORMAL LOW (ref 70–99)
Glucose-Capillary: 97 mg/dL (ref 70–99)

## 2020-12-18 MED ORDER — TORSEMIDE 20 MG PO TABS
40.0000 mg | ORAL_TABLET | Freq: Every day | ORAL | Status: DC
  Administered 2020-12-18: 40 mg via ORAL
  Filled 2020-12-18: qty 2

## 2020-12-18 MED ORDER — LISINOPRIL 2.5 MG PO TABS: 2.5000 mg | ORAL_TABLET | Freq: Every day | ORAL | 1 refills | Status: DC

## 2020-12-18 MED ORDER — CEFDINIR 300 MG PO CAPS
300.0000 mg | ORAL_CAPSULE | Freq: Two times a day (BID) | ORAL | Status: DC
  Filled 2020-12-18: qty 1

## 2020-12-18 MED ORDER — CARVEDILOL 3.125 MG PO TABS: 3.1250 mg | ORAL_TABLET | Freq: Two times a day (BID) | ORAL | 0 refills | Status: DC

## 2020-12-18 MED ORDER — TORSEMIDE 20 MG PO TABS: 40.0000 mg | ORAL_TABLET | Freq: Every day | ORAL | 0 refills | Status: DC

## 2020-12-18 MED ORDER — CEFDINIR 300 MG PO CAPS: 300.0000 mg | ORAL_CAPSULE | Freq: Two times a day (BID) | ORAL | 0 refills | Status: AC

## 2020-12-18 NOTE — Progress Notes (Signed)
Chris Davis  A and O x 4. VSS. Pt tolerating diet well. No complaints of pain or nausea. IV removed intact, prescriptions given. Pt voiced understanding of discharge instructions with no further questions. Pt discharged via wheelchair with RN.     Allergies as of 12/18/2020   No Known Allergies     Medication List    STOP taking these medications   metoprolol succinate 50 MG 24 hr tablet Commonly known as: TOPROL-XL     TAKE these medications   acetaminophen 500 MG tablet Commonly known as: TYLENOL Take 500 mg by mouth every 6 (six) hours as needed (for pain/headaches.).   apixaban 5 MG Tabs tablet Commonly known as: Eliquis Take 1 tablet (5 mg total) by mouth 2 (two) times daily.   atorvastatin 20 MG tablet Commonly known as: LIPITOR Take 20 mg by mouth daily.   carvedilol 3.125 MG tablet Commonly known as: COREG Take 1 tablet (3.125 mg total) by mouth 2 (two) times daily with a meal.   cefdinir 300 MG capsule Commonly known as: OMNICEF Take 1 capsule (300 mg total) by mouth every 12 (twelve) hours for 5 days.   Jardiance 10 MG Tabs tablet Generic drug: empagliflozin Take 10 mg by mouth daily with breakfast.   lisinopril 2.5 MG tablet Commonly known as: ZESTRIL Take 1 tablet (2.5 mg total) by mouth at bedtime. What changed: when to take this   spironolactone 25 MG tablet Commonly known as: ALDACTONE Take 25 mg by mouth daily.   torsemide 20 MG tablet Commonly known as: DEMADEX Take 2 tablets (40 mg total) by mouth daily.       Vitals:   12/18/20 1132 12/18/20 1238  BP: (!) 87/57 95/61  Pulse: 86 78  Resp: 18   Temp: 97.8 F (36.6 C)   SpO2: 99%     Chris Davis

## 2020-12-18 NOTE — Progress Notes (Signed)
Wound care has been done by this RN to the pt this morning.

## 2020-12-18 NOTE — TOC Transition Note (Signed)
Transition of Care [facility]) - CM/SW Discharge Note   Patient Details  Name: Chris Davis MRN: [id number] Date of Birth: 07-Oct-1932  [facility] CM/SW Contact:  Candie Chroman, LCSW Phone Number: 12/18/2020, 10:47 AM   Clinical Narrative: CSW met with patient. No supports at bedside. CSW introduced role and explained that PT recommendations would be discussed. Patient agreeable to home health PT and RN. Notified him that [facility] coworker made referral to [facility] yesterday. Reviewed all agencies that take [facility]. He is agreeable to [facility]. [facility] representative is aware of discharge plan for today. CSW notified patient of DME recommendation for a 3-in-1 and he is agreeable. CSW called [place] representative to order. His wife later came to bedside so CSW updated her as well. No further concerns. Patient has orders to discharge home today. CSW signing off.    Final next level of care: [place] Barriers to Discharge: No Barriers Identified   Patient Goals and CMS Choice     Choice offered to / list presented to : Patient  Discharge Placement                       Discharge Plan and Services     Post Acute Care Choice: [place]          DME Arranged: 3-N-1 DME Agency: [employer] Date DME Agency Contacted: 12/18/20   Representative spoke with at DME Agency: Nash Shearer [place]: PT,RN [place]: [facility] Date [place]: 12/18/20   Representative spoke with at [place]: [place] Interventions     Readmission Risk Interventions No flowsheet data found.

## 2020-12-18 NOTE — Discharge Summary (Signed)
[facility] - [place] [facility]   PATIENT NAME: Chris Davis    MR#:  [id number]  DATE OF BIRTH:  January 14, 1932  DATE OF ADMISSION:  12/16/2020 ADMITTING PHYSICIAN: Lorretta Harp, MD  DATE OF DISCHARGE: 12/18/2020  PRIMARY CARE PHYSICIAN: Kandyce Rud, MD    ADMISSION DIAGNOSIS:  Weakness [R53.1] Near syncope [R55] Hypotension [I95.9] Hypotension, unspecified hypotension type [I95.9]  DISCHARGE DIAGNOSIS:  Near syncope with hypotension--improved Relative Low BP--chronic Bilateral leg edema with chronic venous stasis changes and cellulitis chronic congestive heart failure systolic SECONDARY DIAGNOSIS:   Past Medical History:  Diagnosis Date  . Arrhythmia    atrial fibrillation  . Hx of radiation therapy   . Hyperlipidemia   . Hypertension   . Prostate cancer (HCC)   . Prostate cancer [facility])     HOSPITAL COURSE:   [name] a 87 y.o.malewith medical history significant ofhypertension, hyperlipidemia, diabetes mellitus, prostate cancer (s/p of radiation therapy), atrial fibrillation on Eliquis,sCHF with EF of 40%, CKD stage IIIa, who presents with generalized weakness, lightheadedness, leg pain, leg swelling. Patient states that he has been feeling weak and lightheaded since last night. He states that he slide down the fridge andsat in the floor all night. Heclarified that he did not fallanddid not have any injury.  Cellulitis of lower extremity: bilateral chronic venous stasis changes with bilateral edema Patient has hypotension, no fever or leukocytosis. no fever or leukocytosis. Blood pressure in ER  78/44, which improved to 91/55 after giving 2 L normal saline bolus. - --IV cepfepime and change to po abxs today -cellulitis seems very superficial. Patient's main problem is chronic venous stasis changes due to chronic leg edema. Compression stockings and ace wrap down. - PRN Zofran for nausea - Blood cultures x negative -  Procalcitonin <0.10 and trended lactic acid  levels to normal -- d/C IV fluids -- wound consult appreciated. Ace wrap lower extremity. -- Patient recommended to continue wearing compression stockings at home -- per wife patient is recommended to follow vascular surgery as outpatient. She will work on getting an appointment.  HTN (hypertension) relative chronic low blood pressure. Reviewed outpatient blood pressure recordings. Patient remains systolic 90- 114 -discontinue metoprolol -- start Coreg 3.125 BID -- lisinopril 2.5 mg at bedtime (home medicine) -- continue torsemide and Spironolactone  Chronic systolic heart failure (HCC): 2D echo on 07/15/2019 showed EF of 40%.  --Patient has bilateral leg edema, no respiratory distress. BNP 432, does not seem to have CHF exacerbation, but the patient is at high risk of developing CHF exacerbation -resume torsemide 40 mg daily (patient recently took 60 mg daily for three days per PCP recommendation) -- continue spironolactone -continue Jardiance (started by Dr Gwen Pounds) --echo --out pt EF 40%  Atrial fibrillation (HCC) -Continue Eliquis -now on coreg  HLD (hyperlipidemia) -Lipitor  Acute renal failure superimposed on stage 3a chronic kidney disease (HCC): Baseline creatinine 1.0-1.3. His creatinine is 1.60, BUN 71. Likely multifactorial etiology, including dehydration, ATN secondary to hypotension  -- Creatinine back to 1.2 -- resume home meds  generalized weakness and deconditioning. PT consulted-- recommends home health PT. Mobility with supervision. Patient has a rolling walker. Will give three and one commode  TOC for discharge planning.  DVT ppx:On Eliquis Code Status:Full code Family Communication: Yes, patient'swife on the phone jan 3rd Disposition Plan: Anticipate discharge back to previous environment Consults called:none Admission status: Med-surg bed   Dispo: The patient is from:Home Anticipated d/c is  XY:VOPF Anticipated d/c date is: today Patient currently is medically best  at baseline for discharge Status is: Inpatient  Patient is improving clinically. Will discharge patient to home with home health PT and RN. This was discussed with patient and his wife and agreeable CONSULTS OBTAINED:    DRUG ALLERGIES:  No Known Allergies  DISCHARGE MEDICATIONS:   Allergies as of 12/18/2020   No Known Allergies     Medication List    STOP taking these medications   metoprolol succinate 50 MG 24 hr tablet Commonly known as: TOPROL-XL     TAKE these medications   acetaminophen 500 MG tablet Commonly known as: TYLENOL Take 500 mg by mouth every 6 (six) hours as needed (for pain/headaches.).   apixaban 5 MG Tabs tablet Commonly known as: Eliquis Take 1 tablet (5 mg total) by mouth 2 (two) times daily.   atorvastatin 20 MG tablet Commonly known as: LIPITOR Take 20 mg by mouth daily.   carvedilol 3.125 MG tablet Commonly known as: COREG Take 1 tablet (3.125 mg total) by mouth 2 (two) times daily with a meal.   cefdinir 300 MG capsule Commonly known as: OMNICEF Take 1 capsule (300 mg total) by mouth every 12 (twelve) hours for 5 days.   Jardiance 10 MG Tabs tablet Generic drug: empagliflozin Take 10 mg by mouth daily with breakfast.   lisinopril 2.5 MG tablet Commonly known as: ZESTRIL Take 1 tablet (2.5 mg total) by mouth at bedtime. What changed: when to take this   spironolactone 25 MG tablet Commonly known as: ALDACTONE Take 25 mg by mouth daily.       If you experience worsening of your admission symptoms, develop shortness of breath, life threatening emergency, suicidal or homicidal thoughts you must seek medical attention immediately by calling 911 or calling your MD immediately  if symptoms less severe.  You Must read complete instructions/literature along with all the possible adverse reactions/side effects for all the Medicines you  take and that have been prescribed to you. Take any new Medicines after you have completely understood and accept all the possible adverse reactions/side effects.   Please note  You were cared for by a hospitalist during your hospital stay. If you have any questions about your discharge medications or the care you received while you were in the hospital after you are discharged, you can call the unit and asked to speak with the hospitalist on call if the hospitalist that took care of you is not available. Once you are discharged, your primary care physician will handle any further medical issues. Please note that NO REFILLS for any discharge medications will be authorized once you are discharged, as it is imperative that you return to your primary care physician (or establish a relationship with a primary care physician if you do not have one) for your aftercare needs so that they can reassess your need for medications and monitor your lab values. Today   SUBJECTIVE  Doing well. No  Complaints. Ate good BF   VITAL SIGNS:  Blood pressure 98/74, pulse 81, temperature 98.4 F (36.9 C), resp. rate 16, height 5\' 7"  (1.702 m), weight 85.3 kg, SpO2 100 %.  I/O:    Intake/Output Summary (Last 24 hours) at 12/18/2020 0921 Last data filed at 12/18/2020 0549 Gross per 24 hour  Intake 711.55 ml  Output 1000 ml  Net -288.45 ml    PHYSICAL EXAMINATION:  GENERAL:  87 y.o.-year-old patient lying in the bed with no acute distress.  EYES: Pupils equal, round, reactive to light and accommodation. No scleral  icterus.  HEENT: Head atraumatic, normocephalic. Oropharynx and nasopharynx clear.  LUNGS: Normal breath sounds bilaterally, no wheezing, rales,rhonchi or crepitation. No use of accessory muscles of respiration.  CARDIOVASCULAR: S1, S2 normal. No murmurs, rubs, or gallops.  ABDOMEN: Soft, non-tender, non-distended. Bowel sounds present. No organomegaly or mass.  EXTREMITIES: bilateral chronic lower  extremity edema with chronic venous stasis changes. Left lower extremity ace wrap done. Right lower extremity compression stockings present. NEUROLOGIC: Cranial nerves II through XII are intact. Muscle strength 5/5 in all extremities. Sensation intact. Gait not checked.  PSYCHIATRIC: The patient is alert and oriented x 3.  SKIN: No obvious rash, lesion, or ulcer.   DATA REVIEW:   CBC  Recent Labs  Lab 12/17/20 0538  WBC 8.2  HGB 12.8*  HCT 37.0*  PLT 189    Chemistries  Recent Labs  Lab 12/16/20 0841 12/17/20 0538  NA 130* 136  K 4.9 4.8  CL 99 110  CO2 21* 19*  GLUCOSE 91 103*  BUN 71* 50*  CREATININE 1.60* 1.23  CALCIUM 9.0 8.4*  AST 36  --   ALT 20  --   ALKPHOS 62  --   BILITOT 1.6*  --     Microbiology Results   Recent Results (from the past 240 hour(s))  SARS CORONAVIRUS 2 (TAT 6-24 HRS) Nasopharyngeal Urine, Clean Catch     Status: None   Collection Time: 12/16/20  2:22 PM   Specimen: Urine, Clean Catch; Nasopharyngeal  Result Value Ref Range Status   SARS Coronavirus 2 NEGATIVE NEGATIVE Final    Comment: (NOTE) SARS-CoV-2 target nucleic acids are NOT DETECTED.  The SARS-CoV-2 RNA is generally detectable in upper and lower respiratory specimens during the acute phase of infection. Negative results do not preclude SARS-CoV-2 infection, do not rule out co-infections with other pathogens, and should not be used as the sole basis for treatment or other patient management decisions. Negative results must be combined with clinical observations, patient history, and epidemiological information. The expected result is Negative.  Fact Sheet for Patients: SugarRoll.be  Fact Sheet for Healthcare Providers: https://www.woods-mathews.com/  This test is not yet approved or cleared by the [place] FDA and  has been authorized for detection and/or diagnosis of SARS-CoV-2 by FDA under an Emergency Use Authorization  (EUA). This EUA will remain  in effect (meaning this test can be used) for the duration of the COVID-19 declaration under Se ction 564(b)(1) of the Act, 21 U.S.C. section 360bbb-3(b)(1), unless the authorization is terminated or revoked sooner.  Performed at [facility], [place] [address], [place], [place] [address]   Culture, blood (routine x 2)     Status: None (Preliminary result)   Collection Time: 12/16/20  6:59 PM   Specimen: BLOOD  Result Value Ref Range Status   Specimen Description BLOOD BLOOD LEFT FOREARM  Final   Special Requests   Final    BOTTLES DRAWN AEROBIC AND ANAEROBIC Blood Culture results may not be optimal due to an inadequate volume of blood received in culture bottles   Culture   Final    NO GROWTH 2 DAYS Performed at [facility], [address] [place], [place] [address]    Report Status PENDING  Incomplete  Culture, blood (routine x 2)     Status: None (Preliminary result)   Collection Time: 12/16/20  6:59 PM   Specimen: BLOOD  Result Value Ref Range Status   Specimen Description BLOOD LEFT ANTECUBITAL  Final   Special  Requests   Final    BOTTLES DRAWN AEROBIC AND ANAEROBIC Blood Culture results may not be optimal due to an inadequate volume of blood received in culture bottles   Culture   Final    NO GROWTH 2 DAYS Performed at [facility], [address], [place], [place] [address]    Report Status PENDING  Incomplete    RADIOLOGY:  No results found.   CODE STATUS:     Code Status Orders  (From admission, onward)         Start     Ordered   12/16/20 1647  Full code  Continuous        12/16/20 1646        Code Status History    Date Active Date Inactive Code Status Order ID Comments User Context   07/22/2017 0222 07/22/2017 1450 Full Code [id number]  Oralia Manis, MD Inpatient   Advance Care Planning Activity       TOTAL TIME TAKING CARE OF THIS PATIENT: *35* minutes.    Enedina Finner M.D  [facility]    CC: Primary care physician; Kandyce Rud, MD

## 2020-12-18 NOTE — Progress Notes (Signed)
Hypoglycemic Event  CBG: 68  Treatment: 8 oz juice/soda  Symptoms: None  Follow-up CBG: Time:0830 CBG Result:100  Possible Reasons for Event: Unknown  Comments/MD notified:yes     [name]

## 2020-12-21 LAB — CULTURE, BLOOD (ROUTINE X 2)
Culture: NO GROWTH
Culture: NO GROWTH

## 2021-01-16 ENCOUNTER — Encounter (INDEPENDENT_AMBULATORY_CARE_PROVIDER_SITE_OTHER): Payer: Self-pay | Admitting: Vascular Surgery

## 2021-01-16 ENCOUNTER — Ambulatory Visit (INDEPENDENT_AMBULATORY_CARE_PROVIDER_SITE_OTHER): Payer: Medicare PPO | Admitting: Vascular Surgery

## 2021-01-16 ENCOUNTER — Other Ambulatory Visit: Payer: Self-pay

## 2021-01-16 VITALS — BP 89/54 | HR 72 | Ht 66.0 in | Wt 179.0 lb

## 2021-01-16 DIAGNOSIS — I5022 Chronic systolic (congestive) heart failure: Secondary | ICD-10-CM

## 2021-01-16 DIAGNOSIS — L97201 Non-pressure chronic ulcer of unspecified calf limited to breakdown of skin: Secondary | ICD-10-CM

## 2021-01-16 DIAGNOSIS — R600 Localized edema: Secondary | ICD-10-CM

## 2021-01-16 DIAGNOSIS — N1832 Chronic kidney disease, stage 3b: Secondary | ICD-10-CM

## 2021-01-16 DIAGNOSIS — L97209 Non-pressure chronic ulcer of unspecified calf with unspecified severity: Secondary | ICD-10-CM | POA: Insufficient documentation

## 2021-01-16 DIAGNOSIS — I10 Essential (primary) hypertension: Secondary | ICD-10-CM

## 2021-01-16 DIAGNOSIS — E785 Hyperlipidemia, unspecified: Secondary | ICD-10-CM | POA: Diagnosis not present

## 2021-01-16 DIAGNOSIS — L97221 Non-pressure chronic ulcer of left calf limited to breakdown of skin: Secondary | ICD-10-CM

## 2021-01-16 NOTE — Assessment & Plan Note (Signed)
Has been present for many years and is a major contributing factor to his lower extremity swelling.

## 2021-01-16 NOTE — Assessment & Plan Note (Signed)
A contributing factor to his LE edema.

## 2021-01-16 NOTE — Progress Notes (Signed)
Patient ID: Chris Davis, male   DOB: 08/22/1932, 87 y.o.   MRN: [id number]  Chief Complaint  Patient presents with  . New Patient (Initial Visit)    Lymphedema    HPI Chris Davis is a 87 y.o. male.  I am asked to see the patient by Dr. Nehemiah Massed for evaluation of pronounced leg swelling. He has had swelling for many years. He has had issues with heart failure for over 40 years.  He has had the swelling intermittently worsened over time and recently it became very severe and he developed ulcerations with weeping.  He has been getting home health wraps with what sounds like calamine Unna boots for several weeks now.  The skin was almost healed but he had skin tears with an Unna boot change recently and now has multiple ulcerations on the left calf.  The swelling is still quite pronounced.  No previous history of DVT or superficial thrombophlebitis to his knowledge.  Has had radiation to the pelvis for prostate cancer and has atrial fibrillation with his chronic heart issues.  No fevers or chills or signs of systemic infection.   Past Medical History:  Diagnosis Date  . Arrhythmia    atrial fibrillation  . Hx of radiation therapy   . Hyperlipidemia   . Hypertension   . Prostate cancer ([place])   . Prostate cancer [facility])     Past Surgical History:  Procedure Laterality Date  . CARDIOVERSION N/A 09/10/2017   Procedure: Cardioversion;  Surgeon: Corey Skains, MD;  Location: [facility] ORS;  Service: Cardiovascular;  Laterality: N/A;  . CAROTID ARTERY ANGIOPLASTY    . CATARACT EXTRACTION Bilateral   . INSERTION PROSTATE RADIATION SEED    . TONSILLECTOMY       Family History  Problem Relation Age of Onset  . Breast cancer Sister   . Throat cancer Paternal Grandmother   no bleeding and clotting disorders   Social History   Tobacco Use  . Smoking status: Never Smoker  . Smokeless tobacco: Never Used  Substance Use Topics  . Alcohol use: Yes    Comment: occ  . Drug use: No      No Known Allergies  Current Outpatient Medications  Medication Sig Dispense Refill  . acetaminophen (TYLENOL) 500 MG tablet Take 500 mg by mouth every 6 (six) hours as needed (for Davis/headaches.).    Marland Kitchen apixaban (ELIQUIS) 5 MG TABS tablet Take 1 tablet (5 mg total) by mouth 2 (two) times daily. 60 tablet 0  . atorvastatin (LIPITOR) 20 MG tablet Take 20 mg by mouth daily.    . carvedilol (COREG) 3.125 MG tablet Take 1 tablet (3.125 mg total) by mouth 2 (two) times daily with a meal. 60 tablet 0  . JARDIANCE 10 MG TABS tablet Take 10 mg by mouth daily with breakfast.    . lisinopril (ZESTRIL) 2.5 MG tablet Take 1 tablet (2.5 mg total) by mouth at bedtime. 30 tablet 1  . spironolactone (ALDACTONE) 25 MG tablet Take 25 mg by mouth daily.    Marland Kitchen torsemide (DEMADEX) 20 MG tablet Take 2 tablets (40 mg total) by mouth daily. 60 tablet 0   No current facility-administered medications for this visit.      REVIEW OF SYSTEMS (Negative unless checked)  Constitutional: '[]' Weight loss  '[]' Fever  '[]' Chills Cardiac: '[]' Chest Davis   '[]' Chest pressure   '[x]' Palpitations   '[]' Shortness of breath when laying flat   '[]' Shortness of breath at rest   '[x]' Shortness of breath  with exertion. Vascular:  '[]' Davis in legs with walking   '[]' Davis in legs at rest   '[]' Davis in legs when laying flat   '[]' Claudication   '[]' Davis in feet when walking  '[]' Davis in feet at rest  '[]' Davis in feet when laying flat   '[]' History of DVT   '[]' Phlebitis   '[x]' Swelling in legs   '[]' Varicose veins   '[x]' Non-healing ulcers Pulmonary:   '[]' Uses home oxygen   '[]' Productive cough   '[]' Hemoptysis   '[]' Wheeze  '[]' COPD   '[]' Asthma Neurologic:  '[]' Dizziness  '[]' Blackouts   '[]' Seizures   '[]' History of stroke   '[]' History of TIA  '[]' Aphasia   '[]' Temporary blindness   '[]' Dysphagia   '[]' Weakness or numbness in arms   '[]' Weakness or numbness in legs Musculoskeletal:  '[x]' Arthritis   '[]' Joint swelling   '[]' Joint Davis   '[]' Low back Davis Hematologic:  '[]' Easy bruising  '[]' Easy bleeding    '[]' Hypercoagulable state   '[]' Anemic  '[]' Hepatitis Gastrointestinal:  '[]' Blood in stool   '[]' Vomiting blood  '[]' Gastroesophageal reflux/heartburn   '[]' Abdominal Davis Genitourinary:  '[]' Chronic kidney disease   '[]' Difficult urination  '[]' Frequent urination  '[]' Burning with urination   '[]' Hematuria Skin:  '[]' Rashes   '[x]' Ulcers   '[x]' Wounds Psychological:  '[]' History of anxiety   '[]'  History of major depression.    Physical Exam BP (!) 89/54   Pulse 72   Ht '5\' 6"'  (1.676 m)   Wt 179 lb (81.2 kg)   BMI 28.89 kg/m  Gen:  WD/WN, NAD. Appears younger than stated age. Head: [place]/AT, No temporalis wasting.  Ear/Nose/Throat: Hearing grossly intact, nares w/o erythema or drainage, oropharynx w/o Erythema/Exudate Eyes: Conjunctiva clear, sclera non-icteric  Neck: trachea midline.  No JVD.  Pulmonary:  Good air movement, respirations not labored, no use of accessory muscles  Cardiac: irregular Vascular:  Vessel Right Left  Radial Palpable Palpable                          DP 1+ 1+  PT Chris Chris   Gastrointestinal:. No masses, surgical incisions, or scars. Musculoskeletal: M/S 5/5 throughout.  Extremities without ischemic changes.  No deformity or atrophy. 2-3+ BLE edema.  Several superficial skin breakdowns particular in the left anterior and lateral calf. Neurologic: Sensation grossly intact in extremities.  Symmetrical.  Speech is fluent. Motor exam as listed above. Psychiatric: Judgment intact, Mood & affect appropriate for pt's clinical situation. Dermatologic: Several superficial skin breakdowns particular in the left anterior and lateral calf    Radiology No results found.  Labs Recent Results (from the past 2160 hour(s))  Basic metabolic panel     Status: Abnormal   Collection Time: 11/02/20  4:51 AM  Result Value Ref Range   Sodium 131 (L) 135 - 145 mmol/L   Potassium 4.4 3.5 - 5.1 mmol/L   Chloride 97 (L) 98 - 111 mmol/L   CO2 23 22 - 32 mmol/L   Glucose, Bld 78 70 - 99 mg/dL     Comment: Glucose reference range applies only to samples taken after fasting for at least 8 hours.   BUN 45 (H) 8 - 23 mg/dL   Creatinine, Ser 1.34 (H) 0.61 - 1.24 mg/dL   Calcium 8.8 (L) 8.9 - 10.3 mg/dL   GFR, Estimated 52 (L) >60 mL/min    Comment: (NOTE) Calculated using the CKD-EPI Creatinine Equation (2021)    Anion gap 11 5 - 15    Comment: Performed at [facility], [address]  [place], [place] [address]  CBC     Status: Abnormal   Collection Time: 11/02/20  4:51 AM  Result Value Ref Range   WBC 7.2 4.0 - 10.5 K/uL   RBC 4.63 4.22 - 5.81 MIL/uL   Hemoglobin 14.2 13.0 - 17.0 g/dL   HCT 42.6 39.0 - 52.0 %   MCV 92.0 80.0 - 100.0 fL   MCH 30.7 26.0 - 34.0 pg   MCHC 33.3 30.0 - 36.0 g/dL   RDW 14.6 11.5 - 15.5 %   Platelets 166 150 - 400 K/uL   nRBC 0.3 (H) 0.0 - 0.2 %    Comment: Performed at [facility], [address] [place], [place] [address]  Urinalysis, Complete w Microscopic     Status: Abnormal   Collection Time: 11/02/20  4:51 AM  Result Value Ref Range   Color, Urine YELLOW (A) YELLOW   APPearance CLEAR (A) CLEAR   Specific Gravity, Urine 1.013 1.005 - 1.030   pH 6.0 5.0 - 8.0   Glucose, UA >=500 (A) NEGATIVE mg/dL   Hgb urine dipstick NEGATIVE NEGATIVE   Bilirubin Urine NEGATIVE NEGATIVE   Ketones, ur NEGATIVE NEGATIVE mg/dL   Protein, ur NEGATIVE NEGATIVE mg/dL   Nitrite NEGATIVE NEGATIVE   Leukocytes,Ua NEGATIVE NEGATIVE   RBC / HPF 0-5 0 - 5 RBC/hpf   WBC, UA 0-5 0 - 5 WBC/hpf   Bacteria, UA NONE SEEN NONE SEEN   Squamous Epithelial / LPF 0-5 0 - 5   Hyaline Casts, UA PRESENT     Comment: Performed at [facility], [address] [place], [place] [address]  Troponin I (High Sensitivity)     Status: None   Collection Time: 11/02/20  4:51 AM  Result Value Ref Range   Troponin I (High Sensitivity) 9 <18 ng/L    Comment: (NOTE) Elevated high sensitivity troponin I (hsTnI) values and significant  changes across  serial measurements may suggest ACS but many other  chronic and acute conditions are known to elevate hsTnI results.  Refer to the "Links" section for chest Davis algorithms and additional  guidance. Performed at [facility], [address] [place], [place] [address]   Basic metabolic panel     Status: Abnormal   Collection Time: 12/16/20  8:41 AM  Result Value Ref Range   Sodium 130 (L) 135 - 145 mmol/L   Potassium 4.9 3.5 - 5.1 mmol/L   Chloride 99 98 - 111 mmol/L   CO2 21 (L) 22 - 32 mmol/L   Glucose, Bld 91 70 - 99 mg/dL    Comment: Glucose reference range applies only to samples taken after fasting for at least 8 hours.   BUN 71 (H) 8 - 23 mg/dL   Creatinine, Ser 1.60 (H) 0.61 - 1.24 mg/dL   Calcium 9.0 8.9 - 10.3 mg/dL   GFR, Estimated 42 (L) >60 mL/min    Comment: (NOTE) Calculated using the CKD-EPI Creatinine Equation (2021)    Anion gap 10 5 - 15    Comment: Performed at [facility], [address] [place], [place] [address]  CBC     Status: None   Collection Time: 12/16/20  8:41 AM  Result Value Ref Range   WBC 7.6 4.0 - 10.5 K/uL   RBC 4.55 4.22 - 5.81 MIL/uL   Hemoglobin 13.7 13.0 - 17.0 g/dL   HCT 40.4 39.0 - 52.0 %   MCV 88.8 80.0 - 100.0 fL   MCH 30.1 26.0 -  34.0 pg   MCHC 33.9 30.0 - 36.0 g/dL   RDW 15.1 11.5 - 15.5 %   Platelets 209 150 - 400 K/uL   nRBC 0.0 0.0 - 0.2 %    Comment: Performed at [facility], [place], [place] [address]  Troponin I (High Sensitivity)     Status: None   Collection Time: 12/16/20  8:41 AM  Result Value Ref Range   Troponin I (High Sensitivity) 15 <18 ng/L    Comment: (NOTE) Elevated high sensitivity troponin I (hsTnI) values and significant  changes across serial measurements may suggest ACS but many other  chronic and acute conditions are known to elevate hsTnI results.  Refer to the "Links" section for chest Davis algorithms and additional  guidance. Performed at [facility], [address] [place], [place] [address]   Hepatic function panel     Status: Abnormal   Collection Time: 12/16/20  8:41 AM  Result Value Ref Range   Total Protein 6.2 (L) 6.5 - 8.1 g/dL   Albumin 3.3 (L) 3.5 - 5.0 g/dL   AST 36 15 - 41 U/L   ALT 20 0 - 44 U/L   Alkaline Phosphatase 62 38 - 126 U/L   Total Bilirubin 1.6 (H) 0.3 - 1.2 mg/dL   Bilirubin, Direct 0.3 (H) 0.0 - 0.2 mg/dL   Indirect Bilirubin 1.3 (H) 0.3 - 0.9 mg/dL    Comment: Performed at [facility], [address] [place], [place] [address]  Lipase, blood     Status: Abnormal   Collection Time: 12/16/20  8:41 AM  Result Value Ref Range   Lipase 53 (H) 11 - 51 U/L    Comment: Performed at [facility] [address] [place], [place] [address]  CK     Status: None   Collection Time: 12/16/20  8:41 AM  Result Value Ref Range   Total CK 377 49 - 397 U/L    Comment: Performed at [facility], [address] [place], [place] [address]  Brain natriuretic peptide     Status: Abnormal   Collection Time: 12/16/20  8:41 AM  Result Value Ref Range   B Natriuretic Peptide 432.3 (H) 0.0 - 100.0 pg/mL    Comment: Performed at [facility], [place], [place] [address]  POC SARS Coronavirus 2 Ag-ED - Nasal Swab (BD Veritor Kit)     Status: None   Collection Time: 12/16/20 11:20 AM  Result Value Ref Range   SARS Coronavirus 2 Ag NEGATIVE NEGATIVE    Comment: (NOTE) SARS-CoV-2 antigen NOT DETECTED.   Negative results are presumptive.  Negative results do not preclude SARS-CoV-2 infection and should not be used as the sole basis for treatment or other patient management decisions, including infection  control decisions, particularly in the presence of clinical signs and  symptoms consistent with COVID-19, or in those who have been in contact with the virus.  Negative results must be combined with clinical observations, patient history, and  epidemiological information. The expected result is Negative.  Fact Sheet for Patients: PodPark.tn  Fact Sheet for Healthcare Providers: GiftContent.is   This test is not yet approved or cleared by the [place] FDA and  has been authorized for detection and/or diagnosis of SARS-CoV-2 by FDA under an Emergency Use Authorization (EUA).  This EUA will remain in effect (meaning this test can be used) for the duration of  the C OVID-19 declaration under Section  564(b)(1) of the Act, 21 U.S.C. section 360bbb-3(b)(1), unless the authorization is terminated or revoked sooner.    Urinalysis, Complete w Microscopic Urine, Clean Catch     Status: Abnormal   Collection Time: 12/16/20  2:22 PM  Result Value Ref Range   Color, Urine YELLOW (A) YELLOW   APPearance CLEAR (A) CLEAR   Specific Gravity, Urine 1.013 1.005 - 1.030   pH 6.0 5.0 - 8.0   Glucose, UA >=500 (A) NEGATIVE mg/dL   Hgb urine dipstick NEGATIVE NEGATIVE   Bilirubin Urine NEGATIVE NEGATIVE   Ketones, ur NEGATIVE NEGATIVE mg/dL   Protein, ur NEGATIVE NEGATIVE mg/dL   Nitrite NEGATIVE NEGATIVE   Leukocytes,Ua NEGATIVE NEGATIVE   RBC / HPF 0-5 0 - 5 RBC/hpf   WBC, UA 0-5 0 - 5 WBC/hpf   Bacteria, UA NONE SEEN NONE SEEN   Squamous Epithelial / LPF NONE SEEN 0 - 5    Comment: Performed at [facility], [address] [place], [place] [address]  SARS CORONAVIRUS 2 (TAT 6-24 HRS) Nasopharyngeal Urine, Clean Catch     Status: None   Collection Time: 12/16/20  2:22 PM   Specimen: Urine, Clean Catch; Nasopharyngeal  Result Value Ref Range   SARS Coronavirus 2 NEGATIVE NEGATIVE    Comment: (NOTE) SARS-CoV-2 target nucleic acids are NOT DETECTED.  The SARS-CoV-2 RNA is generally detectable in upper and lower respiratory specimens during the acute phase of infection. Negative results do not preclude SARS-CoV-2 infection, do not rule out co-infections with  other pathogens, and should not be used as the sole basis for treatment or other patient management decisions. Negative results must be combined with clinical observations, patient history, and epidemiological information. The expected result is Negative.  Fact Sheet for Patients: SugarRoll.be  Fact Sheet for Healthcare Providers: https://www.woods-mathews.com/  This test is not yet approved or cleared by the [place] FDA and  has been authorized for detection and/or diagnosis of SARS-CoV-2 by FDA under an Emergency Use Authorization (EUA). This EUA will remain  in effect (meaning this test can be used) for the duration of the COVID-19 declaration under Se ction 564(b)(1) of the Act, 21 U.S.C. section 360bbb-3(b)(1), unless the authorization is terminated or revoked sooner.  Performed at [facility], [place] [address], [place], [place] [address]   Cortisol     Status: None   Collection Time: 12/16/20  2:22 PM  Result Value Ref Range   Cortisol, Plasma 18.4 ug/dL    Comment: (NOTE) AM    6.7 - 22.6 ug/dL PM   <10.0       ug/dL Performed at [facility] [address], [place], [place] [address]   Culture, blood (routine x 2)     Status: None   Collection Time: 12/16/20  6:59 PM   Specimen: BLOOD  Result Value Ref Range   Specimen Description BLOOD BLOOD LEFT FOREARM    Special Requests      BOTTLES DRAWN AEROBIC AND ANAEROBIC Blood Culture results may not be optimal due to an inadequate volume of blood received in culture bottles   Culture      NO GROWTH 5 DAYS Performed at [facility], [address] [place], [place] [address]    Report Status 12/21/2020 FINAL   Culture, blood (routine x 2)     Status: None   Collection Time: 12/16/20  6:59 PM   Specimen: BLOOD  Result Value Ref Range   Specimen Description BLOOD LEFT  ANTECUBITAL    Special Requests      BOTTLES DRAWN AEROBIC AND ANAEROBIC Blood  Culture results may not be optimal due to an inadequate volume of blood received in culture bottles   Culture      NO GROWTH 5 DAYS Performed at [facility], [address] [place], [place] [address]    Report Status 12/21/2020 FINAL   Sedimentation rate     Status: Abnormal   Collection Time: 12/16/20  6:59 PM  Result Value Ref Range   Sed Rate 36 (H) 0 - 16 mm/hr    Comment: Performed at [facility] [address], [place], [place] [address]  C-reactive protein     Status: Abnormal   Collection Time: 12/16/20  6:59 PM  Result Value Ref Range   CRP 6.5 (H) <1.0 mg/dL    Comment: Performed at [facility] [address], [place], [place] [address]  Lactic acid, plasma     Status: Abnormal   Collection Time: 12/16/20  6:59 PM  Result Value Ref Range   Lactic Acid, Venous 2.3 (HH) 0.5 - 1.9 mmol/L    Comment: CRITICAL RESULT CALLED TO, READ BACK BY AND VERIFIED WITH ASHLEY MURREY 12/16/20 AT 1939 BY HS Performed at [facility], [address] [place], [place] [address]   Procalcitonin     Status: None   Collection Time: 12/16/20  6:59 PM  Result Value Ref Range   Procalcitonin <0.10 ng/mL    Comment:        Interpretation: PCT (Procalcitonin) <= 0.5 ng/mL: Systemic infection (sepsis) is not likely. Local bacterial infection is possible. (NOTE)       Sepsis PCT Algorithm           Lower Respiratory Tract                                      Infection PCT Algorithm    ----------------------------     ----------------------------         PCT < 0.25 ng/mL                PCT < 0.10 ng/mL          Strongly encourage             Strongly discourage   discontinuation of antibiotics    initiation of antibiotics    ----------------------------     -----------------------------       PCT 0.25 - 0.50 ng/mL            PCT 0.10 - 0.25 ng/mL               OR       >80% decrease in PCT            Discourage initiation of                                             antibiotics      Encourage discontinuation           of antibiotics    ----------------------------     -----------------------------         PCT >= 0.50 ng/mL              PCT 0.26 - 0.50  ng/mL               AND        <80% decrease in PCT             Encourage initiation of                                             antibiotics       Encourage continuation           of antibiotics    ----------------------------     -----------------------------        PCT >= 0.50 ng/mL                  PCT > 0.50 ng/mL               AND         increase in PCT                  Strongly encourage                                      initiation of antibiotics    Strongly encourage escalation           of antibiotics                                     -----------------------------                                           PCT <= 0.25 ng/mL                                                 OR                                        > 80% decrease in PCT                                      Discontinue / Do not initiate                                             antibiotics  Performed at [facility], [address] [place], [place] [address]   Lactic acid, plasma     Status: None   Collection Time: 12/16/20 10:06 PM  Result Value Ref Range   Lactic Acid, Venous 1.9 0.5 - 1.9 mmol/L    Comment: Performed at [facility], [address], [place], [place] [address]  Basic metabolic panel     Status: Abnormal   Collection Time: 12/17/20  5:38 AM  Result Value Ref Range  Sodium 136 135 - 145 mmol/L   Potassium 4.8 3.5 - 5.1 mmol/L   Chloride 110 98 - 111 mmol/L   CO2 19 (L) 22 - 32 mmol/L   Glucose, Bld 103 (H) 70 - 99 mg/dL    Comment: Glucose reference range applies only to samples taken after fasting for at least 8 hours.   BUN 50 (H) 8 - 23 mg/dL   Creatinine, Ser 1.23 0.61 - 1.24 mg/dL   Calcium 8.4 (L) 8.9 - 10.3 mg/dL   GFR, Estimated 57 (L) >60 mL/min     Comment: (NOTE) Calculated using the CKD-EPI Creatinine Equation (2021)    Anion gap 7 5 - 15    Comment: Performed at [facility], [address] [place], [place] [address]  CBC     Status: Abnormal   Collection Time: 12/17/20  5:38 AM  Result Value Ref Range   WBC 8.2 4.0 - 10.5 K/uL   RBC 4.14 (L) 4.22 - 5.81 MIL/uL   Hemoglobin 12.8 (L) 13.0 - 17.0 g/dL   HCT 37.0 (L) 39.0 - 52.0 %   MCV 89.4 80.0 - 100.0 fL   MCH 30.9 26.0 - 34.0 pg   MCHC 34.6 30.0 - 36.0 g/dL   RDW 15.3 11.5 - 15.5 %   Platelets 189 150 - 400 K/uL   nRBC 0.0 0.0 - 0.2 %    Comment: Performed at [facility], [address] [place], [place] [address]  Hemoglobin A1c     Status: Abnormal   Collection Time: 12/17/20  5:38 AM  Result Value Ref Range   Hgb A1c MFr Bld 5.7 (H) 4.8 - 5.6 %    Comment: (NOTE) Pre diabetes:          5.7%-6.4%  Diabetes:              >6.4%  Glycemic control for   <7.0% adults with diabetes    Mean Plasma Glucose 116.89 mg/dL    Comment: Performed at [facility], [place] [address] [place], [place] [address]  Glucose, capillary     Status: Abnormal   Collection Time: 12/17/20 12:18 PM  Result Value Ref Range   Glucose-Capillary 114 (H) 70 - 99 mg/dL    Comment: Glucose reference range applies only to samples taken after fasting for at least 8 hours.  Glucose, capillary     Status: None   Collection Time: 12/17/20  4:25 PM  Result Value Ref Range   Glucose-Capillary 86 70 - 99 mg/dL    Comment: Glucose reference range applies only to samples taken after fasting for at least 8 hours.  Glucose, capillary     Status: None   Collection Time: 12/18/20 12:14 AM  Result Value Ref Range   Glucose-Capillary 97 70 - 99 mg/dL    Comment: Glucose reference range applies only to samples taken after fasting for at least 8 hours.  Glucose, capillary     Status: Abnormal   Collection Time: 12/18/20  7:48 AM  Result Value Ref Range   Glucose-Capillary 68 (L) 70  - 99 mg/dL    Comment: Glucose reference range applies only to samples taken after fasting for at least 8 hours.   Comment 1 Notify RN   Glucose, capillary     Status: Abnormal   Collection Time: 12/18/20  8:30 AM  Result Value Ref Range   Glucose-Capillary 100 (H) 70 - 99 mg/dL    Comment: Glucose reference range applies only to  samples taken after fasting for at least 8 hours.  Glucose, capillary     Status: Abnormal   Collection Time: 12/18/20 11:34 AM  Result Value Ref Range   Glucose-Capillary 117 (H) 70 - 99 mg/dL    Comment: Glucose reference range applies only to samples taken after fasting for at least 8 hours.    Assessment/Plan:  Bilateral leg edema Patient has marked leg swelling bilaterally likely from a variety of medical issues and now with lymphedema from chronic scarring and lymphatic channels.  Venous work-up with reflux study will be planned as well.  He has current skin breakdown and so Unna boots will be placed today and should be changed weekly.  We will get an ultrasound in the upcoming weeks for further evaluation.  He will follow up after the ultrasound.  Chronic systolic heart failure (HCC) Has been present for many years and is a major contributing factor to his lower extremity swelling.  HTN (hypertension) blood pressure control important in reducing the progression of atherosclerotic disease. On appropriate oral medications.   Stage 3b chronic kidney disease (HCC) A contributing factor to his LE edema.  HLD (hyperlipidemia) lipid control important in reducing the progression of atherosclerotic disease. Continue statin therapy   Lower limb ulcer, calf (HCC) Three layer UNNA boots will be placed bilaterally. Change weekly.       Chris Davis 01/16/2021, 3:01 PM   This note was created with Dragon medical transcription system.  Any errors from dictation are unintentional.

## 2021-01-16 NOTE — Assessment & Plan Note (Signed)
blood pressure control important in reducing the progression of atherosclerotic disease. On appropriate oral medications.  

## 2021-01-16 NOTE — Assessment & Plan Note (Signed)
lipid control important in reducing the progression of atherosclerotic disease. Continue statin therapy  

## 2021-01-16 NOTE — Assessment & Plan Note (Signed)
Patient has marked leg swelling bilaterally likely from a variety of medical issues and now with lymphedema from chronic scarring and lymphatic channels.  Venous work-up with reflux study will be planned as well.  He has current skin breakdown and so Unna boots will be placed today and should be changed weekly.  We will get an ultrasound in the upcoming weeks for further evaluation.  He will follow up after the ultrasound.

## 2021-01-16 NOTE — Assessment & Plan Note (Signed)
Three layer UNNA boots will be placed bilaterally. Change weekly.

## 2021-01-16 NOTE — Patient Instructions (Signed)

## 2021-01-30 ENCOUNTER — Other Ambulatory Visit (INDEPENDENT_AMBULATORY_CARE_PROVIDER_SITE_OTHER): Payer: Self-pay | Admitting: Vascular Surgery

## 2021-01-30 DIAGNOSIS — R600 Localized edema: Secondary | ICD-10-CM

## 2021-01-31 ENCOUNTER — Ambulatory Visit (INDEPENDENT_AMBULATORY_CARE_PROVIDER_SITE_OTHER): Payer: Medicare PPO

## 2021-01-31 ENCOUNTER — Ambulatory Visit (INDEPENDENT_AMBULATORY_CARE_PROVIDER_SITE_OTHER): Payer: Medicare PPO | Admitting: Nurse Practitioner

## 2021-01-31 ENCOUNTER — Other Ambulatory Visit: Payer: Self-pay

## 2021-01-31 ENCOUNTER — Encounter (INDEPENDENT_AMBULATORY_CARE_PROVIDER_SITE_OTHER): Payer: Self-pay | Admitting: Nurse Practitioner

## 2021-01-31 VITALS — BP 114/80 | HR 80 | Resp 16

## 2021-01-31 DIAGNOSIS — I890 Lymphedema, not elsewhere classified: Secondary | ICD-10-CM

## 2021-01-31 DIAGNOSIS — R600 Localized edema: Secondary | ICD-10-CM

## 2021-01-31 DIAGNOSIS — L97201 Non-pressure chronic ulcer of unspecified calf limited to breakdown of skin: Secondary | ICD-10-CM | POA: Diagnosis not present

## 2021-01-31 DIAGNOSIS — I10 Essential (primary) hypertension: Secondary | ICD-10-CM

## 2021-01-31 DIAGNOSIS — E785 Hyperlipidemia, unspecified: Secondary | ICD-10-CM

## 2021-02-04 ENCOUNTER — Encounter (INDEPENDENT_AMBULATORY_CARE_PROVIDER_SITE_OTHER): Payer: Self-pay | Admitting: Nurse Practitioner

## 2021-02-04 NOTE — Progress Notes (Signed)
Subjective:    Patient ID: Chris Davis, male    DOB: Jul 16, 1932, 87 y.o.   MRN: [id number] Chief Complaint  Patient presents with  . Follow-up    Ultrasound follow up    Chris Davis is a 87 y.o. male.  The patient returns today for noninvasive studies related to pronounced lower extremity edema.  The patient has had heart failure for over 40 years.  They note that recently the swelling has worsened and the patient developed ulcerations with weeping.  The weeping has resolved mostly but he still continues to have some wounds present.  The skin was nearly healed however he developed some skin tears during an Unna boot change and has several small ulcerations on the left calf.  The patient still continues to have severe swelling.  No previous DVTs bilaterally.  The patient also has atrial fibrillation as well as chronic kidney disease.  He has previously had radiation to the pelvis for prostate cancer.  Today noninvasive studies show no evidence of DVT or superficial thrombophlebitis seen bilaterally.  There is evidence of deep venous insufficiency noted in the common femoral vein bilaterally.  No evidence of superficial venous reflux.      Review of Systems  Cardiovascular: Positive for leg swelling.  Musculoskeletal: Positive for gait problem.  All other systems reviewed and are negative.      Objective:   Physical Exam Vitals reviewed.  HENT:     Head: Normocephalic.  Cardiovascular:     Rate and Rhythm: Normal rate.     Pulses: Normal pulses.  Pulmonary:     Effort: Pulmonary effort is normal.  Musculoskeletal:     Right lower leg: 2+ Pitting Edema present.     Left lower leg: 2+ Pitting Edema present.  Neurological:     Mental Status: He is alert and oriented to person, place, and time.     Gait: Gait abnormal.  Psychiatric:        Mood and Affect: Mood normal.        Behavior: Behavior normal.        Thought Content: Thought content normal.        Judgment: Judgment  normal.     BP 114/80 (BP Location: Left Arm)   Pulse 80   Resp 16   Past Medical History:  Diagnosis Date  . Arrhythmia    atrial fibrillation  . Hx of radiation therapy   . Hyperlipidemia   . Hypertension   . Prostate cancer ([place])   . Prostate cancer [facility])     Social History   Socioeconomic History  . Marital status: Married    Spouse name: Not on file  . Number of children: Not on file  . Years of education: Not on file  . Highest education level: Not on file  Occupational History  . Not on file  Tobacco Use  . Smoking status: Never Smoker  . Smokeless tobacco: Never Used  Substance and Sexual Activity  . Alcohol use: Yes    Comment: occ  . Drug use: No  . Sexual activity: Not on file  Other Topics Concern  . Not on file  Social History Narrative  . Not on file   Social Determinants of Health   Financial Resource Strain: Not on file  Food Insecurity: Not on file  Transportation Needs: Not on file  Physical Activity: Not on file  Stress: Not on file  Social Connections: Not on file  Intimate Partner Violence: Not on  file    Past Surgical History:  Procedure Laterality Date  . CARDIOVERSION N/A 09/10/2017   Procedure: Cardioversion;  Surgeon: Corey Skains, MD;  Location: [facility] ORS;  Service: Cardiovascular;  Laterality: N/A;  . CAROTID ARTERY ANGIOPLASTY    . CATARACT EXTRACTION Bilateral   . INSERTION PROSTATE RADIATION SEED    . TONSILLECTOMY      Family History  Problem Relation Age of Onset  . Breast cancer Sister   . Throat cancer Paternal Grandmother     No Known Allergies  CBC Latest Ref Rng & Units 12/17/2020 12/16/2020 11/02/2020  WBC 4.0 - 10.5 K/uL 8.2 7.6 7.2  Hemoglobin 13.0 - 17.0 g/dL 12.8(L) 13.7 14.2  Hematocrit 39.0 - 52.0 % 37.0(L) 40.4 42.6  Platelets 150 - 400 K/uL 189 209 166      CMP     Component Value Date/Time   NA 136 12/17/2020 0538   K 4.8 12/17/2020 0538   CL 110 12/17/2020 0538   CO2 19 (L) 12/17/2020  0538   GLUCOSE 103 (H) 12/17/2020 0538   BUN 50 (H) 12/17/2020 0538   CREATININE 1.23 12/17/2020 0538   CALCIUM 8.4 (L) 12/17/2020 0538   PROT 6.2 (L) 12/16/2020 0841   ALBUMIN 3.3 (L) 12/16/2020 0841   AST 36 12/16/2020 0841   ALT 20 12/16/2020 0841   ALKPHOS 62 12/16/2020 0841   BILITOT 1.6 (H) 12/16/2020 0841   GFRNONAA 57 (L) 12/17/2020 0538   GFRAA >60 07/22/2017 0758     No results found.     Assessment & Plan:   1. Skin ulcer of calf, limited to breakdown of skin, unspecified laterality ([place]) Currently the patient has Unna wraps due to several small ulcerations.  The patient will remain in [employer] wraps until his ulcerations have healed at which point he will transition to medical grade 1 compression stockings as outlined below.  2. Lymphedema I have had a long discussion with the patient regarding swelling and why it  causes symptoms.  Patient will begin wearing graduated compression stockings class 1 (20-30 mmHg) on a daily basis a prescription was given. The patient will  beginning wearing the stockings first thing in the morning and removing them in the evening. The patient is instructed specifically not to sleep in the stockings.   In addition, behavioral modification will be initiated.  This will include frequent elevation, use of over the counter pain medications and exercise such as walking.  I have reviewed systemic causes for chronic edema such as liver, kidney and cardiac etiologies.  The patient denies problems with these organ systems.    Consideration for a lymph pump will also be made based upon the effectiveness of conservative therapy.  This would help to improve the edema control and prevent sequela such as ulcers and infections   We will send a referral to the lymphedema clinic as well.  We will plan on having the patient follow-up in 3 months.  - Ambulatory referral to Occupational Therapy  3. Primary hypertension Continue antihypertensive medications as  already ordered, these medications have been reviewed and there are no changes at this time.   4. Hyperlipidemia, unspecified hyperlipidemia type Continue statin as ordered and reviewed, no changes at this time    Current Outpatient Medications on File Prior to Visit  Medication Sig Dispense Refill  . acetaminophen (TYLENOL) 500 MG tablet Take 500 mg by mouth every 6 (six) hours as needed (for pain/headaches.).    Marland Kitchen apixaban (ELIQUIS) 5 MG  TABS tablet Take 1 tablet (5 mg total) by mouth 2 (two) times daily. 60 tablet 0  . atorvastatin (LIPITOR) 20 MG tablet Take 20 mg by mouth daily.    . carvedilol (COREG) 3.125 MG tablet Take 1 tablet (3.125 mg total) by mouth 2 (two) times daily with a meal. 60 tablet 0  . JARDIANCE 10 MG TABS tablet Take 10 mg by mouth daily with breakfast.    . lisinopril (ZESTRIL) 2.5 MG tablet Take 1 tablet (2.5 mg total) by mouth at bedtime. 30 tablet 1  . spironolactone (ALDACTONE) 25 MG tablet Take 25 mg by mouth daily.    Marland Kitchen torsemide (DEMADEX) 20 MG tablet Take 2 tablets (40 mg total) by mouth daily. 60 tablet 0   No current facility-administered medications on file prior to visit.    There are no Patient Instructions on file for this visit. No follow-ups on file.   Kris Hartmann, NP

## 2021-04-18 ENCOUNTER — Other Ambulatory Visit: Payer: Self-pay | Admitting: Ophthalmology

## 2021-04-18 DIAGNOSIS — H532 Diplopia: Secondary | ICD-10-CM

## 2021-04-25 ENCOUNTER — Other Ambulatory Visit: Payer: Self-pay

## 2021-04-25 ENCOUNTER — Ambulatory Visit
Admission: RE | Admit: 2021-04-25 | Discharge: 2021-04-25 | Disposition: A | Discharge status: Home / Self Care | Payer: Medicare PPO | Source: Ambulatory Visit | Attending: Ophthalmology | Admitting: Ophthalmology

## 2021-04-25 DIAGNOSIS — H532 Diplopia: Secondary | ICD-10-CM | POA: Insufficient documentation

## 2021-04-25 MED ORDER — GADOBUTROL 1 MMOL/ML IV SOLN: 7.5000 mL | Freq: Once | INTRAVENOUS | Status: DC | PRN

## 2021-04-25 MED ORDER — GADOBENATE DIMEGLUMINE 529 MG/ML IV SOLN
8.0000 mL | Freq: Once | INTRAVENOUS | Status: AC | PRN
  Administered 2021-04-25: 8 mL via INTRAVENOUS

## 2021-05-01 ENCOUNTER — Ambulatory Visit (INDEPENDENT_AMBULATORY_CARE_PROVIDER_SITE_OTHER): Payer: Medicare PPO | Admitting: Vascular Surgery

## 2021-05-01 ENCOUNTER — Encounter (INDEPENDENT_AMBULATORY_CARE_PROVIDER_SITE_OTHER): Payer: Self-pay | Admitting: Vascular Surgery

## 2021-05-01 ENCOUNTER — Other Ambulatory Visit: Payer: Self-pay

## 2021-05-01 VITALS — BP 92/61 | HR 111 | Ht 69.0 in | Wt 174.0 lb

## 2021-05-01 DIAGNOSIS — I5022 Chronic systolic (congestive) heart failure: Secondary | ICD-10-CM | POA: Diagnosis not present

## 2021-05-01 DIAGNOSIS — N1832 Chronic kidney disease, stage 3b: Secondary | ICD-10-CM

## 2021-05-01 DIAGNOSIS — R600 Localized edema: Secondary | ICD-10-CM

## 2021-05-01 DIAGNOSIS — I10 Essential (primary) hypertension: Secondary | ICD-10-CM

## 2021-05-01 DIAGNOSIS — E785 Hyperlipidemia, unspecified: Secondary | ICD-10-CM | POA: Diagnosis not present

## 2021-05-01 NOTE — Progress Notes (Signed)
MRN : [id number]  Chris Davis is a 87 y.o. (1932-01-21) male who presents with chief complaint of  Chief Complaint  Patient presents with  . Follow-up    3 Mo no studies  .  History of Present Illness: Patient returns today in follow up of his leg swelling and ulceration. He is doing better. Home health is doing his wraps and he had a new one placed yesterday. His swelling is better but still present. No new complaints today.  Current Outpatient Medications  Medication Sig Dispense Refill  . acetaminophen (TYLENOL) 500 MG tablet Take 500 mg by mouth every 6 (six) hours as needed (for pain/headaches.).    Marland Kitchen apixaban (ELIQUIS) 5 MG TABS tablet Take 1 tablet (5 mg total) by mouth 2 (two) times daily. 60 tablet 0  . atorvastatin (LIPITOR) 20 MG tablet Take 20 mg by mouth daily.    . carvedilol (COREG) 3.125 MG tablet Take 1 tablet (3.125 mg total) by mouth 2 (two) times daily with a meal. 60 tablet 0  . JARDIANCE 10 MG TABS tablet Take 10 mg by mouth daily with breakfast.    . lisinopril (ZESTRIL) 2.5 MG tablet Take 1 tablet (2.5 mg total) by mouth at bedtime. 30 tablet 1  . spironolactone (ALDACTONE) 25 MG tablet Take 25 mg by mouth daily.    Marland Kitchen torsemide (DEMADEX) 20 MG tablet Take 2 tablets (40 mg total) by mouth daily. 60 tablet 0   No current facility-administered medications for this visit.    Past Medical History:  Diagnosis Date  . Arrhythmia    atrial fibrillation  . Hx of radiation therapy   . Hyperlipidemia   . Hypertension   . Prostate cancer ([place])   . Prostate cancer [facility])     Past Surgical History:  Procedure Laterality Date  . CARDIOVERSION N/A 09/10/2017   Procedure: Cardioversion;  Surgeon: Corey Skains, MD;  Location: [facility] ORS;  Service: Cardiovascular;  Laterality: N/A;  . CAROTID ARTERY ANGIOPLASTY    . CATARACT EXTRACTION Bilateral   . INSERTION PROSTATE RADIATION SEED    . TONSILLECTOMY       Social History   Tobacco Use  . Smoking status:  Never Smoker  . Smokeless tobacco: Never Used  Substance Use Topics  . Alcohol use: Yes    Comment: occ  . Drug use: No      Family History  Problem Relation Age of Onset  . Breast cancer Sister   . Throat cancer Paternal Grandmother      No Known Allergies   REVIEW OF SYSTEMS (Negative unless checked)  Constitutional: [] ?Weight loss  [] ?Fever  [] ?Chills Cardiac: [] ?Chest pain   [] ?Chest pressure   [x] ?Palpitations   [] ?Shortness of breath when laying flat   [] ?Shortness of breath at rest   [x] ?Shortness of breath with exertion. Vascular:  [] ?Pain in legs with walking   [] ?Pain in legs at rest   [] ?Pain in legs when laying flat   [] ?Claudication   [] ?Pain in feet when walking  [] ?Pain in feet at rest  [] ?Pain in feet when laying flat   [] ?History of DVT   [] ?Phlebitis   [x] ?Swelling in legs   [] ?Varicose veins   [x] ?Non-healing ulcers Pulmonary:   [] ?Uses home oxygen   [] ?Productive cough   [] ?Hemoptysis   [] ?Wheeze  [] ?COPD   [] ?Asthma Neurologic:  [] ?Dizziness  [] ?Blackouts   [] ?Seizures   [] ?History of stroke   [] ?History of TIA  [] ?Aphasia   [] ?Temporary blindness   [] ?  Dysphagia   [] ?Weakness or numbness in arms   [] ?Weakness or numbness in legs Musculoskeletal:  [x] ?Arthritis   [] ?Joint swelling   [] ?Joint pain   [] ?Low back pain Hematologic:  [] ?Easy bruising  [] ?Easy bleeding   [] ?Hypercoagulable state   [] ?Anemic  [] ?Hepatitis Gastrointestinal:  [] ?Blood in stool   [] ?Vomiting blood  [] ?Gastroesophageal reflux/heartburn   [] ?Abdominal pain Genitourinary:  [] ?Chronic kidney disease   [] ?Difficult urination  [] ?Frequent urination  [] ?Burning with urination   [] ?Hematuria Skin:  [] ?Rashes   [x] ?Ulcers   [x] ?Wounds Psychological:  [] ?History of anxiety   [] ? History of major depression.  Physical Examination  BP 92/61   Pulse (!) 111   Ht 5\' 9"  (1.753 m)   Wt 174 lb (78.9 kg)   BMI 25.70 kg/m  Gen:  WD/WN, NAD. Appears younger than stated age. Head: [place]/AT, No  temporalis wasting. Ear/Nose/Throat: Hearing grossly intact, nares w/o erythema or drainage Eyes: Conjunctiva clear. Sclera non-icteric Neck: Supple.  Trachea midline Pulmonary:  Good air movement, no use of accessory muscles.  Cardiac: Irregular, tachycardic, No JVD Vascular:  Vessel Right Left  Radial Palpable Palpable                          PT Palpable Palpable  DP Palpable Palpable    Musculoskeletal: M/S 5/5 throughout.  No deformity or atrophy. In UNNA wraps, 1-2+ BLE edema. Neurologic: Sensation grossly intact in extremities.  Symmetrical.  Speech is fluent.  Psychiatric: Judgment intact, Mood & affect appropriate for pt's clinical situation. Dermatologic: No rashes or ulcers noted.  UNNA boots in place.       Labs No results found for this or any previous visit (from the past 2160 hour(s)).  Radiology MR BRAIN W WO CONTRAST  Result Date: 04/26/2021 CLINICAL DATA:  Double vision EXAM: MRI HEAD WITHOUT AND WITH CONTRAST TECHNIQUE: Multiplanar, multiecho pulse sequences of the brain and surrounding structures were obtained without and with intravenous contrast. CONTRAST:  38mL MULTIHANCE GADOBENATE DIMEGLUMINE 529 MG/ML IV SOLN COMPARISON:  None. FINDINGS: Brain: There is no acute infarction or intracranial hemorrhage. There is no intracranial mass, mass effect, or edema. There is no hydrocephalus or extra-axial fluid collection. Prominence of the ventricles and sulci reflects generalized parenchymal volume loss. Patchy and confluent areas of T2 hyperintensity in the supratentorial white matter are nonspecific but may reflect mild to moderate chronic microvascular ischemic changes. Small chronic right parietal cortical infarct. No abnormal enhancement. Vascular: Major vessel flow voids at the skull base are preserved. Skull and upper cervical spine: Normal marrow signal is preserved. Sinuses/Orbits: Mild mucosal thickening. Bilateral lens replacements. Other: Sella is  unremarkable.  Left mastoid tip fluid opacification. IMPRESSION: No evidence of recent infarction, hemorrhage, or mass. No abnormal enhancement. Chronic microvascular ischemic changes. Small chronic right parietal infarct. Electronically Signed   By: Macy Mis M.D.   On: 04/26/2021 11:02    Assessment/Plan Chronic systolic heart failure ([place]) Has been present for many years and is a major contributing factor to his lower extremity swelling.  HTN (hypertension) blood pressure control important in reducing the progression of atherosclerotic disease. On appropriate oral medications.   Stage 3b chronic kidney disease ([facility]) A contributing factor to his LE edema.  HLD (hyperlipidemia) lipid control important in reducing the progression of atherosclerotic disease. Continue statin therapy  No problem-specific Assessment & Plan notes found for this encounter.    Leotis Pain, MD  05/01/2021 4:20 PM    This note was  created with Dragon medical transcription system.  Any errors from dictation are purely unintentional

## 2021-06-11 ENCOUNTER — Inpatient Hospital Stay
Admission: EM | Admit: 2021-06-11 | Discharge: 2021-06-19 | DRG: 291 | Disposition: A | Discharge status: Skilled Nursing Facility | Payer: Medicare PPO | Source: Skilled Nursing Facility | Attending: Internal Medicine | Admitting: Internal Medicine

## 2021-06-11 ENCOUNTER — Emergency Department: Payer: Medicare PPO

## 2021-06-11 ENCOUNTER — Other Ambulatory Visit: Payer: Self-pay

## 2021-06-11 DIAGNOSIS — I952 Hypotension due to drugs: Secondary | ICD-10-CM | POA: Diagnosis present

## 2021-06-11 DIAGNOSIS — W19XXXA Unspecified fall, initial encounter: Secondary | ICD-10-CM | POA: Diagnosis present

## 2021-06-11 DIAGNOSIS — E869 Volume depletion, unspecified: Secondary | ICD-10-CM | POA: Diagnosis present

## 2021-06-11 DIAGNOSIS — I4891 Unspecified atrial fibrillation: Secondary | ICD-10-CM | POA: Diagnosis present

## 2021-06-11 DIAGNOSIS — E1122 Type 2 diabetes mellitus with diabetic chronic kidney disease: Secondary | ICD-10-CM | POA: Diagnosis present

## 2021-06-11 DIAGNOSIS — Z7984 Long term (current) use of oral hypoglycemic drugs: Secondary | ICD-10-CM

## 2021-06-11 DIAGNOSIS — Z8546 Personal history of malignant neoplasm of prostate: Secondary | ICD-10-CM

## 2021-06-11 DIAGNOSIS — I10 Essential (primary) hypertension: Secondary | ICD-10-CM | POA: Diagnosis present

## 2021-06-11 DIAGNOSIS — N1831 Chronic kidney disease, stage 3a: Secondary | ICD-10-CM | POA: Diagnosis present

## 2021-06-11 DIAGNOSIS — I509 Heart failure, unspecified: Secondary | ICD-10-CM

## 2021-06-11 DIAGNOSIS — R609 Edema, unspecified: Secondary | ICD-10-CM | POA: Diagnosis present

## 2021-06-11 DIAGNOSIS — Z7901 Long term (current) use of anticoagulants: Secondary | ICD-10-CM

## 2021-06-11 DIAGNOSIS — Z923 Personal history of irradiation: Secondary | ICD-10-CM

## 2021-06-11 DIAGNOSIS — I5043 Acute on chronic combined systolic (congestive) and diastolic (congestive) heart failure: Secondary | ICD-10-CM | POA: Diagnosis present

## 2021-06-11 DIAGNOSIS — R2681 Unsteadiness on feet: Secondary | ICD-10-CM

## 2021-06-11 DIAGNOSIS — I959 Hypotension, unspecified: Secondary | ICD-10-CM | POA: Diagnosis present

## 2021-06-11 DIAGNOSIS — E785 Hyperlipidemia, unspecified: Secondary | ICD-10-CM | POA: Diagnosis present

## 2021-06-11 DIAGNOSIS — I130 Hypertensive heart and chronic kidney disease with heart failure and stage 1 through stage 4 chronic kidney disease, or unspecified chronic kidney disease: Secondary | ICD-10-CM | POA: Diagnosis not present

## 2021-06-11 DIAGNOSIS — R0602 Shortness of breath: Secondary | ICD-10-CM | POA: Diagnosis not present

## 2021-06-11 DIAGNOSIS — Z808 Family history of malignant neoplasm of other organs or systems: Secondary | ICD-10-CM

## 2021-06-11 DIAGNOSIS — J189 Pneumonia, unspecified organism: Secondary | ICD-10-CM

## 2021-06-11 DIAGNOSIS — Z20822 Contact with and (suspected) exposure to covid-19: Secondary | ICD-10-CM | POA: Diagnosis present

## 2021-06-11 DIAGNOSIS — Z803 Family history of malignant neoplasm of breast: Secondary | ICD-10-CM

## 2021-06-11 DIAGNOSIS — R29898 Other symptoms and signs involving the musculoskeletal system: Secondary | ICD-10-CM

## 2021-06-11 DIAGNOSIS — Z66 Do not resuscitate: Secondary | ICD-10-CM | POA: Diagnosis present

## 2021-06-11 DIAGNOSIS — I4811 Longstanding persistent atrial fibrillation: Secondary | ICD-10-CM

## 2021-06-11 DIAGNOSIS — R531 Weakness: Secondary | ICD-10-CM

## 2021-06-11 DIAGNOSIS — T502X5A Adverse effect of carbonic-anhydrase inhibitors, benzothiadiazides and other diuretics, initial encounter: Secondary | ICD-10-CM | POA: Diagnosis present

## 2021-06-11 DIAGNOSIS — R0902 Hypoxemia: Secondary | ICD-10-CM

## 2021-06-11 DIAGNOSIS — E876 Hypokalemia: Secondary | ICD-10-CM | POA: Diagnosis not present

## 2021-06-11 DIAGNOSIS — E871 Hypo-osmolality and hyponatremia: Secondary | ICD-10-CM | POA: Diagnosis present

## 2021-06-11 DIAGNOSIS — I5023 Acute on chronic systolic (congestive) heart failure: Secondary | ICD-10-CM | POA: Diagnosis present

## 2021-06-11 DIAGNOSIS — Z79899 Other long term (current) drug therapy: Secondary | ICD-10-CM

## 2021-06-11 HISTORY — DX: Other persistent atrial fibrillation: I48.19

## 2021-06-11 LAB — CBC WITH DIFFERENTIAL/PLATELET
Abs Immature Granulocytes: 0.03 10*3/uL (ref 0.00–0.07)
Basophils Absolute: 0 10*3/uL (ref 0.0–0.1)
Basophils Relative: 0 %
Eosinophils Absolute: 0.2 10*3/uL (ref 0.0–0.5)
Eosinophils Relative: 2 %
HCT: 44.7 % (ref 39.0–52.0)
Hemoglobin: 14.7 g/dL (ref 13.0–17.0)
Immature Granulocytes: 0 %
Lymphocytes Relative: 10 %
Lymphs Abs: 0.8 10*3/uL (ref 0.7–4.0)
MCH: 29.6 pg (ref 26.0–34.0)
MCHC: 32.9 g/dL (ref 30.0–36.0)
MCV: 90.1 fL (ref 80.0–100.0)
Monocytes Absolute: 1.1 10*3/uL — ABNORMAL HIGH (ref 0.1–1.0)
Monocytes Relative: 14 %
Neutro Abs: 5.7 10*3/uL (ref 1.7–7.7)
Neutrophils Relative %: 74 %
Platelets: 238 10*3/uL (ref 150–400)
RBC: 4.96 MIL/uL (ref 4.22–5.81)
RDW: 15.6 % — ABNORMAL HIGH (ref 11.5–15.5)
WBC: 7.8 10*3/uL (ref 4.0–10.5)
nRBC: 0 % (ref 0.0–0.2)

## 2021-06-11 LAB — URINALYSIS, COMPLETE (UACMP) WITH MICROSCOPIC
Bacteria, UA: NONE SEEN
Bilirubin Urine: NEGATIVE
Glucose, UA: >=500 mg/dL — AB
Hgb urine dipstick: NEGATIVE
Ketones, ur: 5 mg/dL — AB
Leukocytes,Ua: NEGATIVE
Nitrite: NEGATIVE
Protein, ur: NEGATIVE mg/dL
Specific Gravity, Urine: 1.022 (ref 1.005–1.030)
pH: 5 (ref 5.0–8.0)

## 2021-06-11 LAB — COMPREHENSIVE METABOLIC PANEL
ALT: 17 U/L (ref 0–44)
AST: 25 U/L (ref 15–41)
Albumin: 3.7 g/dL (ref 3.5–5.0)
Alkaline Phosphatase: 91 U/L (ref 38–126)
Anion gap: 6 (ref 5–15)
BUN: 30 mg/dL — ABNORMAL HIGH (ref 8–23)
CO2: 23 mmol/L (ref 22–32)
Calcium: 8.8 mg/dL — ABNORMAL LOW (ref 8.9–10.3)
Chloride: 104 mmol/L (ref 98–111)
Creatinine, Ser: 1.08 mg/dL (ref 0.61–1.24)
GFR, Estimated: >60 mL/min (ref >60)
Glucose, Bld: 87 mg/dL (ref 70–99)
Potassium: 4.8 mmol/L (ref 3.5–5.1)
Sodium: 133 mmol/L — ABNORMAL LOW (ref 135–145)
Total Bilirubin: 1.4 mg/dL — ABNORMAL HIGH (ref 0.3–1.2)
Total Protein: 6.8 g/dL (ref 6.5–8.1)

## 2021-06-11 LAB — TROPONIN I (HIGH SENSITIVITY): Troponin I (High Sensitivity): 7 ng/L (ref <18)

## 2021-06-11 LAB — MAGNESIUM: Magnesium: 2.3 mg/dL (ref 1.7–2.4)

## 2021-06-11 MED ORDER — LISINOPRIL 5 MG PO TABS
2.5000 mg | ORAL_TABLET | Freq: Every day | ORAL | Status: DC
  Administered 2021-06-11: 2.5 mg via ORAL
  Filled 2021-06-11: qty 1

## 2021-06-11 MED ORDER — CARVEDILOL 6.25 MG PO TABS
3.1250 mg | ORAL_TABLET | Freq: Two times a day (BID) | ORAL | Status: DC
  Administered 2021-06-11: 3.125 mg via ORAL
  Filled 2021-06-11: qty 1

## 2021-06-11 MED ORDER — APIXABAN 5 MG PO TABS
5.0000 mg | ORAL_TABLET | Freq: Two times a day (BID) | ORAL | Status: DC
  Administered 2021-06-12 – 2021-06-19 (×15): 5 mg via ORAL
  Filled 2021-06-11 (×15): qty 1

## 2021-06-11 NOTE — ED Notes (Addendum)
Pt ambulated to [facility] and back, O2 sat avg was 92%. Pt. 02 dropped to 83% mid ambulation. MD notified.

## 2021-06-11 NOTE — ED Provider Notes (Signed)
[facility] Emergency Department Provider Note  ____________________________________________   Event Date/Time   First MD Initiated Contact with Patient 06/11/21 1931     (approximate)  I have reviewed the triage vital signs and the nursing notes.   HISTORY  Chief Complaint Weakness (C/o fall this afternoon. Pt. States he had a fall earlier today, when his walker "got away from me" pt. Denies any head trauma, or LOC with either fall. Denies any pain or injury from either fall. Hx of afib.) and Fall   HPI Chris Davis is a 87 y.o. male with a past medical history of prostate cancer status postradiation therapy, A. fib on Eliquis, HTN, HDL, CKD, chronic lower extremity edema, senile purpura, and CHF who presents EMS from assisted living facility after he had 2 falls today.  Patient states he thinks he fell because his walker got away from him.  He did not lose consciousness or feel lightheaded or dizzy before falling.  He does note he has had some weakness in his right leg for the last 2 or 3 weeks which seems new but has not had any other new focal weakness numbness or tingling.  He has not had any recent chest pain, cough, shortness of breath, abdominal pain, back pain, headache earache, sore throat, vomiting, diarrhea, dysuria, rash or any other clear acute sick symptoms.  He states that both times today when he fell he fell gently onto his knees and his lower back but does not have any back pain knee pain and arm or other pain.         Past Medical History:  Diagnosis Date   Arrhythmia    atrial fibrillation   Hx of radiation therapy    Hyperlipidemia    Hypertension    Prostate cancer ([place])    Prostate cancer [facility])     Patient Active Problem List   Diagnosis Date Noted   Lower limb ulcer, calf ([place]) 01/16/2021   Hypotension 12/16/2020   HLD (hyperlipidemia) 12/16/2020   Acute renal failure superimposed on stage 3a chronic kidney disease ([place])  12/16/2020   Cellulitis of lower extremity 12/16/2020   Dizziness 11/28/2020   Stage 3b chronic kidney disease ([place]) 09/14/2020   Senile purpura ([place]) 03/09/2019   Bilateral leg edema 48/54/6270   Chronic systolic heart failure ([place]) 07/31/2017   Atrial fibrillation ([place]) 35/00/9381   Acute systolic CHF (congestive heart failure) ([place]) 07/21/2017   HTN (hypertension) 07/21/2017   Cancer of prostate ([place]) 08/27/2016    Past Surgical History:  Procedure Laterality Date   CARDIOVERSION N/A 09/10/2017   Procedure: Cardioversion;  Surgeon: Corey Skains, MD;  Location: [facility] ORS;  Service: Cardiovascular;  Laterality: N/A;   CAROTID ARTERY ANGIOPLASTY     CATARACT EXTRACTION Bilateral    INSERTION PROSTATE RADIATION SEED     TONSILLECTOMY      Prior to Admission medications   Medication Sig Start Date End Date Taking? Authorizing Provider  acetaminophen (TYLENOL) 500 MG tablet Take 500 mg by mouth every 6 (six) hours as needed (for pain/headaches.).   Yes [provider]  apixaban (ELIQUIS) 5 MG TABS tablet Take 1 tablet (5 mg total) by mouth 2 (two) times daily. 07/22/17  Yes Mody, Ulice Bold, MD  atorvastatin (LIPITOR) 20 MG tablet Take 20 mg by mouth every evening.   Yes [provider]  carvedilol (COREG) 3.125 MG tablet Take 1 tablet (3.125 mg total) by mouth 2 (two) times daily with a meal. 12/18/20  Yes Fritzi Mandes, MD  Cranberry 500 MG CAPS Take 500 mg by mouth daily.   Yes [provider]  empagliflozin (JARDIANCE) 10 MG TABS tablet Take 10 mg by mouth daily with breakfast.   Yes [provider]  lisinopril (ZESTRIL) 2.5 MG tablet Take 1 tablet (2.5 mg total) by mouth at bedtime. 12/18/20  Yes Fritzi Mandes, MD  Multiple Vitamins-Minerals (MULTIVITAMIN WITH MINERALS) tablet Take 1 tablet by mouth daily.   Yes [provider]  spironolactone (ALDACTONE) 25 MG tablet Take 25 mg by mouth daily. 10/08/20  Yes [provider]  torsemide  (DEMADEX) 20 MG tablet Take 2 tablets (40 mg total) by mouth daily. 12/18/20  Yes Fritzi Mandes, MD    Allergies Patient has no known allergies.  Family History  Problem Relation Age of Onset   Breast cancer Sister    Throat cancer Paternal Grandmother     Social History Social History   Tobacco Use   Smoking status: Never   Smokeless tobacco: Never  Substance Use Topics   Alcohol use: Yes    Comment: occ   Drug use: No    Review of Systems  Review of Systems  Constitutional:  Negative for chills and fever.  HENT:  Negative for sore throat.   Eyes:  Negative for pain.  Respiratory:  Negative for cough and stridor.   Cardiovascular:  Negative for chest pain.  Gastrointestinal:  Negative for vomiting.  Musculoskeletal:  Positive for falls.  Skin:  Negative for rash.  Neurological:  Positive for focal weakness (R leg). Negative for seizures, loss of consciousness and headaches.  Psychiatric/Behavioral:  Negative for suicidal ideas.   All other systems reviewed and are negative.    ____________________________________________   PHYSICAL EXAM:  VITAL SIGNS: ED Triage Vitals  Enc Vitals Group     BP      Pulse      Resp      Temp      Temp src      SpO2      Weight      Height      Head Circumference      Peak Flow      Pain Score      Pain Loc      Pain Edu?      Excl. in [place]?    Vitals:   06/11/21 2333 06/11/21 2334  BP: 117/69 117/69  Pulse: (!) 125   Resp:    Temp:    SpO2:     Physical Exam Vitals and nursing note reviewed.  Constitutional:      Appearance: He is well-developed.  HENT:     Head: Normocephalic and atraumatic.     Right Ear: External ear normal.     Left Ear: External ear normal.     Nose: Nose normal.  Eyes:     Conjunctiva/sclera: Conjunctivae normal.  Cardiovascular:     Rate and Rhythm: Tachycardia present. Rhythm irregular.     Heart sounds: No murmur heard. Pulmonary:     Effort: Pulmonary effort is normal. No  respiratory distress.     Breath sounds: Normal breath sounds.  Abdominal:     Palpations: Abdomen is soft.     Tenderness: There is no abdominal tenderness.  Musculoskeletal:     Cervical back: Neck supple.     Right lower leg: Edema present.     Left lower leg: Edema present.  Skin:    General: Skin is warm and  dry.  Neurological:     Mental Status: He is alert and oriented to person, place, and time.  Psychiatric:        Mood and Affect: Mood normal.    Cranial nerves II through XII grossly intact.  No pronator drift.  No finger dysmetria.  Patient has full and symmetric strength in his bilateral upper extremities and throughout his left lower extremity significantly weaker on flexion extension on the right hip and right knee compared to left.  Sensation is intact light touch of extremities.  2+ radial and DP pulses.  No significant or obvious trauma to the face scalp head neck or extremities.  Specifically no tenderness effusion or deformities of the bilateral shoulders, elbows, wrists, hips, knees or ankles.  There is no snuffbox tenderness in the bilateral hands.  No tenderness step-offs or deformities over the C/T/L-spine.  Abdomen is soft and nontender throughout. ____________________________________________   LABS (all labs ordered are listed, but only abnormal results are displayed)  Labs Reviewed  URINALYSIS, COMPLETE (UACMP) WITH MICROSCOPIC - Abnormal; Notable for the following components:      Result Value   Color, Urine YELLOW (*)    APPearance CLEAR (*)    Glucose, UA >=500 (*)    Ketones, ur 5 (*)    All other components within normal limits  CBC WITH DIFFERENTIAL/PLATELET - Abnormal; Notable for the following components:   RDW 15.6 (*)    Monocytes Absolute 1.1 (*)    All other components within normal limits  COMPREHENSIVE METABOLIC PANEL - Abnormal; Notable for the following components:   Sodium 133 (*)    BUN 30 (*)    Calcium 8.8 (*)    Total Bilirubin 1.4  (*)    All other components within normal limits  RESP PANEL BY RT-PCR (FLU A&B, COVID) ARPGX2  MAGNESIUM  TROPONIN I (HIGH SENSITIVITY)   ____________________________________________  EKG  A. fib with a rate of 129, right axis deviation, unremarkable intervals with some nonspecific changes versus artifact throughout. ____________________________________________  RADIOLOGY  ED MD interpretation: Chest x-ray with evidence of cardiomegaly, aortic atherosclerosis and mild continued vascular congestion without focal consolidation or significant edema or large effusion.  No pneumothorax rib rib fracture.  CT head shows generalized atrophy without evidence of CVA hemorrhage or other intracranial abnormality.  Plain film of the right hip shows no fracture or dislocation.  Official radiology report(s): DG Chest 2 View  Result Date: 06/11/2021 CLINICAL DATA:  Multiple falls, weakness EXAM: CHEST - 2 VIEW COMPARISON:  Radiograph 07/21/2017 FINDINGS: Chronically coarsened interstitial and bronchitic changes. Some peripheral septal (Kerley B) line are noted in the lung periphery with pulmonary vascular congestion and cephalization. Cardiomegaly is similar to comparison prior. The aorta is calcified. The remaining cardiomediastinal contours are unremarkable. No pneumothorax. The osseous structures appear diffusely demineralized which may limit detection of small or nondisplaced fractures. No acute osseous abnormality or suspicious osseous lesion. Telemetry leads overlie the chest. Chest wall soft tissues are unremarkable. Bowel gas pattern is nonobstructive IMPRESSION: Low volumes and atelectasis. Septal thickening and vascular congestion may suggest developing interstitial edema. Trace bilateral effusions suspected as well. Cardiomegaly and Aortic Atherosclerosis (ICD10-I70.0). Electronically Signed   By: Lovena Le M.D.   On: 06/11/2021 20:21   CT Head Wo Contrast  Result Date: 06/11/2021 CLINICAL  DATA:  Status post multiple falls. EXAM: CT HEAD WITHOUT CONTRAST TECHNIQUE: Contiguous axial images were obtained from the base of the skull through the vertex without intravenous contrast. COMPARISON:  November 02, 2020 FINDINGS: Brain: There is mild cerebral atrophy with widening of the extra-axial spaces and ventricular dilatation. There are areas of decreased attenuation within the white matter tracts of the supratentorial brain, consistent with microvascular disease changes. Vascular: No hyperdense vessel or unexpected calcification. Skull: Normal. Negative for fracture or focal lesion. Sinuses/Orbits: A 1.8 cm x 1.3 cm right maxillary sinus polyp versus mucous retention cyst is seen. Other: None. IMPRESSION: 1. Generalized cerebral atrophy. 2. No acute intracranial abnormality. Electronically Signed   By: Virgina Norfolk M.D.   On: 06/11/2021 20:26   DG Hip Unilat W or Wo Pelvis 2-3 Views Right  Result Date: 06/11/2021 CLINICAL DATA:  87 year old male with fall and right hip pain. EXAM: DG HIP (WITH OR WITHOUT PELVIS) 2-3V RIGHT COMPARISON:  None. FINDINGS: There is no acute fracture or dislocation. The bones are osteopenic. Mild bilateral hip arthritic changes. There is slight cortical prominence of the lateral femoral heads which may predispose to cam type femoroacetabular impingement. The soft tissues are unremarkable. There acute therapy seeds of the prostate gland noted. IMPRESSION: 1. No acute fracture or dislocation. 2. Mild bilateral hip arthritic changes. Electronically Signed   By: Anner Crete M.D.   On: 06/11/2021 20:44    ____________________________________________   PROCEDURES  Procedure(s) performed (including Critical Care):  .1-3 Lead EKG Interpretation  Date/Time: 06/11/2021 11:42 PM Performed by: Lucrezia Starch, MD Authorized by: Lucrezia Starch, MD     Interpretation: non-specific     ECG rate assessment: tachycardic     Rhythm: atrial fibrillation      Ectopy: none     Conduction: normal   Comments:     Auto verified heart rate values not accurate as palpated pulse and rate seen on ECG more consistent with rates of 1 15-1 25.   ____________________________________________   INITIAL IMPRESSION / ASSESSMENT AND PLAN / ED COURSE      Patient presents with above-stated history exam for assessment of 2 falls that occurred earlier today at his independent living facility.  Patient describes these as mechanical with his walker getting out from under him although he states he also has had some weakness in his right leg over the last 2 to 3 weeks.  On exam he does have some weakness in his right leg.  He is tachycardic with rates between 110 and 125 on exam but otherwise stable vital signs on room air.  States he is compliant with all his medications.  He has no obvious significant traumatic findings on exam.  While it is certainly possible fall today were mechanical given weakness reported in the right leg and seen on exam differential also includes CVA, occult hip injury, peripheral nerve injury and other peripheral etiologies.  No obvious findings on exam of cellulitis no significant change in passive range of motion to his suggest septic hip.  In addition patient is able to flex and extend his feet on the right with symmetric strength compared to left as well as at the knee suggesting his weakness is primarily isolated to the right hip.  Given patient's age and comorbidities and recent falls did obtain ECG as well given his elevated heart rate.  Noted to be in the 120s on ECG but intermittently 110s to 115 on monitor.  He was given a dose of his home Coreg.  Low suspicion that he is symptomatic at this time from this arrhythmia or ischemia contributed to his fall today given he denies any chest pain or  shortness of breath and has a nonelevated troponin.  Chest x-ray shows evidence of known heart failure without overt edema.  Patient denies any  shortness of breath or chest pain and will he had a charted SPO2 one-point in the 70s on several my assessments his SPO2 in the high 90s I suspect he is having erroneous results to his A. fib.  Do not believe he is currently hypoxic at this time.  CMP shows no significant electrolyte or metabolic derangements.  CBC shows no leukocytosis or acute anemia.  UA has no evidence of urinary tract infection explain patient's weakness recurrent falls.  Magnesium is unremarkable.  Patient reports he is a walker to get around at home.  We will trial ambulation to see if patient is able to ambulate without significant change in his vital signs and to do so unassisted.  Care patient signed over to oncoming rider approximately 2300.  Plan is to follow-up trial of ambulation and reassess with low threshold for admission if patient seems unsteady.     ____________________________________________   FINAL CLINICAL IMPRESSION(S) / ED DIAGNOSES  Final diagnoses:  Recurrent pneumonia  Anticoagulated  Longstanding persistent atrial fibrillation (HCC)  Right leg weakness    Medications  carvedilol (COREG) tablet 3.125 mg (3.125 mg Oral Given 06/11/21 2333)  lisinopril (ZESTRIL) tablet 2.5 mg (2.5 mg Oral Given 06/11/21 2334)  apixaban (ELIQUIS) tablet 5 mg (has no administration in time range)     ED Discharge Orders     None        Note:  This document was prepared using Dragon voice recognition software and may include unintentional dictation errors.    Lucrezia Starch, MD 06/11/21 [phone]

## 2021-06-12 ENCOUNTER — Inpatient Hospital Stay: Admit: 2021-06-12 | Payer: Medicare PPO

## 2021-06-12 ENCOUNTER — Inpatient Hospital Stay
Admit: 2021-06-12 | Discharge: 2021-06-12 | Disposition: A | Discharge status: Home / Self Care | Payer: Medicare PPO | Attending: Internal Medicine | Admitting: Internal Medicine

## 2021-06-12 ENCOUNTER — Encounter: Payer: Self-pay | Admitting: Internal Medicine

## 2021-06-12 DIAGNOSIS — I5023 Acute on chronic systolic (congestive) heart failure: Secondary | ICD-10-CM | POA: Diagnosis not present

## 2021-06-12 DIAGNOSIS — I4821 Permanent atrial fibrillation: Secondary | ICD-10-CM | POA: Diagnosis not present

## 2021-06-12 DIAGNOSIS — E876 Hypokalemia: Secondary | ICD-10-CM | POA: Diagnosis not present

## 2021-06-12 DIAGNOSIS — Z808 Family history of malignant neoplasm of other organs or systems: Secondary | ICD-10-CM | POA: Diagnosis not present

## 2021-06-12 DIAGNOSIS — E785 Hyperlipidemia, unspecified: Secondary | ICD-10-CM | POA: Diagnosis present

## 2021-06-12 DIAGNOSIS — Z803 Family history of malignant neoplasm of breast: Secondary | ICD-10-CM | POA: Diagnosis not present

## 2021-06-12 DIAGNOSIS — I4811 Longstanding persistent atrial fibrillation: Secondary | ICD-10-CM

## 2021-06-12 DIAGNOSIS — Z7984 Long term (current) use of oral hypoglycemic drugs: Secondary | ICD-10-CM | POA: Diagnosis not present

## 2021-06-12 DIAGNOSIS — N1831 Chronic kidney disease, stage 3a: Secondary | ICD-10-CM | POA: Diagnosis present

## 2021-06-12 DIAGNOSIS — I952 Hypotension due to drugs: Secondary | ICD-10-CM | POA: Diagnosis present

## 2021-06-12 DIAGNOSIS — R0902 Hypoxemia: Secondary | ICD-10-CM | POA: Diagnosis not present

## 2021-06-12 DIAGNOSIS — R531 Weakness: Secondary | ICD-10-CM | POA: Diagnosis not present

## 2021-06-12 DIAGNOSIS — E871 Hypo-osmolality and hyponatremia: Secondary | ICD-10-CM | POA: Diagnosis present

## 2021-06-12 DIAGNOSIS — Z7901 Long term (current) use of anticoagulants: Secondary | ICD-10-CM | POA: Diagnosis not present

## 2021-06-12 DIAGNOSIS — I959 Hypotension, unspecified: Secondary | ICD-10-CM | POA: Diagnosis not present

## 2021-06-12 DIAGNOSIS — I5043 Acute on chronic combined systolic (congestive) and diastolic (congestive) heart failure: Secondary | ICD-10-CM | POA: Diagnosis present

## 2021-06-12 DIAGNOSIS — Z79899 Other long term (current) drug therapy: Secondary | ICD-10-CM | POA: Diagnosis not present

## 2021-06-12 DIAGNOSIS — R609 Edema, unspecified: Secondary | ICD-10-CM | POA: Diagnosis present

## 2021-06-12 DIAGNOSIS — I130 Hypertensive heart and chronic kidney disease with heart failure and stage 1 through stage 4 chronic kidney disease, or unspecified chronic kidney disease: Secondary | ICD-10-CM | POA: Diagnosis present

## 2021-06-12 DIAGNOSIS — Z8546 Personal history of malignant neoplasm of prostate: Secondary | ICD-10-CM | POA: Diagnosis not present

## 2021-06-12 DIAGNOSIS — I4819 Other persistent atrial fibrillation: Secondary | ICD-10-CM | POA: Diagnosis not present

## 2021-06-12 DIAGNOSIS — W19XXXA Unspecified fall, initial encounter: Secondary | ICD-10-CM

## 2021-06-12 DIAGNOSIS — E1122 Type 2 diabetes mellitus with diabetic chronic kidney disease: Secondary | ICD-10-CM | POA: Diagnosis present

## 2021-06-12 DIAGNOSIS — R0602 Shortness of breath: Secondary | ICD-10-CM | POA: Diagnosis present

## 2021-06-12 DIAGNOSIS — Z66 Do not resuscitate: Secondary | ICD-10-CM | POA: Diagnosis present

## 2021-06-12 DIAGNOSIS — T502X5A Adverse effect of carbonic-anhydrase inhibitors, benzothiadiazides and other diuretics, initial encounter: Secondary | ICD-10-CM | POA: Diagnosis present

## 2021-06-12 DIAGNOSIS — Z923 Personal history of irradiation: Secondary | ICD-10-CM | POA: Diagnosis not present

## 2021-06-12 DIAGNOSIS — E869 Volume depletion, unspecified: Secondary | ICD-10-CM | POA: Diagnosis present

## 2021-06-12 DIAGNOSIS — I9589 Other hypotension: Secondary | ICD-10-CM | POA: Diagnosis not present

## 2021-06-12 DIAGNOSIS — Z20822 Contact with and (suspected) exposure to covid-19: Secondary | ICD-10-CM | POA: Diagnosis present

## 2021-06-12 LAB — COMPREHENSIVE METABOLIC PANEL
ALT: 13 U/L (ref 0–44)
AST: 19 U/L (ref 15–41)
Albumin: 3 g/dL — ABNORMAL LOW (ref 3.5–5.0)
Alkaline Phosphatase: 77 U/L (ref 38–126)
Anion gap: 8 (ref 5–15)
BUN: 26 mg/dL — ABNORMAL HIGH (ref 8–23)
CO2: 25 mmol/L (ref 22–32)
Calcium: 8.5 mg/dL — ABNORMAL LOW (ref 8.9–10.3)
Chloride: 104 mmol/L (ref 98–111)
Creatinine, Ser: 1.05 mg/dL (ref 0.61–1.24)
GFR, Estimated: >60 mL/min (ref >60)
Glucose, Bld: 88 mg/dL (ref 70–99)
Potassium: 4.1 mmol/L (ref 3.5–5.1)
Sodium: 137 mmol/L (ref 135–145)
Total Bilirubin: 1.6 mg/dL — ABNORMAL HIGH (ref 0.3–1.2)
Total Protein: 5.9 g/dL — ABNORMAL LOW (ref 6.5–8.1)

## 2021-06-12 LAB — CBC
HCT: 39.7 % (ref 39.0–52.0)
Hemoglobin: 13.4 g/dL (ref 13.0–17.0)
MCH: 30.2 pg (ref 26.0–34.0)
MCHC: 33.8 g/dL (ref 30.0–36.0)
MCV: 89.6 fL (ref 80.0–100.0)
Platelets: 209 10*3/uL (ref 150–400)
RBC: 4.43 MIL/uL (ref 4.22–5.81)
RDW: 15.7 % — ABNORMAL HIGH (ref 11.5–15.5)
WBC: 6.2 10*3/uL (ref 4.0–10.5)
nRBC: 0 % (ref 0.0–0.2)

## 2021-06-12 LAB — RESP PANEL BY RT-PCR (FLU A&B, COVID) ARPGX2
Influenza A by PCR: NEGATIVE
Influenza B by PCR: NEGATIVE
SARS Coronavirus 2 by RT PCR: NEGATIVE

## 2021-06-12 LAB — ECHOCARDIOGRAM COMPLETE
Height: 68 in
S' Lateral: 2.19 cm
Weight: 2915.2 oz

## 2021-06-12 LAB — BRAIN NATRIURETIC PEPTIDE: B Natriuretic Peptide: 386.7 pg/mL — ABNORMAL HIGH (ref 0.0–100.0)

## 2021-06-12 LAB — MAGNESIUM: Magnesium: 2.2 mg/dL (ref 1.7–2.4)

## 2021-06-12 LAB — PROCALCITONIN: Procalcitonin: <0.1 ng/mL

## 2021-06-12 LAB — TROPONIN I (HIGH SENSITIVITY)
Troponin I (High Sensitivity): 10 ng/L (ref <18)
Troponin I (High Sensitivity): 10 ng/L (ref <18)

## 2021-06-12 LAB — PHOSPHORUS: Phosphorus: 3.3 mg/dL (ref 2.5–4.6)

## 2021-06-12 LAB — OSMOLALITY, URINE: Osmolality, Ur: 685 mOsm/kg (ref 300–900)

## 2021-06-12 LAB — OSMOLALITY: Osmolality: 287 mOsm/kg (ref 275–295)

## 2021-06-12 LAB — SODIUM, URINE, RANDOM: Sodium, Ur: 39 mmol/L

## 2021-06-12 LAB — TSH: TSH: 1.361 u[IU]/mL (ref 0.350–4.500)

## 2021-06-12 MED ORDER — SODIUM CHLORIDE 0.9 % IV BOLUS
250.0000 mL | Freq: Once | INTRAVENOUS | Status: AC
  Administered 2021-06-12: 250 mL via INTRAVENOUS

## 2021-06-12 MED ORDER — BUMETANIDE 0.25 MG/ML IJ SOLN
2.0000 mg | Freq: Two times a day (BID) | INTRAMUSCULAR | Status: DC
  Filled 2021-06-12 (×2): qty 8

## 2021-06-12 MED ORDER — CARVEDILOL 3.125 MG PO TABS
3.1250 mg | ORAL_TABLET | Freq: Two times a day (BID) | ORAL | Status: DC
  Filled 2021-06-12: qty 1

## 2021-06-12 MED ORDER — ACETAMINOPHEN 325 MG PO TABS
650.0000 mg | ORAL_TABLET | Freq: Four times a day (QID) | ORAL | Status: DC | PRN
  Administered 2021-06-12 – 2021-06-18 (×6): 650 mg via ORAL
  Filled 2021-06-12 (×7): qty 2

## 2021-06-12 MED ORDER — SODIUM CHLORIDE 0.9 % IV BOLUS
500.0000 mL | Freq: Once | INTRAVENOUS | Status: AC
  Administered 2021-06-12: 500 mL via INTRAVENOUS

## 2021-06-12 MED ORDER — LORATADINE 10 MG PO TABS
10.0000 mg | ORAL_TABLET | Freq: Every day | ORAL | Status: DC
  Administered 2021-06-12 – 2021-06-19 (×8): 10 mg via ORAL
  Filled 2021-06-12 (×8): qty 1

## 2021-06-12 MED ORDER — BUMETANIDE 0.25 MG/ML IJ SOLN
1.0000 mg | Freq: Once | INTRAMUSCULAR | Status: AC
  Administered 2021-06-12: 1 mg via INTRAVENOUS
  Filled 2021-06-12: qty 4

## 2021-06-12 MED ORDER — EMPAGLIFLOZIN 10 MG PO TABS
10.0000 mg | ORAL_TABLET | Freq: Every day | ORAL | Status: DC
  Administered 2021-06-12 – 2021-06-19 (×8): 10 mg via ORAL
  Filled 2021-06-12 (×11): qty 1

## 2021-06-12 MED ORDER — LISINOPRIL 5 MG PO TABS: 2.5000 mg | ORAL_TABLET | Freq: Every day | ORAL | Status: DC

## 2021-06-12 MED ORDER — ACETAMINOPHEN 650 MG RE SUPP: 650.0000 mg | Freq: Four times a day (QID) | RECTAL | Status: DC | PRN

## 2021-06-12 MED ORDER — SODIUM CHLORIDE 0.9 % IV BOLUS: 1000.0000 mL | Freq: Once | INTRAVENOUS | Status: DC

## 2021-06-12 MED ORDER — ADULT MULTIVITAMIN W/MINERALS CH
1.0000 | ORAL_TABLET | Freq: Every day | ORAL | Status: DC
  Administered 2021-06-12 – 2021-06-19 (×8): 1 via ORAL
  Filled 2021-06-12 (×8): qty 1

## 2021-06-12 MED ORDER — ATORVASTATIN CALCIUM 20 MG PO TABS
20.0000 mg | ORAL_TABLET | Freq: Every evening | ORAL | Status: DC
  Administered 2021-06-12 – 2021-06-14 (×3): 20 mg via ORAL
  Filled 2021-06-12 (×3): qty 1

## 2021-06-12 NOTE — H&P (Signed)
History and Physical    PLEASE NOTE THAT DRAGON DICTATION SOFTWARE WAS USED IN THE CONSTRUCTION OF THIS NOTE.   Chris Davis JJK:[id number] DOB: [birth year] DOA: 06/11/2021  PCP: Derinda Late, MD Patient coming from: home [facility])  I have personally briefly reviewed patient's old medical records in [place]  Chief Complaint: Generalized weakness  HPI: Lum Chris Davis is a 87 y.o. male with medical history significant for chronic systolic heart failure with most recent echocardiogram in July 2020 showing LVEF 40%, persistent atrial fibrillation chronically anticoagulated on Eliquis, hypertension, hyperlipidemia, who is admitted to [facility] on 06/11/2021 with acute on chronic systolic heart failure after presenting from home [facility] assisted living) to [facility] ED complaining of generalized weakness..   The following history is obtained via my discussions with the patient as well as my discussions with the patient's wife, who was present at bedside, in addition to my discussions with the EDP in addition to chart review.  The patient reports 2 days of progressive generalized weakness in the absence of any acute focal weakness, acute focal numbness, paresthesias, dysarthria, facial droop, dysphagia, nausea, vomiting, or vertigo.  As a consequence of this generalized weakness, the patient reports that he has experienced 2-3 ground-level mechanical falls while ambulating at his assisted living facility, in which she has tripped as a consequence of this weakness.  He denies any associated loss of consciousness and has not hit as head as a component of any of these falls.  As a consequence of one of the falls, he landed on his right hip, with mild intermittent nonradiating right hip discomfort as a consequence.  In spite of this intermittent right hip pain, he reports that he is still able to bear weight on his right lower extremity.  Otherwise, he denies any acute  arthralgias or myalgias.  Reports that these falls and not been associated with any recent chest pain, shortness of breath, palpitations, diaphoresis, dizziness, presyncope, or syncope.   He conveys that he typically weighs himself on a daily basis, and is noted a 2 to 3 pound weight gain over the course of the last week associated with mild increase in his chronic bilateral lower extremity edema.  Denies any associated new onset shortness of breath, orthopnea, or PND.  He also denies any associated recent cough, wheezing, hemoptysis, new lower extremity erythema, or calf tenderness.  Denies any recent subjective fever, chills, rigors.  No recent headache, neck stiffness, sore throat, abdominal pain, diarrhea, or rash.  No known recent COVID-19 exposures.  Denies any recent dysuria or gross hematuria, but feels that he has been experiencing an increase in urine output over the last few days.  He has a history of chronic venous stasis changes associated the bilateral lower extremities for which he has home health wound care changes dressings on a q. weekly basis.  He notes that he is scheduled for this weekly dressing change on 06/12/2021 and conveys his concern over missing this wound care appointment.   History is notable for chronic systolic heart failure, with most recent echocardiogram in July 2020 showing LVEF of 40%.  He follows with Dr. Nehemiah Massed of [place] [facility].  Patient confirms that his home diuretic regimen consists of torsemide 40 mg p.o. daily as well as spironolactone additionally, his heart failure regimen consists of empagliflozin, Coreg, and lisinopril.  Denies any recent modifications, including no dose adjustments to this regimen, and reports outstanding compliance with all of the above medications.  He  also has a history of persistent atrial fibrillation for which she is chronically anticoagulated on Eliquis, and likewise conveys outstanding compliance with this medication.  The patient  denies any known chronic underlying pulmonary pathology, and denies any known baseline supplemental oxygen requirements.     ED Course:  Vital signs in the ED were notable for the following: Tetramex 98.2; heart rate (in setting of persistent atrial fibrillation) in the range of 98-1 30, with sustained heart rates of 98-1 08; blood pressure 107/56 -139/88; respiratory rate 17-23, initial oxygen saturation noted to be 83% on room air, with ensuing improvement to 97-100% on 2 L nasal cannula.  Labs were notable for the following: CMP notable for the following: Sodium 133 relative to most recent prior value 136 on 12/17/2020, potassium 4.8, bicarbonate 23, BUN 30, creatinine 1.08 relative to baseline range of 1.1-1.3, with most recent prior value of 1.23 on 12/17/2020, glucose 87, and liver enzymes were evaluated the normal limits.  High-sensitivity troponin I found to be 7.  Magnesium 2.3.  CBC notable for white blood cell count 7800.  Urinalysis showed no white blood cells, no bacteria, leukocyte Estrace negative, and nitrate negative.  Differential COVID-19/influenza PCR checked in the ED today and found to be negative.  EKG shows atrial fibrillation with ventricular rate 129, and no evidence of overt T wave or ST changes.  Chest x-ray shows cardiomegaly with vascular congestion and interstitial edema, trace bilateral pleural effusions, low lung volumes and atelectasis, without overt evidence of infiltrate or pneumothorax.  CT head shows no evidence of acute intracranial process.  Plain films of the right hip/pelvis showed no evidence of acute fracture or dislocation.  While in the ED, the following were administered: Bumex 1 mg IV x1, Coreg 3.125 mg p.o. x1, lisinopril 2.5 mg p.o. x1.  Subsequently, the patient was admitted to the PCU for further evaluation management of presenting acute hypoxic respiratory distress in the setting of suspected acute on chronic systolic heart failure, including the need for  additional IV diuresis.     Review of Systems: As per HPI otherwise 10 point review of systems negative.   Past Medical History:  Diagnosis Date   Arrhythmia    atrial fibrillation   Atrial fibrillation, persistent ([place])    on eliquis   Chronic systolic heart failure ([place]) 07/31/2017   Hx of radiation therapy    Hyperlipidemia    Hypertension    Prostate cancer ([place])    Prostate cancer [facility])     Past Surgical History:  Procedure Laterality Date   CARDIOVERSION N/A 09/10/2017   Procedure: Cardioversion;  Surgeon: Corey Skains, MD;  Location: [facility] ORS;  Service: Cardiovascular;  Laterality: N/A;   CAROTID ARTERY ANGIOPLASTY     CATARACT EXTRACTION Bilateral    INSERTION PROSTATE RADIATION SEED     TONSILLECTOMY      Social History:  reports that he has never smoked. He has never used smokeless tobacco. He reports current alcohol use. He reports that he does not use drugs.   No Known Allergies  Family History  Problem Relation Age of Onset   Breast cancer Sister    Throat cancer Paternal Grandmother     Family history reviewed and not pertinent    Prior to Admission medications   Medication Sig Start Date End Date Taking? Authorizing Provider  acetaminophen (TYLENOL) 500 MG tablet Take 500 mg by mouth every 6 (six) hours as needed (for pain/headaches.).   Yes [provider]  apixaban (  ELIQUIS) 5 MG TABS tablet Take 1 tablet (5 mg total) by mouth 2 (two) times daily. 07/22/17  Yes Mody, Ulice Bold, MD  atorvastatin (LIPITOR) 20 MG tablet Take 20 mg by mouth every evening.   Yes [provider]  carvedilol (COREG) 3.125 MG tablet Take 1 tablet (3.125 mg total) by mouth 2 (two) times daily with a meal. 12/18/20  Yes Fritzi Mandes, MD  Cranberry 500 MG CAPS Take 500 mg by mouth daily.   Yes [provider]  empagliflozin (JARDIANCE) 10 MG TABS tablet Take 10 mg by mouth daily with breakfast.   Yes [provider]  lisinopril (ZESTRIL) 2.5 MG  tablet Take 1 tablet (2.5 mg total) by mouth at bedtime. 12/18/20  Yes Fritzi Mandes, MD  Multiple Vitamins-Minerals (MULTIVITAMIN WITH MINERALS) tablet Take 1 tablet by mouth daily.   Yes [provider]  spironolactone (ALDACTONE) 25 MG tablet Take 25 mg by mouth daily. 10/08/20  Yes [provider]  torsemide (DEMADEX) 20 MG tablet Take 2 tablets (40 mg total) by mouth daily. 12/18/20  Yes Fritzi Mandes, MD     Objective    Physical Exam: Vitals:   06/11/21 2334 06/12/21 0000 06/12/21 0015 06/12/21 0030  BP: 117/69 117/61  (!) 107/56  Pulse:   (!) 102 99  Resp:  18 (!) 21 16  Temp:      TempSrc:      SpO2:   100% 100%  Weight:      Height:        General: appears to be stated age; alert, oriented Skin: warm, dry, no rash Head:  AT/[place] Mouth:  Oral mucosa membranes appear moist, normal dentition Neck: supple; trachea midline Heart:  RRR; did not appreciate any M/R/G Lungs: b/l crackles noted; slightly diminished bibasilar breath sounds ; did not appreciate any wheezes or rhonchi.  Abdomen: + BS; soft, ND, NT Vascular: 2+ pedal pulses b/l; 2+ radial pulses b/l Extremities: 2+ edema in the bilateral lower extremities , no muscle wasting Neuro: strength and sensation intact in upper and lower extremities b/l     Labs on Admission: I have personally reviewed following labs and imaging studies  CBC: Recent Labs  Lab 06/11/21 2003  WBC 7.8  NEUTROABS 5.7  HGB 14.7  HCT 44.7  MCV 90.1  PLT 989   Basic Metabolic Panel: Recent Labs  Lab 06/11/21 2003  NA 133*  K 4.8  CL 104  CO2 23  GLUCOSE 87  BUN 30*  CREATININE 1.08  CALCIUM 8.8*  MG 2.3   GFR: Estimated Creatinine Clearance: 51.5 mL/min (by C-G formula based on SCr of 1.08 mg/dL). Liver Function Tests: Recent Labs  Lab 06/11/21 2003  AST 25  ALT 17  ALKPHOS 91  BILITOT 1.4*  PROT 6.8  ALBUMIN 3.7   No results for input(s): LIPASE, AMYLASE in the last 168 hours. No results for  input(s): AMMONIA in the last 168 hours. Coagulation Profile: No results for input(s): INR, PROTIME in the last 168 hours. Cardiac Enzymes: No results for input(s): CKTOTAL, CKMB, CKMBINDEX, TROPONINI in the last 168 hours. BNP (last 3 results) No results for input(s): PROBNP in the last 8760 hours. HbA1C: No results for input(s): HGBA1C in the last 72 hours. CBG: No results for input(s): GLUCAP in the last 168 hours. Lipid Profile: No results for input(s): CHOL, HDL, LDLCALC, TRIG, CHOLHDL, LDLDIRECT in the last 72 hours. Thyroid Function Tests: No results for input(s): TSH, T4TOTAL, FREET4, T3FREE, THYROIDAB in the last  72 hours. Anemia Panel: No results for input(s): VITAMINB12, FOLATE, FERRITIN, TIBC, IRON, RETICCTPCT in the last 72 hours. Urine analysis:    Component Value Date/Time   COLORURINE YELLOW (A) 06/11/2021 2003   APPEARANCEUR CLEAR (A) 06/11/2021 2003   LABSPEC 1.022 06/11/2021 2003   PHURINE 5.0 06/11/2021 2003   GLUCOSEU >=500 (A) 06/11/2021 2003   HGBUR NEGATIVE 06/11/2021 2003   BILIRUBINUR NEGATIVE 06/11/2021 2003   KETONESUR 5 (A) 06/11/2021 2003   PROTEINUR NEGATIVE 06/11/2021 2003   NITRITE NEGATIVE 06/11/2021 2003   LEUKOCYTESUR NEGATIVE 06/11/2021 2003    Radiological Exams on Admission: DG Chest 2 View  Result Date: 06/11/2021 CLINICAL DATA:  Multiple falls, weakness EXAM: CHEST - 2 VIEW COMPARISON:  Radiograph 07/21/2017 FINDINGS: Chronically coarsened interstitial and bronchitic changes. Some peripheral septal (Kerley B) line are noted in the lung periphery with pulmonary vascular congestion and cephalization. Cardiomegaly is similar to comparison prior. The aorta is calcified. The remaining cardiomediastinal contours are unremarkable. No pneumothorax. The osseous structures appear diffusely demineralized which may limit detection of small or nondisplaced fractures. No acute osseous abnormality or suspicious osseous lesion. Telemetry leads overlie the  chest. Chest wall soft tissues are unremarkable. Bowel gas pattern is nonobstructive IMPRESSION: Low volumes and atelectasis. Septal thickening and vascular congestion may suggest developing interstitial edema. Trace bilateral effusions suspected as well. Cardiomegaly and Aortic Atherosclerosis (ICD10-I70.0). Electronically Signed   By: Lovena Le M.D.   On: 06/11/2021 20:21   CT Head Wo Contrast  Result Date: 06/11/2021 CLINICAL DATA:  Status post multiple falls. EXAM: CT HEAD WITHOUT CONTRAST TECHNIQUE: Contiguous axial images were obtained from the base of the skull through the vertex without intravenous contrast. COMPARISON:  November 02, 2020 FINDINGS: Brain: There is mild cerebral atrophy with widening of the extra-axial spaces and ventricular dilatation. There are areas of decreased attenuation within the white matter tracts of the supratentorial brain, consistent with microvascular disease changes. Vascular: No hyperdense vessel or unexpected calcification. Skull: Normal. Negative for fracture or focal lesion. Sinuses/Orbits: A 1.8 cm x 1.3 cm right maxillary sinus polyp versus mucous retention cyst is seen. Other: None. IMPRESSION: 1. Generalized cerebral atrophy. 2. No acute intracranial abnormality. Electronically Signed   By: Virgina Norfolk M.D.   On: 06/11/2021 20:26   DG Hip Unilat W or Wo Pelvis 2-3 Views Right  Result Date: 06/11/2021 CLINICAL DATA:  87 year old male with fall and right hip pain. EXAM: DG HIP (WITH OR WITHOUT PELVIS) 2-3V RIGHT COMPARISON:  None. FINDINGS: There is no acute fracture or dislocation. The bones are osteopenic. Mild bilateral hip arthritic changes. There is slight cortical prominence of the lateral femoral heads which may predispose to cam type femoroacetabular impingement. The soft tissues are unremarkable. There acute therapy seeds of the prostate gland noted. IMPRESSION: 1. No acute fracture or dislocation. 2. Mild bilateral hip arthritic changes.  Electronically Signed   By: Anner Crete M.D.   On: 06/11/2021 20:44     EKG: Independently reviewed, with result as described above.    Assessment/Plan   Chris Davis is a 87 y.o. male with medical history significant for chronic systolic heart failure with most recent echocardiogram in July 2020 showing LVEF 40%, persistent atrial fibrillation chronically anticoagulated on Eliquis, hypertension, hyperlipidemia, who is admitted to [facility] on 06/11/2021 with acute on chronic systolic heart failure after presenting from home [facility] assisted living) to [facility] ED complaining of generalized weakness..    Principal Problem:   Acute on chronic  systolic (congestive) heart failure (HCC) Active Problems:   HTN (hypertension)   HLD (hyperlipidemia)   Peripheral edema   Generalized weakness   Falls   Acute hyponatremia      #) Acute on chronic systolic heart failure: dx of acute decompensation on the basis of presenting recent increase in edema of the bilateral lower extremities, recent weight gain, acute hyponatremia which is felt to be hypervolemic in nature, and chest x-ray showing evidence of interval development of pulmonary vascular congestion, interstitial edema, trace bilateral pleural effusions.  This is in the context of a known history of chronic systolic heart failure, with most recent echocardiogram performed in July 2020, demonstrating LVEF 40%.  Additionally, in the context of no known baseline supplemental oxygen requirements, initial oxygen saturation noted to be 83% on room air, with ensuing improvement into the range of 97 to 100% on 2 L nasal cannula, thereby meeting definition for acute hypoxic respiratory distress as opposed to failure.   Etiology leading to presenting acutely decompensated heart failure is not entirely clear at this time. Patient conveys good compliance with home diuretic therapy, which consists of torsemide 40 mg p.o. daily as  well as spironolactone, as well as good compliance with their additional home cardiac medications, which include Coreg, lisinopril, and empagliflozin. Overall, ACS leading to presenting acutely decompensated heart failure appears less likely at this time in the absence of any recent CP, presenting EKG showing no evidence of acute ischemic changes, as well as non-elevated troponin.   Of note, patient received Bumex 1 mg IV x1 while in the ED today, which, in the context of his home torsemide 40 mg p.o. daily, represents an extra half dose relative to his baseline diuretic regimen. Presentation warrants additional IV diuresis, as further detailed below, with close monitoring of ensuing renal function, electrolytes, and volume status, as further noted below. Will order Bumex 2 mg IV BID, which, over the course of 24 hours, will be the equivalent of twice his daily dose of torsemide 40 mg p.o. daily as an outpatient.  Will also closely monitor ensuing HR as an additional means to evaluate volume status and help guide subsequent diuresis decision-making. As patient is already on a BB at home, will plan to continue this. Of note, I utilized the Heart Failure order set to assist with my placement of orders on this patient.   In terms of other potential contributions leading to his acute hypoxic respiratory distress, clinically and radiographically, presentation is less suggestive of pneumonia, but will check procalcitonin level to further evaluate.  COVID-19/influenza PCR were checked in the ED today and found to be negative.  Chest x-ray shows no evidence of pneumothorax, but does show evidence of low lung volumes and atelectasis, potentially representing a contribution from suboptimal inspiratory effort.  As a consequence, we will promote incentive spirometry.  Acute pulmonary embolism appears clinically less likely, not only on the basis of the nature of his presentation, but also in the context of the patient's  good compliance with his chronic anticoagulation on Eliquis.    Plan: monitor strict I's & O's and daily weights. Monitor on telemetry, including trend in HR in response to diuresis, as above. Monitor continuous pulse oximetry. Repeat BMP in the morning, including for monitoring trend of potassium, bicarbonate, and renal function in response to interval diuresis efforts.  repeat serum magnesium level in the AM. Close monitoring of ensuing blood pressure response to diuresis efforts, including to help guide need for improvement in afterload  reduction in order to optimize cardiac output.  Bumex 2 mg IV twice daily, as further described above.  Hold home torsemide and spironolactone for now.  Continue home Coreg and lisinopril for afterload reduction to promote optimization of cardiac output.  Echocardiogram has been ordered for the morning, with readership assigned to [facility] cardiology given that the patient follows with Dr. Nehemiah Massed as an outpatient.  Continue to trend serial troponin.  Check procalcitonin.  Add on BNP.  Incentive spirometry.  Check serum phosphorus level.  Wound care consult placed as the patient will miss his weekly wound care appointment while in the hospital, as further detailed above.        #) Generalized weakness: 2 days of generalized weakness in the absence of any acute focal neurologic deficits to suggest acute ischemic CVA.  Additionally, noncontrast CT of the head shows no evidence of acute intracranial process, including no evidence of intracranial intervention or acute ischemic infarct.  Suspect that this is as a consequence of physiologic stress stemming from presenting acute on chronic systolic heart failure.  No evidence of underlying contributory infectious process at this time, including negative COVID-19 test result as well as no evidence of UTI.  Additionally, chest x-ray shows no evidence to suggest pneumonia at this time.  Plan: Further evaluation management of  presenting acute on chronic systolic heart failure, as above.  Physical therapy consult has been placed.  Check TSH and MMA.  Fall precautions.      #) Ground level mechanical falls: Presentation consistent with 2-3 ground-level mechanical falls over the last few days, in which the patient reports having tripped while ambulating at home as a consequence of his aforementioned generalized weakness over that timeframe, without any preceding or resultant loss of consciousness.  Not associate with any presyncope, syncope, or chest pain.  Did not hit his head as a component of this fall,and CT head shows no evidence of acute intracranial process, including no evidence of intracranial hemorrhage.  Upon review as well as, he did fall onto his right hip, but with minimal residual right hip discomfort at time of presentation, and ensuing plain films demonstrating no evidence of acute fracture or dislocation.  Presentation not associate with any acute neurologic deficits.  No evidence of contributory underlying infectious process, including no evidence of UTI, as further detailed above.  Of note, he is on Eliquis chronically in the setting of persistent atrial fibrillation.   Plan: Further evaluation and treatment generalized weakness, as above.  Fall precautions ordered.  Physical therapy consult placed.      #) Acute hypoosmolar hyponatremia: Presenting serum sodium found to be 133 relative to most recent prior value 136 in January 2022.  In the context of presenting acute on chronic systolic heart failure, suspect that this is hypervolemic in nature.   will pursue IV diuresis, as further detailed above, and closely monitor ensuing renal function and trend in serum sodium.   Plan: Add on random urine sodium as well as urine osmolality.  Also check serum osmolality to confirm suspected hypoosmolar etiology.  Particularly in the setting of presenting generalized weakness, will also check TSH.  Monitor strict  I's and O's and daily weights.  IV diuresis, as above.  Repeat BMP in the morning.       #) Persistent atrial fibrillation: Documented history of such. In the setting of a CHA2DS2-VASc score of 4, there is an indication for the patient to be on chronic anticoagulation for thromboembolic prophylaxis. Consistent with this,  the patient is chronically anticoagulated on Eliquis. Home AV nodal blocking regimen: Coreg.  Most recent echocardiogram was performed in July 2020, with plan to obtain updated echocardiogram in the morning in the setting of presenting acute on chronic systolic heart failure, as above. Presenting EKG suggestive of atrial fibrillation without overt evidence of acute ischemic changes.  While in the ED, his heart rate has occasionally jumped up into the 120s, but with sustained heart rates consistently in the 90s to low 100s, with anticipation of further rate control as a result of plan for additional IV diuresis in setting of acute on chronic systolic heart failure.  Consequently, there does not appear to be any indication at this time for initiation of diltiazem drip.  We will watch ensuing heart rate closely, particular in response to subsequent IV diuresis.    Plan: monitor strict I's & O's and daily weights.  IV diuresis, as above repeat BMP and CBC in the morning. Check serum magnesium level. Continue home AV nodal blocking regimen, as above.  Continue home Eliquis.  Monitor on telemetry.       #) Hyperlipidemia: On atorvastatin as an outpatient.  Plan: Continue home statin.        #) Chronic urticaria: The patient reports a history of such, for which he is on daily Claritin as ordered by his PCP.  Plan: Continue home Claritin, will monitoring for any evidence of anticholinergic side effects, including tachycardia, may be associated with this medication.        DVT prophylaxis: Eliquis Code Status: Per my discussions with the patient as well as his wife this  evening, the patient clearly conveys his wish to be DNR/DNI Family Communication: Case was discussed with the patient's wife, who is present at bedside Disposition Plan: Per Rounding Team Consults called: none  Admission status: Inpatient; PCU     Of note, this patient was added by me to the following Admit List/Treatment Team: [facility].      PLEASE NOTE THAT DRAGON DICTATION SOFTWARE WAS USED IN THE CONSTRUCTION OF THIS NOTE.   [facility] Pager [phone] From [place]  Otherwise, please contact night-coverage  [email or web address] Password [email or web address]   06/12/2021, 1:40 AM

## 2021-06-12 NOTE — Evaluation (Signed)
Physical Therapy Evaluation Patient Details Name: Chris Davis MRN: [id number] DOB: 1932/02/20 Today's Date: 06/12/2021   History of Present Illness  87 y.o. male with medical history significant for chronic systolic heart failure with most recent echocardiogram in July 2020 showing LVEF 40%, persistent atrial fibrillation chronically anticoagulated on Eliquis, hypertension, hyperlipidemia, who is admitted to [facility] on 06/11/2021 with acute on chronic systolic heart failure after presenting from home [facility] independent living) complaining of generalized weakness.  Clinical Impression  Pt showed great effort and willingness to participate.  He appeared to have reasonable O2 and HR vitals t/o the effort, BP was low (80s/50s) but he was asymptomatic, nursing aware of BP issues and had given a bolus prior to PT session.  Pt with generalized weakness, R>L, and needed assist to get up to sitting EOB but did relatively well with transfers and limited in-room ambulation.  He displays very stooped/kyphotic posture and needed cues to insure safe manipulation of the walker.  Pt reports that typically he is able to go out to eat and do limited community ambulation, he is not at this functional level now and will benefit from STR at discharge.     Follow Up Recommendations SNF    Equipment Recommendations  None recommended by PT    Recommendations for Other Services       Precautions / Restrictions Precautions Precautions: Fall Restrictions Weight Bearing Restrictions: No      Mobility  Bed Mobility Overal bed mobility: Needs Assistance Bed Mobility: Supine to Sit     Supine to sit: Min assist;Mod assist          Transfers Overall transfer level: Needs assistance Equipment used: Rolling walker (2 wheeled) Transfers: Sit to/from Stand Sit to Stand: Min assist         General transfer comment: Pt struggled to attain standing from low bed and chair heights.   Cues for UE use with inconsistent awareness (especially with controlling return to sitting).  Ambulation/Gait Ambulation/Gait assistance: Min guard Gait Distance (Feet): 15 Feet Assistive device: Rolling walker (2 wheeled)       General Gait Details: Pt with serverly stooped/kyphotic posture, leaning forward on the walker.  Reports he has some issues with walker getting too far in front of him - educated and instructed on appropriate AD use  Stairs            Wheelchair Mobility    Modified Rankin (Stroke Patients Only)       Balance Overall balance assessment: Needs assistance   Sitting balance-Leahy Scale: Good Sitting balance - Comments: stooped/kyphotic posture w/o balance issues   Standing balance support: Bilateral upper extremity supported Standing balance-Leahy Scale: Fair Standing balance comment: leaning heavily forward on walker, no LOBs                             Pertinent Vitals/Pain Pain Assessment:  (reports chronic general pain)    Home Living Family/patient expects to be discharged to:: Private residence Living Arrangements: Spouse/significant other Available Help at Discharge: Family;Available 24 hours/day Type of Home: Independent living facility Home Access: Level entry (small threshold)     Home Layout: One level Home Equipment: Walker - 2 wheels;Walker - 4 wheels;Shower seat;Grab bars - tub/shower      Prior Function Level of Independence: Independent with assistive device(s)         Comments: h/o multiple falls, had fall leading to this admission  Hand Dominance        Extremity/Trunk Assessment   Upper Extremity Assessment Upper Extremity Assessment: Generalized weakness (limited shoulder elevation bilaterally)    Lower Extremity Assessment Lower Extremity Assessment: Generalized weakness (R LE grossly 3+/5, L LE grossly 4-/5)       Communication   Communication: No difficulties  Cognition  Arousal/Alertness: Awake/alert Behavior During Therapy: WFL for tasks assessed/performed Overall Cognitive Status: Within Functional Limits for tasks assessed                                        General Comments      Exercises     Assessment/Plan    PT Assessment Patient needs continued PT services  PT Problem List Decreased strength;Decreased range of motion;Decreased activity tolerance;Decreased balance;Decreased mobility;Decreased coordination;Decreased knowledge of use of DME;Decreased safety awareness       PT Treatment Interventions DME instruction;Gait training;Stair training;Functional mobility training;Therapeutic activities;Therapeutic exercise;Balance training;Cognitive remediation    PT Goals (Current goals can be found in the Care Plan section)  Acute Rehab PT Goals Patient Stated Goal: get stronger PT Goal Formulation: With patient Time For Goal Achievement: 06/26/21 Potential to Achieve Goals: Fair    Frequency Min 2X/week   Barriers to discharge        Co-evaluation               AM-PAC PT "6 Clicks" Mobility  Outcome Measure Help needed turning from your back to your side while in a flat bed without using bedrails?: A Little Help needed moving from lying on your back to sitting on the side of a flat bed without using bedrails?: A Lot Help needed moving to and from a bed to a chair (including a wheelchair)?: A Little Help needed standing up from a chair using your arms (e.g., wheelchair or bedside chair)?: A Lot Help needed to walk in hospital room?: A Little Help needed climbing 3-5 steps with a railing? : Total 6 Click Score: 14    End of Session Equipment Utilized During Treatment: Gait belt Activity Tolerance: Patient limited by fatigue;Patient tolerated treatment well Patient left: with chair alarm set;with call bell/phone within reach;with family/visitor present   PT Visit Diagnosis: Muscle weakness (generalized)  (M62.81);Difficulty in walking, not elsewhere classified (R26.2);Unsteadiness on feet (R26.81)    Time: 3832-9191 PT Time Calculation (min) (ACUTE ONLY): 37 min   Charges:   PT Evaluation $PT Eval Low Complexity: 1 Low PT Treatments $Gait Training: 8-22 mins $Therapeutic Activity: 8-22 mins        Kreg Shropshire, DPT 06/12/2021, 1:26 PM

## 2021-06-12 NOTE — ED Notes (Signed)
Pt resting quietly with his eyes closed, wife at bedside

## 2021-06-12 NOTE — Progress Notes (Signed)
PROGRESS NOTE    Chris Davis  STM:[id number] DOB: July 21, 1932 DOA: 06/11/2021 PCP: Derinda Late, MD   Chief complaint.  Generalized weakness. Brief Narrative:  Chris Davis is a 87 y.o. male with medical history significant for chronic systolic heart failure with most recent echocardiogram in July 2020 showing LVEF 40%, persistent atrial fibrillation chronically anticoagulated on Eliquis, hypertension, hyperlipidemia, who is admitted to [facility] on 06/11/2021 with acute on chronic systolic heart failure after presenting from home [facility] assisted living) to [facility] ED complaining of generalized weakness..  Was diagnosed with acute on chronic systolic congestive heart failure, he was giving IV diuretics, he developed significant hypotension.  Assessment & Plan:   Principal Problem:   Acute on chronic systolic (congestive) heart failure (HCC) Active Problems:   HTN (hypertension)   HLD (hyperlipidemia)   Peripheral edema   Generalized weakness   Falls   Acute hyponatremia  #1.  Hypotension. Appear to be multifactorial secondary to medications, including beta-blocker, ACE inhibitor and diuretics.  Patient was dizzy and lightheaded.  He received up to 50 mL bolus earlier this morning.  I will give another 500 mm bolus.  Due to significant hypotension, I will hold off diuretics, hold off ACE inhibitor and beta-blocker.  Will restart tomorrow if blood pressure is more stable.  2.  Acute on chronic systolic congestive heart failure. Currently, patient does not seem to be volume overloaded.  I will hold off all diuretics for now.  3.  Persistent atrial fibrillation. Continue Eliquis.  4.  Hyponatremia. Condition improved.  5.  Generalized weakness. PT/OT.    DVT prophylaxis: Eliquis Code Status: DNR Family Communication: Wife updated at bedside. Disposition Plan:    Status is: Inpatient  Remains inpatient appropriate because:Inpatient level of care  appropriate due to severity of illness  Dispo: The patient is from: Home              Anticipated d/c is to: Home              Patient currently is not medically stable to d/c.   Difficult to place patient No        I/O last 3 completed shifts: In: 499.8 [IV Piggyback:499.8] Out: -  No intake/output data recorded.     Consultants:    Procedures: None  Antimicrobials: None   Subjective: Patient has some dizziness and lightheadedness.  He does not feel short of breath.  He slept well last night without any paroxysmal active dyspnea. No cough. No fever chills No dysuria hematuria No chest pain or palpitation.  Objective: Vitals:   06/12/21 0903 06/12/21 0945 06/12/21 1115 06/12/21 1200  BP: 93/69 (!) 95/58 (!) 73/48 (!) 93/57  Pulse:  87 73   Resp:  18 18   Temp:  97.7 F (36.5 C) 97.8 F (36.6 C)   TempSrc:  Oral Oral   SpO2:  98% 97%   Weight:      Height:        Intake/Output Summary (Last 24 hours) at 06/12/2021 1248 Last data filed at 06/12/2021 1115 Gross per 24 hour  Intake 499.75 ml  Output 0 ml  Net 499.75 ml   Filed Weights   06/11/21 1945  Weight: 82.6 kg    Examination:  General exam: Appears calm and comfortable  Respiratory system: Clear to auscultation. Respiratory effort normal. Cardiovascular system: Irregular. No JVD, murmurs, rubs, gallops or clicks.  Gastrointestinal system: Abdomen is nondistended, soft and nontender. No organomegaly or masses felt.  Normal bowel sounds heard. Central nervous system: Alert and oriented. No focal neurological deficits. Extremities: 2-3+ edema Skin: No rashes, lesions or ulcers Psychiatry: Judgement and insight appear normal. Mood & affect appropriate.     Data Reviewed: I have personally reviewed following labs and imaging studies  CBC: Recent Labs  Lab 06/11/21 2003 06/12/21 0657  WBC 7.8 6.2  NEUTROABS 5.7  --   HGB 14.7 13.4  HCT 44.7 39.7  MCV 90.1 89.6  PLT 238 676   Basic  Metabolic Panel: Recent Labs  Lab 06/11/21 2003 06/12/21 0657  NA 133* 137  K 4.8 4.1  CL 104 104  CO2 23 25  GLUCOSE 87 88  BUN 30* 26*  CREATININE 1.08 1.05  CALCIUM 8.8* 8.5*  MG 2.3 2.2  PHOS  --  3.3   GFR: Estimated Creatinine Clearance: 52.9 mL/min (by C-G formula based on SCr of 1.05 mg/dL). Liver Function Tests: Recent Labs  Lab 06/11/21 2003 06/12/21 0657  AST 25 19  ALT 17 13  ALKPHOS 91 77  BILITOT 1.4* 1.6*  PROT 6.8 5.9*  ALBUMIN 3.7 3.0*   No results for input(s): LIPASE, AMYLASE in the last 168 hours. No results for input(s): AMMONIA in the last 168 hours. Coagulation Profile: No results for input(s): INR, PROTIME in the last 168 hours. Cardiac Enzymes: No results for input(s): CKTOTAL, CKMB, CKMBINDEX, TROPONINI in the last 168 hours. BNP (last 3 results) No results for input(s): PROBNP in the last 8760 hours. HbA1C: No results for input(s): HGBA1C in the last 72 hours. CBG: No results for input(s): GLUCAP in the last 168 hours. Lipid Profile: No results for input(s): CHOL, HDL, LDLCALC, TRIG, CHOLHDL, LDLDIRECT in the last 72 hours. Thyroid Function Tests: Recent Labs    06/12/21 0657  TSH 1.361   Anemia Panel: No results for input(s): VITAMINB12, FOLATE, FERRITIN, TIBC, IRON, RETICCTPCT in the last 72 hours. Sepsis Labs: No results for input(s): PROCALCITON, LATICACIDVEN in the last 168 hours.  Recent Results (from the past 240 hour(s))  Resp Panel by RT-PCR (Flu A&B, Covid) Nasopharyngeal Swab     Status: None   Collection Time: 06/11/21  8:03 PM   Specimen: Nasopharyngeal Swab; Nasopharyngeal(NP) swabs in vial transport medium  Result Value Ref Range Status   SARS Coronavirus 2 by RT PCR NEGATIVE NEGATIVE Final    Comment: (NOTE) SARS-CoV-2 target nucleic acids are NOT DETECTED.  The SARS-CoV-2 RNA is generally detectable in upper respiratory specimens during the acute phase of infection. The lowest concentration of SARS-CoV-2  viral copies this assay can detect is 138 copies/mL. A negative result does not preclude SARS-Cov-2 infection and should not be used as the sole basis for treatment or other patient management decisions. A negative result may occur with  improper specimen collection/handling, submission of specimen other than nasopharyngeal swab, presence of viral mutation(s) within the areas targeted by this assay, and inadequate number of viral copies(<138 copies/mL). A negative result must be combined with clinical observations, patient history, and epidemiological information. The expected result is Negative.  Fact Sheet for Patients:  EntrepreneurPulse.com.au  Fact Sheet for Healthcare Providers:  IncredibleEmployment.be  This test is no t yet approved or cleared by the [place] FDA and  has been authorized for detection and/or diagnosis of SARS-CoV-2 by FDA under an Emergency Use Authorization (EUA). This EUA will remain  in effect (meaning this test can be used) for the duration of the COVID-19 declaration under Section 564(b)(1) of the Act, 21 U.S.C.section  360bbb-3(b)(1), unless the authorization is terminated  or revoked sooner.       Influenza A by PCR NEGATIVE NEGATIVE Final   Influenza B by PCR NEGATIVE NEGATIVE Final    Comment: (NOTE) The Xpert Xpress SARS-CoV-2/FLU/RSV plus assay is intended as an aid in the diagnosis of influenza from Nasopharyngeal swab specimens and should not be used as a sole basis for treatment. Nasal washings and aspirates are unacceptable for Xpert Xpress SARS-CoV-2/FLU/RSV testing.  Fact Sheet for Patients: EntrepreneurPulse.com.au  Fact Sheet for Healthcare Providers: IncredibleEmployment.be  This test is not yet approved or cleared by the [place] FDA and has been authorized for detection and/or diagnosis of SARS-CoV-2 by FDA under an Emergency Use Authorization  (EUA). This EUA will remain in effect (meaning this test can be used) for the duration of the COVID-19 declaration under Section 564(b)(1) of the Act, 21 U.S.C. section 360bbb-3(b)(1), unless the authorization is terminated or revoked.  Performed at [facility], [address] [place],  [address]          Radiology Studies: DG Chest 2 View  Result Date: 06/11/2021 CLINICAL DATA:  Multiple falls, weakness EXAM: CHEST - 2 VIEW COMPARISON:  Radiograph 07/21/2017 FINDINGS: Chronically coarsened interstitial and bronchitic changes. Some peripheral septal (Kerley B) line are noted in the lung periphery with pulmonary vascular congestion and cephalization. Cardiomegaly is similar to comparison prior. The aorta is calcified. The remaining cardiomediastinal contours are unremarkable. No pneumothorax. The osseous structures appear diffusely demineralized which may limit detection of small or nondisplaced fractures. No acute osseous abnormality or suspicious osseous lesion. Telemetry leads overlie the chest. Chest wall soft tissues are unremarkable. Bowel gas pattern is nonobstructive IMPRESSION: Low volumes and atelectasis. Septal thickening and vascular congestion may suggest developing interstitial edema. Trace bilateral effusions suspected as well. Cardiomegaly and Aortic Atherosclerosis (ICD10-I70.0). Electronically Signed   By: Lovena Le M.D.   On: 06/11/2021 20:21   CT Head Wo Contrast  Result Date: 06/11/2021 CLINICAL DATA:  Status post multiple falls. EXAM: CT HEAD WITHOUT CONTRAST TECHNIQUE: Contiguous axial images were obtained from the base of the skull through the vertex without intravenous contrast. COMPARISON:  November 02, 2020 FINDINGS: Brain: There is mild cerebral atrophy with widening of the extra-axial spaces and ventricular dilatation. There are areas of decreased attenuation within the white matter tracts of the supratentorial brain, consistent with  microvascular disease changes. Vascular: No hyperdense vessel or unexpected calcification. Skull: Normal. Negative for fracture or focal lesion. Sinuses/Orbits: A 1.8 cm x 1.3 cm right maxillary sinus polyp versus mucous retention cyst is seen. Other: None. IMPRESSION: 1. Generalized cerebral atrophy. 2. No acute intracranial abnormality. Electronically Signed   By: Virgina Norfolk M.D.   On: 06/11/2021 20:26   DG Hip Unilat W or Wo Pelvis 2-3 Views Right  Result Date: 06/11/2021 CLINICAL DATA:  87 year old male with fall and right hip pain. EXAM: DG HIP (WITH OR WITHOUT PELVIS) 2-3V RIGHT COMPARISON:  None. FINDINGS: There is no acute fracture or dislocation. The bones are osteopenic. Mild bilateral hip arthritic changes. There is slight cortical prominence of the lateral femoral heads which may predispose to cam type femoroacetabular impingement. The soft tissues are unremarkable. There acute therapy seeds of the prostate gland noted. IMPRESSION: 1. No acute fracture or dislocation. 2. Mild bilateral hip arthritic changes. Electronically Signed   By: Anner Crete M.D.   On: 06/11/2021 20:44        Scheduled Meds:  apixaban  5 mg Oral  BID   atorvastatin  20 mg Oral QPM   empagliflozin  10 mg Oral Q breakfast   loratadine  10 mg Oral Daily   multivitamin with minerals  1 tablet Oral Daily   Continuous Infusions:   LOS: 0 days    Time spent: No charge    Sharen Hones, MD [facility]   To contact the attending provider between 7A-7P or the covering provider during after hours 7P-7A, please log into the web site [email or web address] and access using universal [place] password for that web site. If you do not have the password, please call the hospital operator.  06/12/2021, 12:48 PM

## 2021-06-12 NOTE — Progress Notes (Signed)
   06/12/21 1325  Clinical Encounter Type  Visited With Patient and family together  Visit Type Initial;Spiritual support;Social support  Referral From Other (Comment) (rounding)  Recommendations Follow-up; prayer welcomed  [place] met briefly with PT and spouse. They were still getting settled into the room after a long night in the ED. 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 They have a faith community in [place]. Will recommend follow-up care and particularly to offer prayer and continued support later on.

## 2021-06-12 NOTE — ED Notes (Signed)
Justin Howerter DO at bedside

## 2021-06-12 NOTE — ED Notes (Signed)
Pt and spouse provided with warm blankets and updated on POC.

## 2021-06-12 NOTE — NC FL2 (Signed)
[place] LEVEL OF CARE SCREENING TOOL     IDENTIFICATION  Patient Name: Chris Davis Birthdate: 1932/05/23 Sex: male Admission Date (Current Location): 06/11/2021  [facility] and [place] Number:  Engineering geologist and Address:  [facility], [address], [place], [place] [address]      Provider Number: [id number]  Attending Physician Name and Address:  Sharen Hones, MD  Relative Name and Phone Number:  Benjamine Mola (spouse) [phone]    Current Level of Care: Hospital Recommended Level of Care: [place] Prior Approval Number:    Date Approved/Denied:   PASRR Number: [id number]  Discharge Plan: SNF    Current Diagnoses: Patient Active Problem List   Diagnosis Date Noted   Acute on chronic systolic (congestive) heart failure ([place]) 06/12/2021   Peripheral edema 06/12/2021   Generalized weakness 06/12/2021   Falls 06/12/2021   Acute hyponatremia 06/12/2021   Lower limb ulcer, calf ([place]) 01/16/2021   Hypotension 12/16/2020   HLD (hyperlipidemia) 12/16/2020   Acute renal failure superimposed on stage 3a chronic kidney disease ([place]) 12/16/2020   Cellulitis of lower extremity 12/16/2020   Dizziness 11/28/2020   Stage 3b chronic kidney disease ([place]) 09/14/2020   Senile purpura ([place]) 03/09/2019   Bilateral leg edema 42/87/6811   Chronic systolic heart failure ([place]) 07/31/2017   Atrial fibrillation ([place]) 57/26/2035   Acute systolic CHF (congestive heart failure) ([place]) 07/21/2017   HTN (hypertension) 07/21/2017   Cancer of prostate ([place]) 08/27/2016    Orientation RESPIRATION BLADDER Height & Weight     Self, Time, Situation, Place  Normal Incontinent, External catheter Weight: 182 lb 3.2 oz (82.6 kg) Height:  5\' 8"  (172.7 cm)  BEHAVIORAL SYMPTOMS/MOOD NEUROLOGICAL BOWEL NUTRITION STATUS      Continent Diet (see discharge summary)  AMBULATORY STATUS COMMUNICATION OF NEEDS Skin   Limited Assist Verbally                          Personal Care Assistance Level of Assistance  Bathing, Feeding, Dressing, Total care Bathing Assistance: Limited assistance Feeding assistance: Independent Dressing Assistance: Limited assistance Total Care Assistance: Limited assistance   Functional Limitations Info  Sight, Speech, Hearing Sight Info: Adequate Hearing Info: Adequate Speech Info: Adequate    SPECIAL CARE FACTORS FREQUENCY  PT (By licensed PT), OT (By licensed OT)     PT Frequency: min 4x weekly OT Frequency: min 4x weekly            Contractures Contractures Info: Not present    Additional Factors Info  Code Status, Allergies Code Status Info: DNR Allergies Info: No [place] Allergies           Current Medications (06/12/2021):  This is the current hospital active medication list Current Facility-Administered Medications  Medication Dose Route Frequency Provider Last Rate Last Admin   acetaminophen (TYLENOL) tablet 650 mg  650 mg Oral Q6H PRN Howerter, Justin B, DO       Or   acetaminophen (TYLENOL) suppository 650 mg  650 mg Rectal Q6H PRN Howerter, Justin B, DO       apixaban (ELIQUIS) tablet 5 mg  5 mg Oral BID Howerter, Justin B, DO   5 mg at 06/12/21 1027   atorvastatin (LIPITOR) tablet 20 mg  20 mg Oral QPM Howerter, Justin B, DO       empagliflozin (JARDIANCE) tablet 10 mg  10 mg Oral Q breakfast Howerter, Justin B, DO   10 mg at  06/12/21 1241   loratadine (CLARITIN) tablet 10 mg  10 mg Oral Daily Howerter, Justin B, DO   10 mg at 06/12/21 1027   multivitamin with minerals tablet 1 tablet  1 tablet Oral Daily Howerter, Justin B, DO   1 tablet at 06/12/21 1027     Discharge Medications: Please see discharge summary for a list of discharge medications.  Relevant Imaging Results:  Relevant Lab Results:   Additional Information ZOX:[id number]  Alberteen Sam, LCSW

## 2021-06-12 NOTE — Evaluation (Signed)
Occupational Therapy Evaluation Patient Details Name: Chris Davis MRN: [id number] DOB: 10-17-1932 Today's Date: 06/12/2021    History of Present Illness 87 y.o. male with medical history significant for chronic systolic heart failure with most recent echocardiogram in July 2020 showing LVEF 40%, persistent atrial fibrillation chronically anticoagulated on Eliquis, hypertension, hyperlipidemia, who is admitted to [facility] on 06/11/2021 with acute on chronic systolic heart failure after presenting from home [facility] independent living) complaining of generalized weakness.   Clinical Impression   Chris Davis was seen for OT evaluation this date. Prior to hospital admission, pt was MOD I for mobility and ADLs. Pt lives with wife at [place]. Pt presents to acute OT demonstrating impaired ADL performance and functional mobility 2/2 decreased safety awareness, functional strength/ROM/balance deficits, and decreased activity tolerance. Pt currently requires MAX A for LBD seated EOC. MIN A + RW for BSC t/f and perihygiene in standing, assist for standing balance. SETUP tooth brushing seated in chair, unable to complete standing groomin gtasks as pt requires 1-2 UE support for functional reach inside/outside BOS. Pt completed standing therex as described below. Pt would benefit from skilled OT to address noted impairments and functional limitations (see below for any additional details) in order to maximize safety and independence while minimizing falls risk and caregiver burden. Upon hospital discharge, recommend STR to maximize pt safety and return to PLOF.     Follow Up Recommendations  SNF    Equipment Recommendations       Recommendations for Other Services       Precautions / Restrictions Precautions Precautions: Fall Restrictions Weight Bearing Restrictions: No      Mobility Bed Mobility Overal bed mobility: Needs Assistance Bed Mobility: Sit to Supine      Supine to sit: Min assist;Mod assist Sit to supine: Mod assist   General bed mobility comments: assist for BLE    Transfers Overall transfer level: Needs assistance Equipment used: Rolling walker (2 wheeled) Transfers: Sit to/from [facility] Sit to Stand: Min assist Stand pivot transfers: Min assist       General transfer comment: assist for eccentric control and RW mgmt    Balance Overall balance assessment: Needs assistance   Sitting balance-Leahy Scale: Good Sitting balance - Comments: stooped/kyphotic posture w/o balance issues   Standing balance support: Single extremity supported;During functional activity Standing balance-Leahy Scale: Poor Standing balance comment: MIN A reaching outside BOS                           ADL either performed or assessed with clinical judgement   ADL Overall ADL's : Needs assistance/impaired                                       General ADL Comments: MAX A for LBD seated EOC. MIN A + RW for BSC t/f and perihygiene in standing, assist for standing balance.      Pertinent Vitals/Pain Pain Assessment: No/denies pain     Hand Dominance Right   Extremity/Trunk Assessment Upper Extremity Assessment Upper Extremity Assessment: Generalized weakness   Lower Extremity Assessment Lower Extremity Assessment: Generalized weakness       Communication Communication Communication: No difficulties   Cognition Arousal/Alertness: Awake/alert Behavior During Therapy: WFL for tasks assessed/performed Overall Cognitive Status: Within Functional Limits for tasks assessed  General Comments  Seated following standing: BP 99/61, MAP 72, HR 99, SpO2 97% on RA    Exercises Exercises: Other exercises Other Exercises Other Exercises: Pt edcuated re: OT role, DME recs, d/c recs, falls prevention, ECS, HEP Other Exercises: LBD, toileting, tooth  brushing, sit>sup, sit<>stand x3, SPT x3, sitting/standing balance/tolerance, ~3 ft x2 mobility Other Exercises: Standing therex 1 set x 5 reps each: marches, horizontal abduction, elbow flexion/extension, supination/pronation   Shoulder Instructions      Home Living Family/patient expects to be discharged to:: Private residence Living Arrangements: Spouse/significant other Available Help at Discharge: Family;Available 24 hours/day Type of Home: Independent living facility Home Access: Level entry     Home Layout: One level     Bathroom Shower/Tub: Occupational psychologist: Standard     Home Equipment: Environmental consultant - 2 wheels;Walker - 4 wheels;Shower seat;Grab bars - tub/shower          Prior Functioning/Environment Level of Independence: Independent with assistive device(s)        Comments: h/o multiple falls, had fall leading to this admission        OT Problem List: Decreased activity tolerance;Impaired balance (sitting and/or standing);Decreased strength;Decreased range of motion;Decreased safety awareness      OT Treatment/Interventions: Therapeutic exercise;Self-care/ADL training;Energy conservation;DME and/or AE instruction;Therapeutic activities;Patient/family education;Balance training    OT Goals(Current goals can be found in the care plan section) Acute Rehab OT Goals Patient Stated Goal: get stronger OT Goal Formulation: With patient Time For Goal Achievement: 06/26/21 Potential to Achieve Goals: Good ADL Goals Pt Will Perform Grooming: with min guard assist;standing (c LRAD PRN) Pt Will Transfer to Toilet: with modified independence;bedside commode;ambulating (c LRAD PRN) Pt Will Perform Toileting - Clothing Manipulation and hygiene: with modified independence;sitting/lateral leans  OT Frequency: Min 1X/week    AM-PAC OT "6 Clicks" Daily Activity     Outcome Measure Help from another person eating meals?: None Help from another person taking care  of personal grooming?: A Lot Help from another person toileting, which includes using toliet, bedpan, or urinal?: A Little Help from another person bathing (including washing, rinsing, drying)?: A Lot Help from another person to put on and taking off regular upper body clothing?: A Little Help from another person to put on and taking off regular lower body clothing?: A Lot 6 Click Score: 16   End of Session Equipment Utilized During Treatment: Rolling walker  Activity Tolerance: Patient tolerated treatment well Patient left: in bed;with call bell/phone within reach;with bed alarm set  OT Visit Diagnosis: Other abnormalities of gait and mobility (R26.89);History of falling (Z91.81)                Time: 1355-1443 OT Time Calculation (min): 48 min Charges:  OT General Charges $OT Visit: 1 Visit OT Evaluation $OT Eval Low Complexity: 1 Low OT Treatments $Self Care/Home Management : 23-37 mins $Therapeutic Exercise: 8-22 mins  Dessie Coma, M.S. OTR/L  06/12/21, 2:55 PM  ascom [phone]

## 2021-06-12 NOTE — Plan of Care (Signed)

## 2021-06-12 NOTE — Progress Notes (Signed)
   06/12/21 2005  Assess: MEWS Score  Temp 97.9 F (36.6 C)  BP 100/63  Pulse Rate (!) 104  Level of Consciousness Alert  O2 Device Room Air  Assess: MEWS Score  MEWS Temp 0  MEWS Systolic 1  MEWS Pulse 1  MEWS RR 0  MEWS LOC 0  MEWS Score 2  MEWS Score Color Yellow  Assess: if the MEWS score is Yellow or Red  Were vital signs taken at a resting state? Yes  Focused Assessment No change from prior assessment  Does the patient meet 2 or more of the SIRS criteria? No  MEWS guidelines implemented *See Row Information* No, previously yellow, continue vital signs every 4 hours  Treat  Pain Scale 0-10  Pain Score 0  Document  Progress note created (see row info) Yes  Assess: SIRS CRITERIA  SIRS Temperature  0  SIRS Pulse 1  SIRS Respirations  0  SIRS WBC 0  SIRS Score Sum  1

## 2021-06-12 NOTE — Progress Notes (Signed)
*  PRELIMINARY RESULTS* Echocardiogram 2D Echocardiogram has been performed.  Sherrie Sport 06/12/2021, 12:26 PM

## 2021-06-12 NOTE — TOC Initial Note (Signed)
Transition of Care [facility]) - Initial/Assessment Note    Patient Details  Name: Chris Davis MRN: [id number] Date of Birth: 11/12/1932  [facility] CM/SW Contact:    Alberteen Sam, LCSW Phone Number: 06/12/2021, 1:49 PM  Clinical Narrative:                  CSW spoke with patient regarding SNF rec. Patient is from [facility] and is agreement to go to their SNF for short term rehab.   CSW spoke with Seth Bake at [facility] to inform of discharge plan.   Pending medical readiness to dc at this time.   Expected Discharge Plan: Skilled Nursing Facility Barriers to Discharge: Continued Medical Work up   Patient Goals and CMS Choice Patient states their goals for this hospitalization and ongoing recovery are:: to go to SNF CMS Medicare.gov Compare Post Acute Care list provided to:: Patient Choice offered to / list presented to : Patient  Expected Discharge Plan and Services Expected Discharge Plan: [place] Acute Care Choice: [place] Living arrangements for the past 2 months: [facility])                                      Prior Living Arrangements/Services Living arrangements for the past 2 months: [place]) Lives with:: Facility Resident          Need for Family Participation in Patient Care: Yes (Comment) Care giver support system in place?: Yes (comment)      Activities of Daily Living Home Assistive Devices/Equipment: Eyeglasses, Environmental consultant (specify type), Shower chair without back, Cane (specify quad or straight) ADL Screening (condition at time of admission) Patient's cognitive ability adequate to safely complete daily activities?: Yes Is the patient deaf or have difficulty hearing?: No Does the patient have difficulty seeing, even when wearing glasses/contacts?: No Does the patient have difficulty concentrating, remembering, or making decisions?:  No Patient able to express need for assistance with ADLs?: Yes Does the patient have difficulty dressing or bathing?: No Independently performs ADLs?: Yes (appropriate for developmental age) Does the patient have difficulty walking or climbing stairs?: Yes Weakness of Legs: Both Weakness of Arms/Hands: None  Permission Sought/Granted                  Emotional Assessment   Attitude/Demeanor/Rapport: Gracious Affect (typically observed): Calm Orientation: : Oriented to Self, Oriented to Place, Oriented to  Time, Oriented to Situation Alcohol / Substance Use: Not Applicable Psych Involvement: No (comment)  Admission diagnosis:  Gait instability [R26.81] Hypoxia [R09.02] Right leg weakness [R29.898] Anticoagulated [Z79.01] Recurrent pneumonia [J18.9] Longstanding persistent atrial fibrillation (HCC) [I48.11] Acute on chronic systolic (congestive) heart failure (HCC) [I50.23] Acute on chronic congestive heart failure, unspecified heart failure type ([place]) [I50.9] Patient Active Problem List   Diagnosis Date Noted   Acute on chronic systolic (congestive) heart failure ([place]) 06/12/2021   Peripheral edema 06/12/2021   Generalized weakness 06/12/2021   Falls 06/12/2021   Acute hyponatremia 06/12/2021   Lower limb ulcer, calf ([place]) 01/16/2021   Hypotension 12/16/2020   HLD (hyperlipidemia) 12/16/2020   Acute renal failure superimposed on stage 3a chronic kidney disease ([place]) 12/16/2020   Cellulitis of lower extremity 12/16/2020   Dizziness 11/28/2020   Stage 3b chronic kidney disease ([place]) 09/14/2020   Senile purpura ([place]) 03/09/2019  Bilateral leg edema 02/13/3142   Chronic systolic heart failure ([place]) 07/31/2017   Atrial fibrillation ([place]) 88/87/5797   Acute systolic CHF (congestive heart failure) ([place]) 07/21/2017   HTN (hypertension) 07/21/2017   Cancer of prostate ([place]) 08/27/2016   PCP:  Derinda Late, MD Pharmacy:   [facility] [place], [place] AT [place] [address] [place] [address] Phone: [phone] Fax: [phone]     Social Determinants of Health (SDOH) Interventions    Readmission Risk Interventions No flowsheet data found.

## 2021-06-12 NOTE — ED Notes (Signed)
J. Mansy notified of pt's BP, awaiting orders.

## 2021-06-12 NOTE — Consult Note (Signed)
[place] Nurse Consult Note: Reason for Consult: "patient will miss wound care appointment this week" Appears to be followed by vascular surgery.  Edema in the presence of venous stasis with ulceration Wound type: no open wounds Pressure Injury POA: Yes/No/NA Dressing procedure/placement/frequency: ED RN to remove Unna's boots in place for WOC to replace and assess need for topical care.  WOC reapplied bilateral Unna's boots. Will see weekly for changes until outpatient care resumed.  [place], [place], [place]

## 2021-06-12 NOTE — ED Provider Notes (Signed)
-----------------------------------------   12:28 AM on 06/12/2021 -----------------------------------------  Patient was very unsteady during ambulation trial using his walker.  Room air saturations down to 83%, improved with rest.  Placed on nasal cannula oxygen.  Patient takes Torsemide at home; will administer 1 mg IV Bumex.  Will discuss with hospitalist services for admission   Paulette Blanch, MD 06/12/21 [phone]

## 2021-06-13 DIAGNOSIS — I959 Hypotension, unspecified: Secondary | ICD-10-CM

## 2021-06-13 LAB — BASIC METABOLIC PANEL
Anion gap: 8 (ref 5–15)
BUN: 26 mg/dL — ABNORMAL HIGH (ref 8–23)
CO2: 21 mmol/L — ABNORMAL LOW (ref 22–32)
Calcium: 8.3 mg/dL — ABNORMAL LOW (ref 8.9–10.3)
Chloride: 105 mmol/L (ref 98–111)
Creatinine, Ser: 1.03 mg/dL (ref 0.61–1.24)
GFR, Estimated: >60 mL/min (ref >60)
Glucose, Bld: 113 mg/dL — ABNORMAL HIGH (ref 70–99)
Potassium: 4 mmol/L (ref 3.5–5.1)
Sodium: 134 mmol/L — ABNORMAL LOW (ref 135–145)

## 2021-06-13 LAB — CBC WITH DIFFERENTIAL/PLATELET
Abs Immature Granulocytes: 0.03 10*3/uL (ref 0.00–0.07)
Basophils Absolute: 0 10*3/uL (ref 0.0–0.1)
Basophils Relative: 0 %
Eosinophils Absolute: 0.2 10*3/uL (ref 0.0–0.5)
Eosinophils Relative: 3 %
HCT: 40.8 % (ref 39.0–52.0)
Hemoglobin: 13.7 g/dL (ref 13.0–17.0)
Immature Granulocytes: 0 %
Lymphocytes Relative: 14 %
Lymphs Abs: 1 10*3/uL (ref 0.7–4.0)
MCH: 30.2 pg (ref 26.0–34.0)
MCHC: 33.6 g/dL (ref 30.0–36.0)
MCV: 89.9 fL (ref 80.0–100.0)
Monocytes Absolute: 0.8 10*3/uL (ref 0.1–1.0)
Monocytes Relative: 12 %
Neutro Abs: 4.9 10*3/uL (ref 1.7–7.7)
Neutrophils Relative %: 71 %
Platelets: 220 10*3/uL (ref 150–400)
RBC: 4.54 MIL/uL (ref 4.22–5.81)
RDW: 15.6 % — ABNORMAL HIGH (ref 11.5–15.5)
WBC: 6.9 10*3/uL (ref 4.0–10.5)
nRBC: 0 % (ref 0.0–0.2)

## 2021-06-13 LAB — MAGNESIUM: Magnesium: 2.1 mg/dL (ref 1.7–2.4)

## 2021-06-13 LAB — CORTISOL-AM, BLOOD: Cortisol - AM: 15 ug/dL (ref 6.7–22.6)

## 2021-06-13 MED ORDER — LACTULOSE 10 GM/15ML PO SOLN
20.0000 g | Freq: Once | ORAL | Status: AC
  Administered 2021-06-13: 20 g via ORAL
  Filled 2021-06-13: qty 30

## 2021-06-13 MED ORDER — MIDODRINE HCL 5 MG PO TABS
2.5000 mg | ORAL_TABLET | Freq: Three times a day (TID) | ORAL | Status: DC
  Administered 2021-06-13 (×2): 2.5 mg via ORAL
  Filled 2021-06-13 (×2): qty 1

## 2021-06-13 MED ORDER — METOPROLOL TARTRATE 25 MG PO TABS
12.5000 mg | ORAL_TABLET | Freq: Two times a day (BID) | ORAL | Status: DC
  Administered 2021-06-13: 12.5 mg via ORAL
  Filled 2021-06-13: qty 1

## 2021-06-13 MED ORDER — CARVEDILOL 3.125 MG PO TABS: 3.1250 mg | ORAL_TABLET | Freq: Two times a day (BID) | ORAL | Status: DC

## 2021-06-13 NOTE — Progress Notes (Signed)
PROGRESS NOTE    Chris Davis  TDD:[id number] DOB: [birth year] DOA: 06/11/2021 PCP: Derinda Late, MD   Chief complaint.  Generalized weakness. Brief Narrative:  Chris Davis is a 87 y.o. male with medical history significant for chronic systolic heart failure with most recent echocardiogram in July 2020 showing LVEF 40%, persistent atrial fibrillation chronically anticoagulated on Eliquis, hypertension, hyperlipidemia, who is admitted to [facility] on 06/11/2021 with acute on chronic systolic heart failure after presenting from home [facility] assisted living) to [facility] ED complaining of generalized weakness..  Was diagnosed with acute on chronic systolic congestive heart failure, he was giving IV diuretics, he developed significant hypotension.  He received IV fluid bolus.   Assessment & Plan:   Principal Problem:   Acute on chronic systolic (congestive) heart failure (HCC) Active Problems:   HTN (hypertension)   Hypotension   HLD (hyperlipidemia)   Peripheral edema   Generalized weakness   Falls   Acute hyponatremia  #1.  Hypotension. Acute on chronic systolic congestive heart failure. Persistent atrial fibrillation. Patient does not have significant volume overload.  Diuretics has been discontinued.  Blood pressure is more stable after giving fluids, but patient still has significant intermittent hypotension.  Midodrine started at 2.5 mg 3 times a day.  In the meantime, I will restart a lower dose beta-blocker with Coreg 3.125mg  twice a day for congestive heart failure, also for better control of heart rate.  Continue Eliquis for stroke prevention. Also check cortisol level. Cardiology consult is obtained.  2.  Hyponatremia. Improving.  3.  Generalized weakness. PT/OT.    DVT prophylaxis: Eliquis Code Status: full Family Communication:  Disposition Plan:    Status is: Inpatient  Remains inpatient appropriate because:Inpatient level of care  appropriate due to severity of illness  Dispo: The patient is from: Home              Anticipated d/c is to: SNF              Patient currently is not medically stable to d/c.   Difficult to place patient No        I/O last 3 completed shifts: In: 1393.7 [P.O.:360; IV Piggyback:1033.7] Out: 350 [Urine:350] Total I/O In: -  Out: 71 [Urine:450]     Consultants:  Card  Procedures: None  Antimicrobials: None   Subjective: Patient doing well today, he slept well last night without any paroxysmal nocturnal dyspnea. He does not feel short of breath, he is not on oxygen. He has no cough. He does not have any dizziness lightheadedness today. No fever or chills. No dysuria hematuria.  Objective: Vitals:   06/13/21 0452 06/13/21 0458 06/13/21 0800 06/13/21 0818  BP: 96/69  (!) 92/59   Pulse: 100  (!) 109 98  Resp: 17  13   Temp: 97.8 F (36.6 C)  97.7 F (36.5 C)   TempSrc: Oral  Oral   SpO2: 98%  100%   Weight:  85.5 kg    Height:        Intake/Output Summary (Last 24 hours) at 06/13/2021 1203 Last data filed at 06/13/2021 0820 Gross per 24 hour  Intake 893.99 ml  Output 800 ml  Net 93.99 ml   Filed Weights   06/11/21 1945 06/13/21 0458  Weight: 82.6 kg 85.5 kg    Examination:  General exam: Appears calm and comfortable  Respiratory system: Clear to auscultation. Respiratory effort normal. Cardiovascular system: Irregularly irregular. No JVD, murmurs, rubs, gallops or clicks.  Gastrointestinal system: Abdomen is nondistended, soft and nontender. No organomegaly or masses felt. Normal bowel sounds heard. Central nervous system: Alert and oriented. No focal neurological deficits. Extremities: 1-2+ leg edema Skin: No rashes, lesions or ulcers Psychiatry: Judgement and insight appear normal. Mood & affect appropriate.     Data Reviewed: I have personally reviewed following labs and imaging studies  CBC: Recent Labs  Lab 06/11/21 2003 06/12/21 0657  06/13/21 0541  WBC 7.8 6.2 6.9  NEUTROABS 5.7  --  4.9  HGB 14.7 13.4 13.7  HCT 44.7 39.7 40.8  MCV 90.1 89.6 89.9  PLT 238 209 812   Basic Metabolic Panel: Recent Labs  Lab 06/11/21 2003 06/12/21 0657 06/13/21 0541  NA 133* 137 134*  K 4.8 4.1 4.0  CL 104 104 105  CO2 23 25 21*  GLUCOSE 87 88 113*  BUN 30* 26* 26*  CREATININE 1.08 1.05 1.03  CALCIUM 8.8* 8.5* 8.3*  MG 2.3 2.2 2.1  PHOS  --  3.3  --    GFR: Estimated Creatinine Clearance: 54.8 mL/min (by C-G formula based on SCr of 1.03 mg/dL). Liver Function Tests: Recent Labs  Lab 06/11/21 2003 06/12/21 0657  AST 25 19  ALT 17 13  ALKPHOS 91 77  BILITOT 1.4* 1.6*  PROT 6.8 5.9*  ALBUMIN 3.7 3.0*   No results for input(s): LIPASE, AMYLASE in the last 168 hours. No results for input(s): AMMONIA in the last 168 hours. Coagulation Profile: No results for input(s): INR, PROTIME in the last 168 hours. Cardiac Enzymes: No results for input(s): CKTOTAL, CKMB, CKMBINDEX, TROPONINI in the last 168 hours. BNP (last 3 results) No results for input(s): PROBNP in the last 8760 hours. HbA1C: No results for input(s): HGBA1C in the last 72 hours. CBG: No results for input(s): GLUCAP in the last 168 hours. Lipid Profile: No results for input(s): CHOL, HDL, LDLCALC, TRIG, CHOLHDL, LDLDIRECT in the last 72 hours. Thyroid Function Tests: Recent Labs    06/12/21 0657  TSH 1.361   Anemia Panel: No results for input(s): VITAMINB12, FOLATE, FERRITIN, TIBC, IRON, RETICCTPCT in the last 72 hours. Sepsis Labs: Recent Labs  Lab 06/12/21 0657  PROCALCITON <0.10    Recent Results (from the past 240 hour(s))  Resp Panel by RT-PCR (Flu A&B, Covid) Nasopharyngeal Swab     Status: None   Collection Time: 06/11/21  8:03 PM   Specimen: Nasopharyngeal Swab; Nasopharyngeal(NP) swabs in vial transport medium  Result Value Ref Range Status   SARS Coronavirus 2 by RT PCR NEGATIVE NEGATIVE Final    Comment: (NOTE) SARS-CoV-2 target  nucleic acids are NOT DETECTED.  The SARS-CoV-2 RNA is generally detectable in upper respiratory specimens during the acute phase of infection. The lowest concentration of SARS-CoV-2 viral copies this assay can detect is 138 copies/mL. A negative result does not preclude SARS-Cov-2 infection and should not be used as the sole basis for treatment or other patient management decisions. A negative result may occur with  improper specimen collection/handling, submission of specimen other than nasopharyngeal swab, presence of viral mutation(s) within the areas targeted by this assay, and inadequate number of viral copies(<138 copies/mL). A negative result must be combined with clinical observations, patient history, and epidemiological information. The expected result is Negative.  Fact Sheet for Patients:  EntrepreneurPulse.com.au  Fact Sheet for Healthcare Providers:  IncredibleEmployment.be  This test is no t yet approved or cleared by the [place] FDA and  has been authorized for detection and/or diagnosis of SARS-CoV-2  by FDA under an Emergency Use Authorization (EUA). This EUA will remain  in effect (meaning this test can be used) for the duration of the COVID-19 declaration under Section 564(b)(1) of the Act, 21 U.S.C.section 360bbb-3(b)(1), unless the authorization is terminated  or revoked sooner.       Influenza A by PCR NEGATIVE NEGATIVE Final   Influenza B by PCR NEGATIVE NEGATIVE Final    Comment: (NOTE) The Xpert Xpress SARS-CoV-2/FLU/RSV plus assay is intended as an aid in the diagnosis of influenza from Nasopharyngeal swab specimens and should not be used as a sole basis for treatment. Nasal washings and aspirates are unacceptable for Xpert Xpress SARS-CoV-2/FLU/RSV testing.  Fact Sheet for Patients: EntrepreneurPulse.com.au  Fact Sheet for Healthcare  Providers: IncredibleEmployment.be  This test is not yet approved or cleared by the [place] FDA and has been authorized for detection and/or diagnosis of SARS-CoV-2 by FDA under an Emergency Use Authorization (EUA). This EUA will remain in effect (meaning this test can be used) for the duration of the COVID-19 declaration under Section 564(b)(1) of the Act, 21 U.S.C. section 360bbb-3(b)(1), unless the authorization is terminated or revoked.  Performed at [facility], [address] [place], [place] [address]          Radiology Studies: DG Chest 2 View  Result Date: 06/11/2021 CLINICAL DATA:  Multiple falls, weakness EXAM: CHEST - 2 VIEW COMPARISON:  Radiograph 07/21/2017 FINDINGS: Chronically coarsened interstitial and bronchitic changes. Some peripheral septal (Kerley B) line are noted in the lung periphery with pulmonary vascular congestion and cephalization. Cardiomegaly is similar to comparison prior. The aorta is calcified. The remaining cardiomediastinal contours are unremarkable. No pneumothorax. The osseous structures appear diffusely demineralized which may limit detection of small or nondisplaced fractures. No acute osseous abnormality or suspicious osseous lesion. Telemetry leads overlie the chest. Chest wall soft tissues are unremarkable. Bowel gas pattern is nonobstructive IMPRESSION: Low volumes and atelectasis. Septal thickening and vascular congestion may suggest developing interstitial edema. Trace bilateral effusions suspected as well. Cardiomegaly and Aortic Atherosclerosis (ICD10-I70.0). Electronically Signed   By: Lovena Le M.D.   On: 06/11/2021 20:21   CT Head Wo Contrast  Result Date: 06/11/2021 CLINICAL DATA:  Status post multiple falls. EXAM: CT HEAD WITHOUT CONTRAST TECHNIQUE: Contiguous axial images were obtained from the base of the skull through the vertex without intravenous contrast. COMPARISON:  November 02, 2020  FINDINGS: Brain: There is mild cerebral atrophy with widening of the extra-axial spaces and ventricular dilatation. There are areas of decreased attenuation within the white matter tracts of the supratentorial brain, consistent with microvascular disease changes. Vascular: No hyperdense vessel or unexpected calcification. Skull: Normal. Negative for fracture or focal lesion. Sinuses/Orbits: A 1.8 cm x 1.3 cm right maxillary sinus polyp versus mucous retention cyst is seen. Other: None. IMPRESSION: 1. Generalized cerebral atrophy. 2. No acute intracranial abnormality. Electronically Signed   By: Virgina Norfolk M.D.   On: 06/11/2021 20:26   ECHOCARDIOGRAM COMPLETE  Result Date: 06/12/2021    ECHOCARDIOGRAM REPORT   Patient Name:   DEVONN GIAMPIETRO Date of Exam: 06/12/2021 Medical Rec #:  [id number]   Height:       68.0 in Accession #:    [id number]  Weight:       182.2 lb Date of Birth:  1932-05-18   BSA:          1.964 m Patient Age:    56 years    BP:  13/48 mmHg Patient Gender: M           HR:           73 bpm. Exam Location:  [facility] Procedure: 2D Echo, Cardiac Doppler and Color Doppler Indications:     CHF-acute systolic H60.73  History:         Patient has no prior history of Echocardiogram examinations.                  Arrythmias:Atrial Fibrillation; Risk Factors:Hypertension.  Sonographer:     Sherrie Sport RDCS (AE) Referring Phys:  [id number] Rhetta Mura Diagnosing Phys: Yolonda Kida MD  Sonographer Comments: No apical window and no subcostal window. IMPRESSIONS  1. Left ventricular ejection fraction, by estimation, is 70 to 75%. The left ventricle has hyperdynamic function. The left ventricle has no regional wall motion abnormalities. Left ventricular diastolic parameters were normal.  2. Right ventricular systolic function is mildly reduced. The right ventricular size is mildly enlarged. Mildly increased right ventricular wall thickness.  3. The mitral valve is normal in structure. Mild mitral  valve regurgitation.  4. The aortic valve is normal in structure. Aortic valve regurgitation is not visualized. Mild aortic valve sclerosis is present, with no evidence of aortic valve stenosis. FINDINGS  Left Ventricle: Left ventricular ejection fraction, by estimation, is 70 to 75%. The left ventricle has hyperdynamic function. The left ventricle has no regional wall motion abnormalities. The left ventricular internal cavity size was normal in size. There is no left ventricular hypertrophy. Left ventricular diastolic parameters were normal. Right Ventricle: The right ventricular size is mildly enlarged. Mildly increased right ventricular wall thickness. Right ventricular systolic function is mildly reduced. Left Atrium: Left atrial size was normal in size. Right Atrium: Right atrial size was normal in size. Pericardium: There is no evidence of pericardial effusion. Mitral Valve: The mitral valve is normal in structure. Mild mitral valve regurgitation. Tricuspid Valve: The tricuspid valve is normal in structure. Tricuspid valve regurgitation is trivial. Aortic Valve: The aortic valve is normal in structure. Aortic valve regurgitation is not visualized. Mild aortic valve sclerosis is present, with no evidence of aortic valve stenosis. Pulmonic Valve: The pulmonic valve was normal in structure. Pulmonic valve regurgitation is not visualized. Aorta: The ascending aorta was not well visualized. IAS/Shunts: No atrial level shunt detected by color flow Doppler.  LEFT VENTRICLE PLAX 2D LVIDd:         3.94 cm LVIDs:         2.19 cm LV PW:         0.88 cm LV IVS:        0.94 cm LVOT diam:     2.10 cm LVOT Area:     3.46 cm  LEFT ATRIUM         Index LA diam:    5.10 cm 2.60 cm/m                        PULMONIC VALVE AORTA                 PV Vmax:        0.60 m/s Ao Root diam: 3.70 cm PV Peak grad:   1.4 mmHg                       RVOT Peak grad: 4 mmHg   SHUNTS Systemic Diam: 2.10 cm Yolonda Kida MD Electronically  signed by Karma Greaser  Prince Rome MD Signature Date/Time: 06/12/2021/4:54:02 PM    Final    DG Hip Unilat W or Wo Pelvis 2-3 Views Right  Result Date: 06/11/2021 CLINICAL DATA:  87 year old male with fall and right hip pain. EXAM: DG HIP (WITH OR WITHOUT PELVIS) 2-3V RIGHT COMPARISON:  None. FINDINGS: There is no acute fracture or dislocation. The bones are osteopenic. Mild bilateral hip arthritic changes. There is slight cortical prominence of the lateral femoral heads which may predispose to cam type femoroacetabular impingement. The soft tissues are unremarkable. There acute therapy seeds of the prostate gland noted. IMPRESSION: 1. No acute fracture or dislocation. 2. Mild bilateral hip arthritic changes. Electronically Signed   By: Anner Crete M.D.   On: 06/11/2021 20:44        Scheduled Meds:  apixaban  5 mg Oral BID   atorvastatin  20 mg Oral QPM   carvedilol  3.125 mg Oral BID WC   empagliflozin  10 mg Oral Q breakfast   loratadine  10 mg Oral Daily   midodrine  2.5 mg Oral TID WC   multivitamin with minerals  1 tablet Oral Daily   Continuous Infusions:   LOS: 1 day    Time spent: 32 minutes    Sharen Hones, MD [facility]   To contact the attending provider between 7A-7P or the covering provider during after hours 7P-7A, please log into the web site [email or web address] and access using universal [place] password for that web site. If you do not have the password, please call the hospital operator.  06/13/2021, 12:03 PM

## 2021-06-13 NOTE — TOC Progression Note (Signed)
Transition of Care [facility]) - Progression Note    Patient Details  Name: Chris Davis MRN: [id number] Date of Birth: 03/26/1932  Transition of Care [facility]) CM/SW [place], LCSW Phone Number: 06/13/2021, 4:02 PM  Clinical Narrative:   Josem Kaufmann started    Expected Discharge Plan: [place] Barriers to Discharge: Continued Medical Work up  Expected Discharge Plan and Services Expected Discharge Plan: [place] Choice: [place] arrangements for the past 2 months: [facility])                                       Social Determinants of Health (SDOH) Interventions    Readmission Risk Interventions No flowsheet data found.

## 2021-06-14 ENCOUNTER — Inpatient Hospital Stay: Payer: Medicare PPO

## 2021-06-14 DIAGNOSIS — I4821 Permanent atrial fibrillation: Secondary | ICD-10-CM

## 2021-06-14 MED ORDER — HALOPERIDOL 2 MG PO TABS
2.0000 mg | ORAL_TABLET | Freq: Four times a day (QID) | ORAL | Status: DC | PRN
  Administered 2021-06-16: 2 mg via ORAL
  Filled 2021-06-14 (×2): qty 1

## 2021-06-14 MED ORDER — METOPROLOL TARTRATE 25 MG PO TABS
25.0000 mg | ORAL_TABLET | Freq: Two times a day (BID) | ORAL | Status: DC
  Administered 2021-06-14 (×2): 25 mg via ORAL
  Filled 2021-06-14 (×2): qty 1

## 2021-06-14 MED ORDER — DIGOXIN 0.25 MG/ML IJ SOLN
0.2500 mg | Freq: Once | INTRAMUSCULAR | Status: AC
  Administered 2021-06-14: 0.25 mg via INTRAVENOUS
  Filled 2021-06-14: qty 2

## 2021-06-14 MED ORDER — DILTIAZEM HCL 25 MG/5ML IV SOLN
5.0000 mg | Freq: Once | INTRAVENOUS | Status: AC
  Administered 2021-06-14: 5 mg via INTRAVENOUS
  Filled 2021-06-14: qty 5

## 2021-06-14 MED ORDER — DILTIAZEM HCL-DEXTROSE 125-5 MG/125ML-% IV SOLN (PREMIX): 5.0000 mg/h | INTRAVENOUS | Status: DC

## 2021-06-14 MED ORDER — SODIUM CHLORIDE 0.9 % IV BOLUS
250.0000 mL | Freq: Once | INTRAVENOUS | Status: AC
  Administered 2021-06-14: 250 mL via INTRAVENOUS

## 2021-06-14 MED ORDER — ALBUMIN HUMAN 25 % IV SOLN: 12.5000 g | Freq: Once | INTRAVENOUS | Status: DC

## 2021-06-14 MED ORDER — DILTIAZEM HCL 30 MG PO TABS
30.0000 mg | ORAL_TABLET | Freq: Four times a day (QID) | ORAL | Status: DC
  Administered 2021-06-14 – 2021-06-15 (×3): 30 mg via ORAL
  Filled 2021-06-14 (×3): qty 1

## 2021-06-14 NOTE — Consult Note (Signed)
   Heart Failure Nurse Navigator Note  HFpEF 70-75%  He presented to the emergency room with complaints of weakness.   He has a history of previously reported ejection fraction of 35 to 40%.   Comorbidities:  Persistent atrial fibrillation Hyperlipidemia Hypertension  Medications:  Apixaban 5 mg 2 times a day Atorvastatin 20 mg daily Jardiance 10 mg daily Metoprolol tartrate 25 mg twice a day  Labs:  On 6/29-sodium 134, potassium 4, chloride 105, CO2 21, BUN 26, creatinine 1.03, the same as 2.1. Weight is 84.7 kg down from 85.5 Intake 730 mL Output 1300 mL   Initial meeting with patient today.  He is currently sitting up in the chair at bedside, states that he is anxious to get working with physical therapy so he can go home.  States that he is followed in the outpatient heart failure clinic in the past.  Instructed that he has been set up with a post hospital visit, he is in agreement.  He states that he weighs himself daily, and if he notices a 2 pound weight gain overnight he has been instructed by his physician to take an extra torsemide.  He also states that he would report increasing shortness of breath or any edema.  Does not feel that he drinks more than 64 ounces in a 24-hour period and he states that the meals are supplied by [facility] assisted living and he does not add salt at the table.  He had no further questions, we will continue to follow along.  Pricilla Riffle RN CHFN

## 2021-06-14 NOTE — Progress Notes (Signed)
   06/14/21 0307  Assess: MEWS Score  Temp 98.3 F (36.8 C)  BP 109/76  Pulse Rate (!) 131  ECG Heart Rate (!) 132  Level of Consciousness Alert  SpO2 96 %  Assess: MEWS Score  MEWS Temp 0  MEWS Systolic 0  MEWS Pulse 3  MEWS RR 0  MEWS LOC 0  MEWS Score 3  MEWS Score Color Yellow  Assess: if the MEWS score is Yellow or Red  Were vital signs taken at a resting state? Yes  Focused Assessment No change from prior assessment  Does the patient meet 2 or more of the SIRS criteria? No  MEWS guidelines implemented *See Row Information* Yes  Treat  MEWS Interventions Escalated (See documentation below)  Pain Score 0  Take Vital Signs  Increase Vital Sign Frequency  Yellow: Q 2hr X 2 then Q 4hr X 2, if remains yellow, continue Q 4hrs  Escalate  MEWS: Escalate Yellow: discuss with charge nurse/RN and consider discussing with provider and RRT  Notify: Provider  Provider Name/Title Eugenie Norrie MD  Date Provider Notified 06/14/21  Time Provider Notified [phone]  Notification Type Page (secure chat)  Notification Reason Change in status (elevated HR, yellow mews)  Provider response See new orders  Date of Provider Response 06/14/21  Time of Provider Response [phone]  Document  Patient Outcome Stabilized after interventions  Progress note created (see row info) Yes  Assess: SIRS CRITERIA  SIRS Temperature  0  SIRS Pulse 1  SIRS Respirations  0  SIRS WBC 0  SIRS Score Sum  1

## 2021-06-14 NOTE — Progress Notes (Signed)
   06/14/21 1015  Clinical Encounter Type  Visited With Patient  Visit Type Follow-up;Spiritual support;Social support  Referral From Other (Comment) (rounding)  Spiritual Encounters  Spiritual Needs Other (Comment) (asked to reach out to a social worker)  Actor met with Dr. Florene Glen this am. He was in bed but awake and alert. He shared his hope to be connected with a Education officer, museum so that he could ask some questions about Medicare and planning. He also seemed eager to move forward with PT and getting out of bed. Dr. Florene Glen expressed that the day before was a "nothing day" where he may not have felt he received the attention he had hoped for or he may simply be feeling frustration. Daryel November offered that she would connect with his care team and offered reassurances of how important he is and that she would do what she could to help him feel he was receiving the care he needs. Will continue to follow.

## 2021-06-14 NOTE — Progress Notes (Signed)
Physical Therapy Treatment Patient Details Name: Chris Davis MRN: [id number] DOB: 1931-12-18 Today's Date: 06/14/2021    History of Present Illness 87 y.o. male with medical history significant for chronic systolic heart failure with most recent echocardiogram in July 2020 showing LVEF 40%, persistent atrial fibrillation chronically anticoagulated on Eliquis, hypertension, hyperlipidemia, who is admitted to [facility] on 06/11/2021 with acute on chronic systolic heart failure after presenting from home [facility] independent living) complaining of generalized weakness.    PT Comments    Patient seated in chair upon arrival to room; significant L lateral trunk lean noted.  Assisted with repositioning and transition from chair to bed due to fatigue.  Requiring significant increase in level of assist for sit/stand, standing balance and all transfers this date; demonstrating persistent L lateral trunk lean with all supine, sitting and standing positions.  Patient with limited awareness of L lateral lean (does worsen with fatigue, divided attention), minimal/no attempts at physical correction.  Unable to progress gait distance as result.  Do recommend heavy +2 with all mobility at this time.  Also of note, did note increased edema to L UE/elbow and blister-like rash to L posterior flank.   RN, MD informed/aware of findings during session and change in patient status.  MD to order head CT to rule out intracranial pathology.   Follow Up Recommendations  SNF     Equipment Recommendations  None recommended by PT    Recommendations for Other Services       Precautions / Restrictions Precautions Precautions: Fall Restrictions Weight Bearing Restrictions: No    Mobility  Bed Mobility Overal bed mobility: Needs Assistance Bed Mobility: Sit to Supine       Sit to supine: Max assist;+2 for physical assistance   General bed mobility comments: assist for trunk and bilat  LEs    Transfers Overall transfer level: Needs assistance Equipment used: Rolling walker (2 wheeled) Transfers: Sit to/from Stand Sit to Stand: Mod assist;Max assist;+2 physical assistance Stand pivot transfers: Mod assist;Max assist;+2 physical assistance       General transfer comment: very shuffling gait performance; significant manual faciltation for lateral weight shift to unweight/advance LEs. Maintains very forward flexed, kyphotic posture; L lateral lean increases with fatigue  Ambulation/Gait             General Gait Details: unsafe/unable this date   Stairs             Wheelchair Mobility    Modified Rankin (Stroke Patients Only)       Balance Overall balance assessment: Needs assistance Sitting-balance support: No upper extremity supported;Feet supported Sitting balance-Leahy Scale: Good     Standing balance support: Bilateral upper extremity supported Standing balance-Leahy Scale: Poor                              Cognition Arousal/Alertness: Awake/alert Behavior During Therapy: WFL for tasks assessed/performed Overall Cognitive Status: No family/caregiver present to determine baseline cognitive functioning                                 General Comments: Follows simple commands, but requires frequent redirection to task; generally forgetful and distracted by external environment.  Increased to process and initiate all functional tasks      Exercises Other Exercises Other Exercises: Unsupported sitting edge of bed, maintains very forward flexed/kyphotic posture, L lateral lean.  Limited awareness or effort to correct lean; min assist required for safety throughout.    General Comments        Pertinent Vitals/Pain Pain Assessment: No/denies pain    Home Living                      Prior Function            PT Goals (current goals can now be found in the care plan section) Acute Rehab PT  Goals Patient Stated Goal: get stronger PT Goal Formulation: With patient Time For Goal Achievement: 06/26/21 Potential to Achieve Goals: Fair Progress towards PT goals: Progressing toward goals    Frequency    Min 2X/week      PT Plan Current plan remains appropriate    Co-evaluation              AM-PAC PT "6 Clicks" Mobility   Outcome Measure  Help needed turning from your back to your side while in a flat bed without using bedrails?: Total Help needed moving from lying on your back to sitting on the side of a flat bed without using bedrails?: Total Help needed moving to and from a bed to a chair (including a wheelchair)?: A Lot Help needed standing up from a chair using your arms (e.g., wheelchair or bedside chair)?: A Lot Help needed to walk in hospital room?: A Lot Help needed climbing 3-5 steps with a railing? : Total 6 Click Score: 9    End of Session Equipment Utilized During Treatment: Gait belt Activity Tolerance: Patient limited by fatigue Patient left: in chair;with call bell/phone within reach;with chair alarm set Nurse Communication: Mobility status PT Visit Diagnosis: Muscle weakness (generalized) (M62.81);Difficulty in walking, not elsewhere classified (R26.2);Unsteadiness on feet (R26.81)     Time: 0321-2248 PT Time Calculation (min) (ACUTE ONLY): 23 min  Charges:  $Therapeutic Activity: 23-37 mins                     Tammera Engert H. Owens Shark, PT, DPT, NCS 06/14/21, 3:35 PM [phone]

## 2021-06-14 NOTE — Progress Notes (Signed)
[facility] Cardiology    SUBJECTIVE: Patient states he feels much better less shortness of breath denies any chest pain he is anticipating physical therapy to help with ambulation and walking   Vitals:   06/14/21 0400 06/14/21 0500 06/14/21 0600 06/14/21 0734  BP: 121/81  100/74 118/80  Pulse: (!) 127 (!) 112  (!) 110  Resp: 18  18 18   Temp: 98.7 F (37.1 C)  98.9 F (37.2 C) 99.2 F (37.3 C)  TempSrc: Oral  Oral Oral  SpO2:    96%  Weight:  84.7 kg    Height:         Intake/Output Summary (Last 24 hours) at 06/14/2021 1914 Last data filed at 06/14/2021 0400 Gross per 24 hour  Intake 730.2 ml  Output 850 ml  Net -119.8 ml      PHYSICAL EXAM  General: Well developed, well nourished, in no acute distress HEENT:  Normocephalic and atramatic Neck:  No JVD.  Lungs: Diminished bilaterally to auscultation and percussion.  No rales Heart: HRRR . Normal S1 and S2 without gallops or murmurs.  Abdomen: Bowel sounds are positive, abdomen soft and non-tender  Msk:  Back normal, normal gait. Normal strength and tone for age. Extremities: No clubbing, cyanosis or 3+edema.  Both legs currently wrapped Neuro: Alert and oriented X 3. Psych:  Good affect, responds appropriately   LABS: Basic Metabolic Panel: Recent Labs    06/12/21 0657 06/13/21 0541  NA 137 134*  K 4.1 4.0  CL 104 105  CO2 25 21*  GLUCOSE 88 113*  BUN 26* 26*  CREATININE 1.05 1.03  CALCIUM 8.5* 8.3*  MG 2.2 2.1  PHOS 3.3  --    Liver Function Tests: Recent Labs    06/11/21 2003 06/12/21 0657  AST 25 19  ALT 17 13  ALKPHOS 91 77  BILITOT 1.4* 1.6*  PROT 6.8 5.9*  ALBUMIN 3.7 3.0*   No results for input(s): LIPASE, AMYLASE in the last 72 hours. CBC: Recent Labs    06/11/21 2003 06/12/21 0657 06/13/21 0541  WBC 7.8 6.2 6.9  NEUTROABS 5.7  --  4.9  HGB 14.7 13.4 13.7  HCT 44.7 39.7 40.8  MCV 90.1 89.6 89.9  PLT 238 209 220   Cardiac Enzymes: No results for input(s): CKTOTAL, CKMB, CKMBINDEX,  TROPONINI in the last 72 hours. BNP: Invalid input(s): POCBNP D-Dimer: No results for input(s): DDIMER in the last 72 hours. Hemoglobin A1C: No results for input(s): HGBA1C in the last 72 hours. Fasting Lipid Panel: No results for input(s): CHOL, HDL, LDLCALC, TRIG, CHOLHDL, LDLDIRECT in the last 72 hours. Thyroid Function Tests: Recent Labs    06/12/21 0657  TSH 1.361   Anemia Panel: No results for input(s): VITAMINB12, FOLATE, FERRITIN, TIBC, IRON, RETICCTPCT in the last 72 hours.  ECHOCARDIOGRAM COMPLETE  Result Date: 06/12/2021    ECHOCARDIOGRAM REPORT   Patient Name:   Chris Davis Date of Exam: 06/12/2021 Medical Rec #:  [id number]   Height:       68.0 in Accession #:    [id number]  Weight:       182.2 lb Date of Birth:  [birth year]   BSA:          1.964 m Patient Age:    87 years    BP:           13/48 mmHg Patient Gender: M           HR:  73 bpm. Exam Location:  [facility] Procedure: 2D Echo, Cardiac Doppler and Color Doppler Indications:     CHF-acute systolic Z61.09  History:         Patient has no prior history of Echocardiogram examinations.                  Arrythmias:Atrial Fibrillation; Risk Factors:Hypertension.  Sonographer:     Sherrie Sport RDCS (AE) Referring Phys:  [id number] Rhetta Mura Diagnosing Phys: Yolonda Kida MD  Sonographer Comments: No apical window and no subcostal window. IMPRESSIONS  1. Left ventricular ejection fraction, by estimation, is 70 to 75%. The left ventricle has hyperdynamic function. The left ventricle has no regional wall motion abnormalities. Left ventricular diastolic parameters were normal.  2. Right ventricular systolic function is mildly reduced. The right ventricular size is mildly enlarged. Mildly increased right ventricular wall thickness.  3. The mitral valve is normal in structure. Mild mitral valve regurgitation.  4. The aortic valve is normal in structure. Aortic valve regurgitation is not visualized. Mild aortic valve sclerosis is  present, with no evidence of aortic valve stenosis. FINDINGS  Left Ventricle: Left ventricular ejection fraction, by estimation, is 70 to 75%. The left ventricle has hyperdynamic function. The left ventricle has no regional wall motion abnormalities. The left ventricular internal cavity size was normal in size. There is no left ventricular hypertrophy. Left ventricular diastolic parameters were normal. Right Ventricle: The right ventricular size is mildly enlarged. Mildly increased right ventricular wall thickness. Right ventricular systolic function is mildly reduced. Left Atrium: Left atrial size was normal in size. Right Atrium: Right atrial size was normal in size. Pericardium: There is no evidence of pericardial effusion. Mitral Valve: The mitral valve is normal in structure. Mild mitral valve regurgitation. Tricuspid Valve: The tricuspid valve is normal in structure. Tricuspid valve regurgitation is trivial. Aortic Valve: The aortic valve is normal in structure. Aortic valve regurgitation is not visualized. Mild aortic valve sclerosis is present, with no evidence of aortic valve stenosis. Pulmonic Valve: The pulmonic valve was normal in structure. Pulmonic valve regurgitation is not visualized. Aorta: The ascending aorta was not well visualized. IAS/Shunts: No atrial level shunt detected by color flow Doppler.  LEFT VENTRICLE PLAX 2D LVIDd:         3.94 cm LVIDs:         2.19 cm LV PW:         0.88 cm LV IVS:        0.94 cm LVOT diam:     2.10 cm LVOT Area:     3.46 cm  LEFT ATRIUM         Index LA diam:    5.10 cm 2.60 cm/m                        PULMONIC VALVE AORTA                 PV Vmax:        0.60 m/s Ao Root diam: 3.70 cm PV Peak grad:   1.4 mmHg                       RVOT Peak grad: 4 mmHg   SHUNTS Systemic Diam: 2.10 cm Holden Draughon Prince Rome MD Electronically signed by Yolonda Kida MD Signature Date/Time: 06/12/2021/4:54:02 PM    Final      Echo preserved overall left ventricular function around  70%  TELEMETRY:  Atrial fibrillation rate controlled at about 165:  ASSESSMENT AND PLAN:  Principal Problem:   Acute on chronic systolic (congestive) heart failure (HCC) Active Problems:   HTN (hypertension)   Atrial fibrillation (HCC)   Hypotension   HLD (hyperlipidemia)   Peripheral edema   Generalized weakness   Falls   Acute hyponatremia Diabetes type 2  Plan Doing reasonably well on telemetry Continue diuresis with IV Lasix Diastolic congestive heart failure continue medical therapy Rate control for atrial fibrillation Lopressor Continue Eliquis anticoagulation 5 mg twice a day Continue diabetes management and control Agree with Lipitor therapy for lipid management Agree with physical therapy to help with strength training and ambulation Recommend medical therapy at this point Hopefully can discharge and follow-up as an outpatient soon   Yolonda Kida, MD 06/14/2021 9:22 AM

## 2021-06-14 NOTE — Progress Notes (Addendum)
PROGRESS NOTE    Chris Davis  VOZ:[id number] DOB: Nov 27, 1932 DOA: 06/11/2021 PCP: Derinda Late, MD   Chief complaint.  Generalized weakness. Brief Narrative:   Chris Davis is a 87 y.o. male with medical history significant for chronic systolic heart failure with most recent echocardiogram in July 2020 showing LVEF 40%, persistent atrial fibrillation chronically anticoagulated on Eliquis, hypertension, hyperlipidemia, who is admitted to [facility] on 06/11/2021 with acute on chronic systolic heart failure after presenting from home [facility] assisted living) to [facility] ED complaining of generalized weakness..  Was diagnosed with acute on chronic systolic congestive heart failure, he was giving IV diuretics, he developed significant hypotension.  He received IV fluid bolus.  He was put back on metoprolol on 6/29. His heart rate went up on 6/30 with atrial fibrillation.  He is given IV diltiazem, and digoxin.   Assessment & Plan:   Principal Problem:   Acute on chronic systolic (congestive) heart failure (HCC) Active Problems:   HTN (hypertension)   Hypotension   HLD (hyperlipidemia)   Peripheral edema   Generalized weakness   Falls   Acute hyponatremia  #1.  Hypotension. Acute on chronic systolic congestive heart failure. Persistent atrial fibrillation with rapid ventricular response. Patient still does not have significant volume overload.  Currently on lower dose metoprolol, increased to 25 mg twice a day due to fast heart rate. He is also given a dose of IV digoxin, IV diltiazem. Cardiology consult will be obtained. His blood pressures seem to be better today, I will discontinue midodrine which was started yesterday.  I will give albumin if blood pressure drops again. Cortisol level 15.  #2.  Hyponatremia Improved.  3.  Generalized weakness. PT/OT.   DVT prophylaxis: Eliquis Code Status: DNR Family Communication: wife updated. Disposition Plan:     Status is: Inpatient  Remains inpatient appropriate because:Inpatient level of care appropriate due to severity of illness  Dispo: The patient is from: Home              Anticipated d/c is to: SNF              Patient currently is not medically stable to d/c.   Difficult to place patient No        I/O last 3 completed shifts: In: 850.2 [P.O.:600; IV Piggyback:250.2] Out: 1300 [Urine:1300] No intake/output data recorded.     Consultants:  cardiology  Procedures: none  Antimicrobials: none   Subjective: Patient slept well last night, no short of breath.  He was able to laying flat. He has no cough. But he developed tachycardia with atrial fibrillation, he was given 5 mg of diltiazem IV. He denies any abdominal pain or nausea vomiting. No dysuria hematuria. No fever or chills.  Objective: Vitals:   06/14/21 0400 06/14/21 0500 06/14/21 0600 06/14/21 0734  BP: 121/81  100/74 118/80  Pulse: (!) 127 (!) 112  (!) 110  Resp: 18  18 18   Temp: 98.7 F (37.1 C)  98.9 F (37.2 C) 99.2 F (37.3 C)  TempSrc: Oral  Oral Oral  SpO2:    96%  Weight:  84.7 kg    Height:        Intake/Output Summary (Last 24 hours) at 06/14/2021 0825 Last data filed at 06/14/2021 0400 Gross per 24 hour  Intake 730.2 ml  Output 850 ml  Net -119.8 ml   Filed Weights   06/11/21 1945 06/13/21 0458 06/14/21 0500  Weight: 82.6 kg 85.5 kg 84.7 kg  Examination:  General exam: Appears calm and comfortable  Respiratory system: Clear to auscultation. Respiratory effort normal. Cardiovascular system: Irregularly irregular, tachycardic. No JVD, murmurs, rubs, gallops or clicks. 1-2+ pedal edema. Gastrointestinal system: Abdomen is nondistended, soft and nontender. No organomegaly or masses felt. Normal bowel sounds heard. Central nervous system: Alert and oriented. No focal neurological deficits. Extremities: Symmetric 5 x 5 power. Skin: No rashes, lesions or ulcers Psychiatry:  Mood &  affect appropriate.     Data Reviewed: I have personally reviewed following labs and imaging studies  CBC: Recent Labs  Lab 06/11/21 2003 06/12/21 0657 06/13/21 0541  WBC 7.8 6.2 6.9  NEUTROABS 5.7  --  4.9  HGB 14.7 13.4 13.7  HCT 44.7 39.7 40.8  MCV 90.1 89.6 89.9  PLT 238 209 850   Basic Metabolic Panel: Recent Labs  Lab 06/11/21 2003 06/12/21 0657 06/13/21 0541  NA 133* 137 134*  K 4.8 4.1 4.0  CL 104 104 105  CO2 23 25 21*  GLUCOSE 87 88 113*  BUN 30* 26* 26*  CREATININE 1.08 1.05 1.03  CALCIUM 8.8* 8.5* 8.3*  MG 2.3 2.2 2.1  PHOS  --  3.3  --    GFR: Estimated Creatinine Clearance: 54.5 mL/min (by C-G formula based on SCr of 1.03 mg/dL). Liver Function Tests: Recent Labs  Lab 06/11/21 2003 06/12/21 0657  AST 25 19  ALT 17 13  ALKPHOS 91 77  BILITOT 1.4* 1.6*  PROT 6.8 5.9*  ALBUMIN 3.7 3.0*   No results for input(s): LIPASE, AMYLASE in the last 168 hours. No results for input(s): AMMONIA in the last 168 hours. Coagulation Profile: No results for input(s): INR, PROTIME in the last 168 hours. Cardiac Enzymes: No results for input(s): CKTOTAL, CKMB, CKMBINDEX, TROPONINI in the last 168 hours. BNP (last 3 results) No results for input(s): PROBNP in the last 8760 hours. HbA1C: No results for input(s): HGBA1C in the last 72 hours. CBG: No results for input(s): GLUCAP in the last 168 hours. Lipid Profile: No results for input(s): CHOL, HDL, LDLCALC, TRIG, CHOLHDL, LDLDIRECT in the last 72 hours. Thyroid Function Tests: Recent Labs    06/12/21 0657  TSH 1.361   Anemia Panel: No results for input(s): VITAMINB12, FOLATE, FERRITIN, TIBC, IRON, RETICCTPCT in the last 72 hours. Sepsis Labs: Recent Labs  Lab 06/12/21 0657  PROCALCITON <0.10    Recent Results (from the past 240 hour(s))  Resp Panel by RT-PCR (Flu A&B, Covid) Nasopharyngeal Swab     Status: None   Collection Time: 06/11/21  8:03 PM   Specimen: Nasopharyngeal Swab;  Nasopharyngeal(NP) swabs in vial transport medium  Result Value Ref Range Status   SARS Coronavirus 2 by RT PCR NEGATIVE NEGATIVE Final    Comment: (NOTE) SARS-CoV-2 target nucleic acids are NOT DETECTED.  The SARS-CoV-2 RNA is generally detectable in upper respiratory specimens during the acute phase of infection. The lowest concentration of SARS-CoV-2 viral copies this assay can detect is 138 copies/mL. A negative result does not preclude SARS-Cov-2 infection and should not be used as the sole basis for treatment or other patient management decisions. A negative result may occur with  improper specimen collection/handling, submission of specimen other than nasopharyngeal swab, presence of viral mutation(s) within the areas targeted by this assay, and inadequate number of viral copies(<138 copies/mL). A negative result must be combined with clinical observations, patient history, and epidemiological information. The expected result is Negative.  Fact Sheet for Patients:  EntrepreneurPulse.com.au  Fact Sheet for  Healthcare Providers:  IncredibleEmployment.be  This test is no t yet approved or cleared by the [place] and  has been authorized for detection and/or diagnosis of SARS-CoV-2 by FDA under an Emergency Use Authorization (EUA). This EUA will remain  in effect (meaning this test can be used) for the duration of the COVID-19 declaration under Section 564(b)(1) of the Act, 21 U.S.C.section 360bbb-3(b)(1), unless the authorization is terminated  or revoked sooner.       Influenza A by PCR NEGATIVE NEGATIVE Final   Influenza B by PCR NEGATIVE NEGATIVE Final    Comment: (NOTE) The Xpert Xpress SARS-CoV-2/FLU/RSV plus assay is intended as an aid in the diagnosis of influenza from Nasopharyngeal swab specimens and should not be used as a sole basis for treatment. Nasal washings and aspirates are unacceptable for Xpert Xpress  SARS-CoV-2/FLU/RSV testing.  Fact Sheet for Patients: EntrepreneurPulse.com.au  Fact Sheet for Healthcare Providers: IncredibleEmployment.be  This test is not yet approved or cleared by the [place] FDA and has been authorized for detection and/or diagnosis of SARS-CoV-2 by FDA under an Emergency Use Authorization (EUA). This EUA will remain in effect (meaning this test can be used) for the duration of the COVID-19 declaration under Section 564(b)(1) of the Act, 21 U.S.C. section 360bbb-3(b)(1), unless the authorization is terminated or revoked.  Performed at [facility], [address], [place], [place] [address]          Radiology Studies: ECHOCARDIOGRAM COMPLETE  Result Date: 06/12/2021    ECHOCARDIOGRAM REPORT   Patient Name:   MILLIE SHORB Date of Exam: 06/12/2021 Medical Rec #:  [id number]   Height:       68.0 in Accession #:    [id number]  Weight:       182.2 lb Date of Birth:  Oct 25, 1932   BSA:          1.964 m Patient Age:    75 years    BP:           13/48 mmHg Patient Gender: M           HR:           73 bpm. Exam Location:  [facility] Procedure: 2D Echo, Cardiac Doppler and Color Doppler Indications:     CHF-acute systolic J49.70  History:         Patient has no prior history of Echocardiogram examinations.                  Arrythmias:Atrial Fibrillation; Risk Factors:Hypertension.  Sonographer:     Sherrie Sport RDCS (AE) Referring Phys:  [id number] Rhetta Mura Diagnosing Phys: Yolonda Kida MD  Sonographer Comments: No apical window and no subcostal window. IMPRESSIONS  1. Left ventricular ejection fraction, by estimation, is 70 to 75%. The left ventricle has hyperdynamic function. The left ventricle has no regional wall motion abnormalities. Left ventricular diastolic parameters were normal.  2. Right ventricular systolic function is mildly reduced. The right ventricular size is mildly enlarged. Mildly increased right  ventricular wall thickness.  3. The mitral valve is normal in structure. Mild mitral valve regurgitation.  4. The aortic valve is normal in structure. Aortic valve regurgitation is not visualized. Mild aortic valve sclerosis is present, with no evidence of aortic valve stenosis. FINDINGS  Left Ventricle: Left ventricular ejection fraction, by estimation, is 70 to 75%. The left ventricle has hyperdynamic function. The left ventricle has no regional wall motion abnormalities. The left ventricular internal cavity size was  normal in size. There is no left ventricular hypertrophy. Left ventricular diastolic parameters were normal. Right Ventricle: The right ventricular size is mildly enlarged. Mildly increased right ventricular wall thickness. Right ventricular systolic function is mildly reduced. Left Atrium: Left atrial size was normal in size. Right Atrium: Right atrial size was normal in size. Pericardium: There is no evidence of pericardial effusion. Mitral Valve: The mitral valve is normal in structure. Mild mitral valve regurgitation. Tricuspid Valve: The tricuspid valve is normal in structure. Tricuspid valve regurgitation is trivial. Aortic Valve: The aortic valve is normal in structure. Aortic valve regurgitation is not visualized. Mild aortic valve sclerosis is present, with no evidence of aortic valve stenosis. Pulmonic Valve: The pulmonic valve was normal in structure. Pulmonic valve regurgitation is not visualized. Aorta: The ascending aorta was not well visualized. IAS/Shunts: No atrial level shunt detected by color flow Doppler.  LEFT VENTRICLE PLAX 2D LVIDd:         3.94 cm LVIDs:         2.19 cm LV PW:         0.88 cm LV IVS:        0.94 cm LVOT diam:     2.10 cm LVOT Area:     3.46 cm  LEFT ATRIUM         Index LA diam:    5.10 cm 2.60 cm/m                        PULMONIC VALVE AORTA                 PV Vmax:        0.60 m/s Ao Root diam: 3.70 cm PV Peak grad:   1.4 mmHg                       RVOT  Peak grad: 4 mmHg   SHUNTS Systemic Diam: 2.10 cm Dwayne D Callwood MD Electronically signed by Yolonda Kida MD Signature Date/Time: 06/12/2021/4:54:02 PM    Final         Scheduled Meds:  apixaban  5 mg Oral BID   atorvastatin  20 mg Oral QPM   empagliflozin  10 mg Oral Q breakfast   loratadine  10 mg Oral Daily   metoprolol tartrate  25 mg Oral BID   multivitamin with minerals  1 tablet Oral Daily   Continuous Infusions:   LOS: 2 days    Time spent: 36 minutes    Sharen Hones, MD [facility]   To contact the attending provider between 7A-7P or the covering provider during after hours 7P-7A, please log into the web site [email or web address] and access using universal [place] password for that web site. If you do not have the password, please call the hospital operator.  06/14/2021, 8:25 AM

## 2021-06-14 NOTE — Progress Notes (Signed)
Addendum progress note.  Patient is an 87 year old male with a history of chronic systolic heart failure with an echocardiogram in July 2020 showing an EF of 40% with persistent atrial fibrillation which has been anticoagulated with Eliquis along with hypertension and hyperlipidemia.  He presented to the emergency room from his care facility complaining generalized fatigue and weakness.  Chest x-ray revealed lung volumes and atelectasis.  There were septal thickening and vascular congestion.  Initially was treated with diuretics however developed hypotension and his diuretics were stopped and he was given fluid resuscitation.  He was admitted on 06/11/2021.  Brain CT revealed generalized cerebral atrophy with no acute intracranial abnormality.  Laboratories on admission revealed a sodium of 133 with a creatinine of 1.08.  BNP was 386.7.  Is of note that 6 months ago was 432.3.  He was also very dizzy on presentation and this improved with fluid resuscitation.  He was taken off he has antihypertensive medications which included beta-blocker, ACE inhibitor and diuretics.  Clinically was not felt to be volume overloaded.  He was in persistent atrial fibrillation and Eliquis was continued.  He was restarted on a lower dose beta-blocker with metoprolol 25 twice daily yesterday.  This a.m. he developed more rapid A. fib.  He was given IV diltiazem and digoxin.  A bolus of IV diltiazem and digoxin and previous rate transiently but he has subsequently developed further rapid ventricular response.  He is oriented to person time and time but call to the answering some questions.  1.  CHF-patient does not clinically appear to be in congestive heart failure.  Would remain off diuretics.  Would continue with metoprolol at 25 twice daily and will continue with Jardiance.  We will need to follow electrolytes, hemodynamics and renal function.  We will continue to hold diuretics for now.  2.  A. fib with RVR-we will attempt to  control rate with diltiazem 30 every 6 and continue with metoprolol tartrate 25 twice daily.  May need to use intermittent doses of IV or p.o. digoxin if rate continues to be a problem and blood pressure does not allow more aggressive beta or calcium blocker therapy.  Would continue with Eliquis at 5 mg twice daily following renal function.  3.  Altered mental status-this is the first time I have spoken with him.  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  There are some inappropriate questions.  Would agree with head CT.  If there is any intracranial issues consideration for holding anticoagulation may be necessary.  We will continue

## 2021-06-15 DIAGNOSIS — I4819 Other persistent atrial fibrillation: Secondary | ICD-10-CM

## 2021-06-15 DIAGNOSIS — I9589 Other hypotension: Secondary | ICD-10-CM

## 2021-06-15 LAB — BASIC METABOLIC PANEL
Anion gap: 5 (ref 5–15)
BUN: 29 mg/dL — ABNORMAL HIGH (ref 8–23)
CO2: 22 mmol/L (ref 22–32)
Calcium: 8.4 mg/dL — ABNORMAL LOW (ref 8.9–10.3)
Chloride: 107 mmol/L (ref 98–111)
Creatinine, Ser: 0.96 mg/dL (ref 0.61–1.24)
GFR, Estimated: >60 mL/min (ref >60)
Glucose, Bld: 102 mg/dL — ABNORMAL HIGH (ref 70–99)
Potassium: 4.8 mmol/L (ref 3.5–5.1)
Sodium: 134 mmol/L — ABNORMAL LOW (ref 135–145)

## 2021-06-15 LAB — METHYLMALONIC ACID, SERUM: Methylmalonic Acid, Quantitative: 247 nmol/L (ref 0–378)

## 2021-06-15 LAB — MAGNESIUM: Magnesium: 2.1 mg/dL (ref 1.7–2.4)

## 2021-06-15 MED ORDER — METOPROLOL TARTRATE 50 MG PO TABS
50.0000 mg | ORAL_TABLET | Freq: Two times a day (BID) | ORAL | Status: DC
  Administered 2021-06-15 – 2021-06-19 (×9): 50 mg via ORAL
  Filled 2021-06-15 (×9): qty 1

## 2021-06-15 MED ORDER — ZINC OXIDE 12.8 % EX OINT: TOPICAL_OINTMENT | Freq: Two times a day (BID) | CUTANEOUS | Status: DC

## 2021-06-15 MED ORDER — GERHARDT'S BUTT CREAM
TOPICAL_CREAM | Freq: Two times a day (BID) | CUTANEOUS | Status: DC
  Administered 2021-06-16: 1 via TOPICAL
  Filled 2021-06-15 (×2): qty 1

## 2021-06-15 MED ORDER — METOPROLOL TARTRATE 50 MG PO TABS
50.0000 mg | ORAL_TABLET | Freq: Two times a day (BID) | ORAL | 0 refills | Status: DC
Start: 2021-06-15 — End: 2022-10-18

## 2021-06-15 NOTE — Progress Notes (Signed)
Occupational Therapy Treatment Patient Details Name: Chris Davis MRN: [id number] DOB: 11-05-1932 Today's Date: 06/15/2021    History of present illness 87 y.o. male with medical history significant for chronic systolic heart failure with most recent echocardiogram in July 2020 showing LVEF 40%, persistent atrial fibrillation chronically anticoagulated on Eliquis, hypertension, hyperlipidemia, who is admitted to [facility] on 06/11/2021 with acute on chronic systolic heart failure after presenting from home [facility] independent living) complaining of generalized weakness.   OT comments  Chris Davis was seen for OT treatment on this date. Upon arrival to room pt reclined in bed, agreeable to seated therex. Attempted to exit L side of bed, pt reports pain (not quantified) and defers mobilty, MAX A return BLE to bed. Pt completed bed level BUE therex as described below. Pt requires visual demonstration and MIN cues t/o for technique. Pt fatigues quickly requiring multiple rest breaks during sets. Pt making progress toward goals. Pt continues to benefit from skilled OT services to maximize return to PLOF and minimize risk of future falls, injury, caregiver burden, and readmission. Will continue to follow POC. Discharge recommendation remains appropriate.    Follow Up Recommendations  SNF    Equipment Recommendations  Other (comment) (TBD)    Recommendations for Other Services      Precautions / Restrictions Precautions Precautions: Fall Restrictions Weight Bearing Restrictions: No       Mobility Bed Mobility Overal bed mobility: Needs Assistance             General bed mobility comments: Attempted to exit L side of bed, pt reports pain (not quantified) and defers, MAX A return BLE to bed.                      ADL either performed or assessed with clinical judgement   ADL Overall ADL's : Needs assistance/impaired                                        General ADL Comments: MAX A for LB access at bed level      Cognition Arousal/Alertness: Awake/alert Behavior During Therapy: WFL for tasks assessed/performed Overall Cognitive Status: No family/caregiver present to determine baseline cognitive functioning                                 General Comments: Follows simple commands, but requires frequent redirection to task; generally forgetful and distracted by external environment.  Increased to process and initiate all functional tasks        Exercises Exercises: Other exercises General Exercises - Upper Extremity Shoulder Flexion: AROM;Strengthening;Both;10 reps;Supine Shoulder Extension: AROM;Strengthening;Both;10 reps;Supine Shoulder ABduction: AROM;Strengthening;Both;10 reps;Supine Shoulder ADduction: AROM;Strengthening;Both;10 reps;Supine Shoulder Horizontal ABduction: AROM;Strengthening;Both;10 reps;Supine Shoulder Horizontal ADduction: AROM;Strengthening;Both;10 reps;Supine Elbow Flexion: AROM;Strengthening;Both;10 reps;Supine Elbow Extension: AROM;Strengthening;Both;10 reps;Supine Other Exercises Other Exercises: Pt educated re: OT role, HEP, falls prevention Other Exercises: sup>sit, LBD   Shoulder Instructions       General Comments      Pertinent Vitals/ Pain       Pain Assessment: No/denies pain         Frequency  Min 1X/week        Progress Toward Goals  OT Goals(current goals can now be found in the care plan section)  Progress towards OT goals: Progressing toward goals  Acute Rehab  OT Goals Patient Stated Goal: get stronger OT Goal Formulation: With patient Time For Goal Achievement: 06/26/21 Potential to Achieve Goals: Good ADL Goals Pt Will Perform Grooming: with min guard assist;standing Pt Will Transfer to Toilet: with modified independence;bedside commode;ambulating Pt Will Perform Toileting - Clothing Manipulation and hygiene: with modified  independence;sitting/lateral leans  Plan Discharge plan remains appropriate;Frequency remains appropriate       AM-PAC OT "6 Clicks" Daily Activity     Outcome Measure   Help from another person eating meals?: None Help from another person taking care of personal grooming?: A Lot Help from another person toileting, which includes using toliet, bedpan, or urinal?: A Lot Help from another person bathing (including washing, rinsing, drying)?: A Lot Help from another person to put on and taking off regular upper body clothing?: A Little Help from another person to put on and taking off regular lower body clothing?: A Lot 6 Click Score: 15    End of Session    OT Visit Diagnosis: Other abnormalities of gait and mobility (R26.89);History of falling (Z91.81)   Activity Tolerance Patient tolerated treatment well   Patient Left in bed;with call bell/phone within reach;with bed alarm set   Nurse Communication          Time: 1610-9604 OT Time Calculation (min): 10 min  Charges: OT General Charges $OT Visit: 1 Visit OT Treatments $Therapeutic Exercise: 8-22 mins  Dessie Coma, M.S. OTR/L  06/15/21, 4:31 PM  ascom [phone]

## 2021-06-15 NOTE — Care Management Important Message (Signed)
Important Message  Patient Details  Name: Chris Davis MRN: [id number] Date of Birth: [birth year]   Medicare Important Message Given:  Yes     Dannette Barbara 06/15/2021, 3:31 PM

## 2021-06-15 NOTE — Progress Notes (Signed)
[facility]    SUBJECTIVE: Patient states that his shortness of breath is much improved he does complain of generalized weakness and slightly weaker on the right arm versus the left.  He has generalized weakness and is not able to ambulate as well as he would like but in general feels somewhat improved   Vitals:   06/14/21 2300 06/15/21 0019 06/15/21 0437 06/15/21 0732  BP: 134/78 101/69 124/75 (!) 103/56  Pulse: 97 89 93 79  Resp:  (!) 21 18 20   Temp:   98.6 F (37 C) 98.2 F (36.8 C)  TempSrc:      SpO2:  96% 95% 95%  Weight:      Height:         Intake/Output Summary (Last 24 hours) at 06/15/2021 0735 Last data filed at 06/14/2021 2255 Gross per 24 hour  Intake 480 ml  Output 1300 ml  Net -820 ml      PHYSICAL EXAM  General: Well developed, well nourished, in no acute distress HEENT:  Normocephalic and atramatic Neck:  No JVD.  Lungs: Clear bilaterally to auscultation and percussion. Heart: HRRR . Normal S1 and S2 without gallops or murmurs.  Abdomen: Bowel sounds are positive, abdomen soft and non-tender  Msk:  Back normal, normal gait. Normal strength and tone for age. Extremities: No clubbing, cyanosis or 3+edema.   Neuro: Alert and oriented X 3.  Generalized weakness right slightly weaker than the left Psych:  Good affect, responds appropriately   LABS: Basic Metabolic Panel: Recent Labs    06/13/21 0541 06/15/21 0524  NA 134* 134*  K 4.0 4.8  CL 105 107  CO2 21* 22  GLUCOSE 113* 102*  BUN 26* 29*  CREATININE 1.03 0.96  CALCIUM 8.3* 8.4*  MG 2.1 2.1   Liver Function Tests: No results for input(s): AST, ALT, ALKPHOS, BILITOT, PROT, ALBUMIN in the last 72 hours. No results for input(s): LIPASE, AMYLASE in the last 72 hours. CBC: Recent Labs    06/13/21 0541  WBC 6.9  NEUTROABS 4.9  HGB 13.7  HCT 40.8  MCV 89.9  PLT 220   Cardiac Enzymes: No results for input(s): CKTOTAL, CKMB, CKMBINDEX, TROPONINI in the last 72 hours. BNP: Invalid  input(s): POCBNP D-Dimer: No results for input(s): DDIMER in the last 72 hours. Hemoglobin A1C: No results for input(s): HGBA1C in the last 72 hours. Fasting Lipid Panel: No results for input(s): CHOL, HDL, LDLCALC, TRIG, CHOLHDL, LDLDIRECT in the last 72 hours. Thyroid Function Tests: No results for input(s): TSH, T4TOTAL, T3FREE, THYROIDAB in the last 72 hours.  Invalid input(s): FREET3 Anemia Panel: No results for input(s): VITAMINB12, FOLATE, FERRITIN, TIBC, IRON, RETICCTPCT in the last 72 hours.  CT HEAD WO CONTRAST  Result Date: 06/14/2021 CLINICAL DATA:  Acute neuro deficit. EXAM: CT HEAD WITHOUT CONTRAST TECHNIQUE: Contiguous axial images were obtained from the base of the skull through the vertex without intravenous contrast. COMPARISON:  CT head 06/11/2021 FINDINGS: Brain: Moderate atrophy.  Mild white matter hypodensity bilaterally. Negative for acute infarct, hemorrhage, mass Vascular: Negative for hyperdense vessel Skull: Negative Sinuses/Orbits: Retention cyst right maxillary sinus. Mild mucosal edema right ethmoid sinus. Remaining sinuses clear. Bilateral cataract extraction. Other: None IMPRESSION: No acute abnormality no change from 3 days ago. Moderate atrophy and mild chronic microvascular ischemic type changes in the white matter. Electronically Signed   By: Franchot Gallo M.D.   On: 06/14/2021 16:51     Echo previous echocardiogram with preserved left ventricular function of at  least 60%  TELEMETRY: Atrial fibrillation rate of around 105 nonspecific findings:  ASSESSMENT AND PLAN:  Principal Problem:   Acute on chronic systolic (congestive) heart failure (HCC) Active Problems:   HTN (hypertension)   Atrial fibrillation (HCC)   Hypotension   HLD (hyperlipidemia)   Peripheral edema   Generalized weakness   Falls   Acute hyponatremia    Plan Continue telemetry monitoring for atrial fibrillation Maintain diuretic therapy for heart failure Agree with leg  wrapping for lower extremity edema Discontinue Cardizem and switch to increased dose of Lopressor for blood pressure rate control and hypertension Continue Eliquis for anticoagulation metoprolol for rate Patient with right arm weakness unclear whether it is worse consider imaging for further assessment possible neurology Generalized weakness agree with physical therapy for strength training gait training balance Consider discontinuing statin therapy for 90 days for reassessment of generalized weakness Continue diabetes management with Jardiance consider Farxiga with his history of heart failure Recommend discontinuing Cardizem for rate and just using Lopressor because of the risk of Cardizem causing worsening lower extremity edema Consider outpatient follow-up with Dr. Alen Bleacher, MD 06/15/2021 7:35 AM

## 2021-06-15 NOTE — Consult Note (Signed)
[facility] Nurse Consult Note: Patient receiving care in [facility] 242.  Assisted by primary RN with positioning. Reason for Consult: "wound on bottom" Wound type: The only area I found was MASD-ITD to the intergluteal fold. Pressure Injury POA: Yes/No/NA Measurement: Wound bed: pin, superficial Drainage (amount, consistency, odor)  Periwound: intact Dressing procedure/placement/frequency: I have ordered twice daily application of Triple Paste to the area. Monitor the wound area(s) for worsening of condition such as: Signs/symptoms of infection,  Increase in size,  Development of or worsening of odor, Development of pain, or increased pain at the affected locations.  Notify the medical team if any of these develop.  Thank you for the consult.  Discussed plan of care with the bedside nurse.  [facility] nurse will not follow at this time.  Please re-consult the [facility] team if needed.  Val Riles, RN, MSN, CWOCN, CNS-BC, pager [phone]

## 2021-06-15 NOTE — Discharge Summary (Signed)
Physician Discharge Summary  Patient ID: Chris Davis MRN: [id number] DOB/AGE: 1932-06-22 87 y.o.  Admit date: 06/11/2021 Discharge date: 06/15/2021  Admission Diagnoses:  Discharge Diagnoses:  Principal Problem:   Acute on chronic systolic (congestive) heart failure (HCC) Active Problems:   HTN (hypertension)   Atrial fibrillation (HCC)   Hypotension   HLD (hyperlipidemia)   Peripheral edema   Generalized weakness   Falls   Acute hyponatremia   Discharged Condition: fair  Hospital Course:  Chris Davis is a 87 y.o. male with medical history significant for chronic systolic heart failure with most recent echocardiogram in July 2020 showing LVEF 40%, persistent atrial fibrillation chronically anticoagulated on Eliquis, hypertension, hyperlipidemia, who is admitted to [facility] on 06/11/2021 with acute on chronic systolic heart failure after presenting from home ([name]) to [facility] ED complaining of generalized weakness..  Was diagnosed with acute on chronic systolic congestive heart failure, he was giving IV diuretics, he developed significant hypotension.  He received IV fluid bolus.  He was put back on metoprolol on 6/29. His heart rate went up on 6/30 with atrial fibrillation.  He is given IV diltiazem, and digoxin  #1.  Hypotension. Acute on chronic systolic congestive heart failure. Persistent atrial fibrillation with rapid ventricular response. Upon arriving the hospital, patient had significant hypotension, he required IV bolus x2.  I discussed with Dr. Ubaldo Glassing yesterday and today, patient does not have any volume overload.  He was volume depleted.  At this point, I will hold off diuretics.  Due to hypotension, I also discontinued lisinopril, will continue metoprolol to 50 mg twice a day.  Patient also had a rapid ventricle response yesterday, but heart rate is better after added metoprolol. Echocardiogram performed yesterday showed hyperdynamic  left ventricle with ejection fraction 70 to 75%. Cortisol level 15.   #2.  Hyponatremia Improved.  3.  Generalized weakness. Patient was evaluated by PT/OT.  Patient was falling on left side with PT.  I personally spoke with PT, we obtained a CT head, did not show any acute stroke.  On my examination, patient does not seem to have any focal weakness.  Dr. Clayborn Bigness also mentioned the left arm weakness.  I did a thorough exam on the patient, got him up to the sitting position with the nurse, he has generalized weakness, but his muscle strength is symmetric, deep tendon reflexes are symmetric.  Does not have any cranial nerve abnormality.  No evidence of stroke. Patient will need intensive PT/OT.  However, patient still has severe weakness.  He will continue PT/OT in the nursing home.  Consults: cardiology  Significant Diagnostic Studies:  Echo: 1. Left ventricular ejection fraction, by estimation, is 70 to 75%. The left ventricle has hyperdynamic function. The left ventricle has no regional wall motion abnormalities. Left ventricular diastolic parameters were normal. 2. Right ventricular systolic function is mildly reduced. The right ventricular size is mildly enlarged. Mildly increased right ventricular wall thickness. 3. The mitral valve is normal in structure. Mild mitral valve regurgitation. 4. The aortic valve is normal in structure. Aortic valve regurgitation is not visualized. Mild aortic valve sclerosis is present, with no evidence of aortic valve stenosis.  CT HEAD WITHOUT CONTRAST   TECHNIQUE: Contiguous axial images were obtained from the base of the skull through the vertex without intravenous contrast.   COMPARISON:  CT head 06/11/2021   FINDINGS: Brain: Moderate atrophy.  Mild white matter hypodensity bilaterally.   Negative for acute infarct, hemorrhage, mass  Vascular: Negative for hyperdense vessel   Skull: Negative   Sinuses/Orbits: Retention cyst right  maxillary sinus. Mild mucosal edema right ethmoid sinus. Remaining sinuses clear. Bilateral cataract extraction.   Other: None   IMPRESSION: No acute abnormality no change from 3 days ago.   Moderate atrophy and mild chronic microvascular ischemic type changes in the white matter.     Electronically Signed   By: Franchot Gallo M.D.   On: 06/14/2021 16:51 Treatments: IV bolus, metoprolol  Discharge Exam: Blood pressure 118/71, pulse 78, temperature 98.4 F (36.9 C), temperature source Oral, resp. rate 20, height 5\' 8"  (1.727 m), weight 84.7 kg, SpO2 93 %. General appearance: alert and cooperative Resp: clear to auscultation bilaterally Cardio: irregular, no murmurs GI: soft, non-tender; bowel sounds normal; no masses,  no organomegaly Extremities: chronic 1+ edema  Disposition: Discharge disposition: 03-Skilled Nursing Facility       Discharge Instructions     Diet - low sodium heart healthy   Complete by: As directed    Increase activity slowly   Complete by: As directed       Allergies as of 06/15/2021   No Known Allergies      Medication List     STOP taking these medications    carvedilol 3.125 MG tablet Commonly known as: COREG   lisinopril 2.5 MG tablet Commonly known as: ZESTRIL   spironolactone 25 MG tablet Commonly known as: ALDACTONE   torsemide 20 MG tablet Commonly known as: DEMADEX       TAKE these medications    acetaminophen 500 MG tablet Commonly known as: TYLENOL Take 500 mg by mouth every 6 (six) hours as needed (for pain/headaches.).   apixaban 5 MG Tabs tablet Commonly known as: Eliquis Take 1 tablet (5 mg total) by mouth 2 (two) times daily.   atorvastatin 20 MG tablet Commonly known as: LIPITOR Take 20 mg by mouth every evening.   Cranberry 500 MG Caps Take 500 mg by mouth daily.   empagliflozin 10 MG Tabs tablet Commonly known as: JARDIANCE Take 10 mg by mouth daily with breakfast.   metoprolol tartrate 50 MG  tablet Commonly known as: LOPRESSOR Take 1 tablet (50 mg total) by mouth 2 (two) times daily.   multivitamin with minerals tablet Take 1 tablet by mouth daily.        Follow-up Information     Derinda Late, MD Follow up in 1 week(s).   Specialty: Family Medicine Contact information: [address] [facility] [place] [address] [phone]         Corey Skains, MD Follow up in 2 week(s).   Specialty: Cardiology Contact information: [address] [place] [place] [address] [phone]                35 minutes Signed: Sharen Hones 06/15/2021, 1:38 PM

## 2021-06-15 NOTE — Progress Notes (Signed)
Patient is not comfortable to be discharged today.  He also seems to be weaker than before.  But does not have any focal abnormality.  I decided to keep patient 1 more night because of this.

## 2021-06-16 LAB — BASIC METABOLIC PANEL
Anion gap: 5 (ref 5–15)
BUN: 34 mg/dL — ABNORMAL HIGH (ref 8–23)
CO2: 23 mmol/L (ref 22–32)
Calcium: 8.4 mg/dL — ABNORMAL LOW (ref 8.9–10.3)
Chloride: 105 mmol/L (ref 98–111)
Creatinine, Ser: 1.1 mg/dL (ref 0.61–1.24)
GFR, Estimated: >60 mL/min (ref >60)
Glucose, Bld: 121 mg/dL — ABNORMAL HIGH (ref 70–99)
Potassium: 4.7 mmol/L (ref 3.5–5.1)
Sodium: 133 mmol/L — ABNORMAL LOW (ref 135–145)

## 2021-06-16 LAB — CBC
HCT: 39.7 % (ref 39.0–52.0)
Hemoglobin: 13.1 g/dL (ref 13.0–17.0)
MCH: 29.8 pg (ref 26.0–34.0)
MCHC: 33 g/dL (ref 30.0–36.0)
MCV: 90.4 fL (ref 80.0–100.0)
Platelets: 240 10*3/uL (ref 150–400)
RBC: 4.39 MIL/uL (ref 4.22–5.81)
RDW: 15.5 % (ref 11.5–15.5)
WBC: 9.6 10*3/uL (ref 4.0–10.5)
nRBC: 0 % (ref 0.0–0.2)

## 2021-06-16 LAB — MAGNESIUM: Magnesium: 2.1 mg/dL (ref 1.7–2.4)

## 2021-06-16 MED ORDER — FUROSEMIDE 10 MG/ML IJ SOLN
4.0000 mg/h | INTRAVENOUS | Status: DC
  Administered 2021-06-16: 4 mg/h via INTRAVENOUS
  Filled 2021-06-16 (×2): qty 20

## 2021-06-16 NOTE — TOC Progression Note (Signed)
Transition of Care [facility]) - Progression Note    Patient Details  Name: Chris Davis MRN: [id number] Date of Birth: Jun 08, 1932  Transition [facility]) CM/SW [place], [place] Phone Number: 06/16/2021, 9:58 AM  Clinical Narrative:     Per MD patient not medically ready to dc to [facility] today, pending medical readiness potentially Monday 7/4. CSW has updated [facility] with this information.     Expected Discharge Plan: [place] Barriers to Discharge: No Barriers Identified  Expected Discharge Plan and Services Expected Discharge Plan: [place] Choice: [place] Living arrangements for the past 2 months: [facility]) Expected Discharge Date: 06/15/21                                     Social Determinants of Health (SDOH) Interventions    Readmission Risk Interventions No flowsheet data found.

## 2021-06-16 NOTE — Progress Notes (Signed)
   06/16/21 1050  Clinical Encounter Type  Visited With Patient  Visit Type Follow-up  Referral From Other (Comment) (rounding)  Spiritual Encounters  Spiritual Needs Other (Comment) (hospitality)  Chaplain Burris followed-up with Dr. Florene Glen who was seated in chair eating breakfast. Dr. Florene Glen asked for some milk and chaplain assisted. Chaplain Burris provided social support; no further needs assessed at this time.

## 2021-06-16 NOTE — Progress Notes (Addendum)
Patient Name: Chris Davis Date of Encounter: 06/16/2021  Hospital Problem List     Principal Problem:   Acute on chronic systolic (congestive) heart failure (HCC) Active Problems:   HTN (hypertension)   Atrial fibrillation (HCC)   Hypotension   HLD (hyperlipidemia)   Peripheral edema   Generalized weakness   Falls   Acute hyponatremia    Patient Profile     87 year old male with a history of chronic systolic heart failure with an echocardiogram in July 2020 showing an EF of 40% with persistent atrial fibrillation which has been anticoagulated with Eliquis along with hypertension and hyperlipidemia.  He presented to the emergency room from his care facility complaining generalized fatigue and weakness.  Chest x-ray revealed lung volumes and atelectasis.  There were septal thickening and vascular congestion.  Initially was treated with diuretics however developed hypotension and his diuretics were stopped and he was given fluid resuscitation.  He was admitted on 06/11/2021.  Brain CT revealed generalized cerebral atrophy with no acute intracranial abnormality.  Laboratories on admission revealed a sodium of 133 with a creatinine of 1.08.  BNP was 386.7.  Is of note that 6 months ago was 432.3.  He was also very dizzy on presentation and this improved with fluid resuscitation.  He was taken off he has antihypertensive medications which included beta-blocker, ACE inhibitor and diuretics.  Clinically was not felt to be volume overloaded.  He was in persistent atrial fibrillation and Eliquis was continued.  He was restarted on a lower dose beta-blocker with metoprolol 25 twice daily yesterday.  This a.m. he developed more rapid A. fib.  He was given IV diltiazem and digoxin.  A bolus of IV diltiazem and digoxin and previous rate transiently but he has subsequently developed further rapid ventricular response.    Subjective   Somewhat confused but able to answer questions appropriately.  Denies  shortness of breath or chest pain.  Inpatient Medications     apixaban  5 mg Oral BID   empagliflozin  10 mg Oral Q breakfast   Gerhardt's butt cream   Topical BID   loratadine  10 mg Oral Daily   metoprolol tartrate  50 mg Oral BID   multivitamin with minerals  1 tablet Oral Daily    Vital Signs    Vitals:   06/15/21 2035 06/16/21 0030 06/16/21 0321 06/16/21 0824  BP: 112/64 109/63 107/64 111/69  Pulse:  98 99 (!) 101  Resp:  (!) 23 (!) 25 20  Temp:  99.5 F (37.5 C) 98.5 F (36.9 C) (!) 97.4 F (36.3 C)  TempSrc:  Oral Oral Axillary  SpO2:  95% 95% 96%  Weight:   85.1 kg   Height:        Intake/Output Summary (Last 24 hours) at 06/16/2021 1000 Last data filed at 06/16/2021 0321 Gross per 24 hour  Intake 240 ml  Output 2400 ml  Net -2160 ml   Filed Weights   06/13/21 0458 06/14/21 0500 06/16/21 0321  Weight: 85.5 kg 84.7 kg 85.1 kg    Physical Exam    GEN: Well nourished, well developed, in no acute distress.  HEENT: normal.  Neck: Supple, no JVD, carotid bruits, or masses. Cardiac: Regular Respiratory: Crackles. GI: Soft, nontender, nondistended, BS + x 4. MS: no deformity or atrophy. Skin: warm and dry, no rash. Neuro:  Strength and sensation are intact. Psych: Normal affect.  Labs    CBC Recent Labs    06/16/21 0410  WBC  9.6  HGB 13.1  HCT 39.7  MCV 90.4  PLT 703   Basic Metabolic Panel Recent Labs    06/15/21 0524 06/16/21 0410  NA 134* 133*  K 4.8 4.7  CL 107 105  CO2 22 23  GLUCOSE 102* 121*  BUN 29* 34*  CREATININE 0.96 1.10  CALCIUM 8.4* 8.4*  MG 2.1 2.1   Liver Function Tests No results for input(s): AST, ALT, ALKPHOS, BILITOT, PROT, ALBUMIN in the last 72 hours. No results for input(s): LIPASE, AMYLASE in the last 72 hours. Cardiac Enzymes No results for input(s): CKTOTAL, CKMB, CKMBINDEX, TROPONINI in the last 72 hours. BNP No results for input(s): BNP in the last 72 hours. D-Dimer No results for input(s): DDIMER in the  last 72 hours. Hemoglobin A1C No results for input(s): HGBA1C in the last 72 hours. Fasting Lipid Panel No results for input(s): CHOL, HDL, LDLCALC, TRIG, CHOLHDL, LDLDIRECT in the last 72 hours. Thyroid Function Tests No results for input(s): TSH, T4TOTAL, T3FREE, THYROIDAB in the last 72 hours.  Invalid input(s): FREET3  Telemetry    Atrial fibrillation with variable but somewhat rapid ventricular response  ECG    A. fib with rapid response.  Radiology    DG Chest 2 View  Result Date: 06/11/2021 CLINICAL DATA:  Multiple falls, weakness EXAM: CHEST - 2 VIEW COMPARISON:  Radiograph 07/21/2017 FINDINGS: Chronically coarsened interstitial and bronchitic changes. Some peripheral septal (Kerley B) line are noted in the lung periphery with pulmonary vascular congestion and cephalization. Cardiomegaly is similar to comparison prior. The aorta is calcified. The remaining cardiomediastinal contours are unremarkable. No pneumothorax. The osseous structures appear diffusely demineralized which may limit detection of small or nondisplaced fractures. No acute osseous abnormality or suspicious osseous lesion. Telemetry leads overlie the chest. Chest wall soft tissues are unremarkable. Bowel gas pattern is nonobstructive IMPRESSION: Low volumes and atelectasis. Septal thickening and vascular congestion may suggest developing interstitial edema. Trace bilateral effusions suspected as well. Cardiomegaly and Aortic Atherosclerosis (ICD10-I70.0). Electronically Signed   By: Lovena Le M.D.   On: 06/11/2021 20:21   CT HEAD WO CONTRAST  Result Date: 06/14/2021 CLINICAL DATA:  Acute neuro deficit. EXAM: CT HEAD WITHOUT CONTRAST TECHNIQUE: Contiguous axial images were obtained from the base of the skull through the vertex without intravenous contrast. COMPARISON:  CT head 06/11/2021 FINDINGS: Brain: Moderate atrophy.  Mild white matter hypodensity bilaterally. Negative for acute infarct, hemorrhage, mass  Vascular: Negative for hyperdense vessel Skull: Negative Sinuses/Orbits: Retention cyst right maxillary sinus. Mild mucosal edema right ethmoid sinus. Remaining sinuses clear. Bilateral cataract extraction. Other: None IMPRESSION: No acute abnormality no change from 3 days ago. Moderate atrophy and mild chronic microvascular ischemic type changes in the white matter. Electronically Signed   By: Franchot Gallo M.D.   On: 06/14/2021 16:51   CT Head Wo Contrast  Result Date: 06/11/2021 CLINICAL DATA:  Status post multiple falls. EXAM: CT HEAD WITHOUT CONTRAST TECHNIQUE: Contiguous axial images were obtained from the base of the skull through the vertex without intravenous contrast. COMPARISON:  November 02, 2020 FINDINGS: Brain: There is mild cerebral atrophy with widening of the extra-axial spaces and ventricular dilatation. There are areas of decreased attenuation within the white matter tracts of the supratentorial brain, consistent with microvascular disease changes. Vascular: No hyperdense vessel or unexpected calcification. Skull: Normal. Negative for fracture or focal lesion. Sinuses/Orbits: A 1.8 cm x 1.3 cm right maxillary sinus polyp versus mucous retention cyst is seen. Other: None. IMPRESSION: 1. Generalized cerebral  atrophy. 2. No acute intracranial abnormality. Electronically Signed   By: Virgina Norfolk M.D.   On: 06/11/2021 20:26   ECHOCARDIOGRAM COMPLETE  Result Date: 06/12/2021    ECHOCARDIOGRAM REPORT   Patient Name:   RAVON MORTELLARO Date of Exam: 06/12/2021 Medical Rec #:  [id number]   Height:       68.0 in Accession #:    [id number]  Weight:       182.2 lb Date of Birth:  January 04, 1932   BSA:          1.964 m Patient Age:    54 years    BP:           13/48 mmHg Patient Gender: M           HR:           73 bpm. Exam Location:  [facility] Procedure: 2D Echo, Cardiac Doppler and Color Doppler Indications:     CHF-acute systolic M01.02  History:         Patient has no prior history of Echocardiogram  examinations.                  Arrythmias:Atrial Fibrillation; Risk Factors:Hypertension.  Sonographer:     Sherrie Sport RDCS (AE) Referring Phys:  [id number] Rhetta Mura Diagnosing Phys: Yolonda Kida MD  Sonographer Comments: No apical window and no subcostal window. IMPRESSIONS  1. Left ventricular ejection fraction, by estimation, is 70 to 75%. The left ventricle has hyperdynamic function. The left ventricle has no regional wall motion abnormalities. Left ventricular diastolic parameters were normal.  2. Right ventricular systolic function is mildly reduced. The right ventricular size is mildly enlarged. Mildly increased right ventricular wall thickness.  3. The mitral valve is normal in structure. Mild mitral valve regurgitation.  4. The aortic valve is normal in structure. Aortic valve regurgitation is not visualized. Mild aortic valve sclerosis is present, with no evidence of aortic valve stenosis. FINDINGS  Left Ventricle: Left ventricular ejection fraction, by estimation, is 70 to 75%. The left ventricle has hyperdynamic function. The left ventricle has no regional wall motion abnormalities. The left ventricular internal cavity size was normal in size. There is no left ventricular hypertrophy. Left ventricular diastolic parameters were normal. Right Ventricle: The right ventricular size is mildly enlarged. Mildly increased right ventricular wall thickness. Right ventricular systolic function is mildly reduced. Left Atrium: Left atrial size was normal in size. Right Atrium: Right atrial size was normal in size. Pericardium: There is no evidence of pericardial effusion. Mitral Valve: The mitral valve is normal in structure. Mild mitral valve regurgitation. Tricuspid Valve: The tricuspid valve is normal in structure. Tricuspid valve regurgitation is trivial. Aortic Valve: The aortic valve is normal in structure. Aortic valve regurgitation is not visualized. Mild aortic valve sclerosis is present, with no  evidence of aortic valve stenosis. Pulmonic Valve: The pulmonic valve was normal in structure. Pulmonic valve regurgitation is not visualized. Aorta: The ascending aorta was not well visualized. IAS/Shunts: No atrial level shunt detected by color flow Doppler.  LEFT VENTRICLE PLAX 2D LVIDd:         3.94 cm LVIDs:         2.19 cm LV PW:         0.88 cm LV IVS:        0.94 cm LVOT diam:     2.10 cm LVOT Area:     3.46 cm  LEFT ATRIUM  Index LA diam:    5.10 cm 2.60 cm/m                        PULMONIC VALVE AORTA                 PV Vmax:        0.60 m/s Ao Root diam: 3.70 cm PV Peak grad:   1.4 mmHg                       RVOT Peak grad: 4 mmHg   SHUNTS Systemic Diam: 2.10 cm Yolonda Kida MD Electronically signed by Yolonda Kida MD Signature Date/Time: 06/12/2021/4:54:02 PM    Final    DG Hip Unilat W or Wo Pelvis 2-3 Views Right  Result Date: 06/11/2021 CLINICAL DATA:  87 year old male with fall and right hip pain. EXAM: DG HIP (WITH OR WITHOUT PELVIS) 2-3V RIGHT COMPARISON:  None. FINDINGS: There is no acute fracture or dislocation. The bones are osteopenic. Mild bilateral hip arthritic changes. There is slight cortical prominence of the lateral femoral heads which may predispose to cam type femoroacetabular impingement. The soft tissues are unremarkable. There acute therapy seeds of the prostate gland noted. IMPRESSION: 1. No acute fracture or dislocation. 2. Mild bilateral hip arthritic changes. Electronically Signed   By: Anner Crete M.D.   On: 06/11/2021 20:44    Assessment & Plan     1.  CHF-appears to have volume overload.  Will start on furosemide drip at 4 mg/hr.  Would continue with metoprolol at 25 twice daily and will continue with Jardiance.  We will need to follow electrolytes, hemodynamics and renal function.  Echocardiogram revealed normal LV function.  This appears to be diastolic dysfunction.  2.  A. fib with RVR-we will attempt to control rate with metoprolol  tartrate 50 mg twice daily.  May need to use intermittent doses of IV or p.o. digoxin if rate continues to be a problem and blood pressure does not allow more aggressive beta or calcium blocker therapy.  Would continue with Eliquis at 5 mg twice daily following renal function and for evidence of bleeding.  3.  Altered mental status-this is the first time I have spoken with him.  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  Head CT done earlier this admission showed mild to moderate atrophy with no acute changes.  This is unchanged to the CT done on admission.  Signed, Javier Docker Keta Vanvalkenburgh MD 06/16/2021, 10:00 AM  Pager: [phone]

## 2021-06-16 NOTE — Progress Notes (Signed)
PROGRESS NOTE    Chris Davis  AJO:[id number] DOB: [birth year] DOA: 06/11/2021 PCP: Derinda Late, MD   Chief complaint.  Generalized weakness. Brief Narrative:  Chris Davis is a 87 y.o. male with medical history significant for chronic systolic heart failure with most recent echocardiogram in July 2020 showing LVEF 40%, persistent atrial fibrillation chronically anticoagulated on Eliquis, hypertension, hyperlipidemia, who is admitted to [facility] on 06/11/2021 with acute on chronic systolic heart failure after presenting from home [facility] assisted living) to [facility] ED complaining of generalized weakness..  Was diagnosed with acute on chronic systolic congestive heart failure, he was giving IV diuretics, he developed significant hypotension.  He received IV fluid bolus.  He was put back on metoprolol on 6/29. His heart rate went up on 6/30 with atrial fibrillation.  He is given IV diltiazem, and digoxin. 7/2.  Blood pressure is more stable, he shows evidence of volume overload now.  IV Lasix drip started.   Assessment & Plan:   Principal Problem:   Acute on chronic systolic (congestive) heart failure (HCC) Active Problems:   HTN (hypertension)   Atrial fibrillation (HCC)   Hypotension   HLD (hyperlipidemia)   Peripheral edema   Generalized weakness   Falls   Acute hyponatremia  #1.  Hypotension. Acute on chronic systolic congestive heart failure. Persistent atrial fibrillation with rapid ventricular response. Patient blood pressure more stable now.  Heart rate is controlled with 50 mg metoprolol twice a day. Discussed with Dr. Ubaldo Glassing, patient seem to be showing more edema, poor appetite, and increased weakness.  This is consistent with volume overload, due to low blood pressure, we started lower dose furosemide drip.  We will follow blood pressure closely, will give IV albumin if needed. Continue Eliquis for stroke prevention  #2.   Hyponatremia Improved.  3.  Generalized weakness. No evidence of stroke.  Patient condition appears to be multifactorial, including fast heart rate, now with mild volume overload.  We will continue PT/OT.  Patient has a bed in the nursing home, most likely will transfer on Monday.    DVT prophylaxis: Eliquis Code Status: DNR Family Communication:  Disposition Plan:    Status is: Inpatient  Remains inpatient appropriate because:Inpatient level of care appropriate due to severity of illness  Dispo: The patient is from: Home              Anticipated d/c is to: SNF              Patient currently is not medically stable to d/c.   Difficult to place patient No        I/O last 3 completed shifts: In: 240 [P.O.:240] Out: 3100 [Urine:3100] Total I/O In: -  Out: 8 [Urine:600]     Consultants:  Card  Procedures: None  Antimicrobials: None   Subjective: Patient still has significant weakness, he needed help to sit up. He did not feel short of breath, but he could not lay flat.  He has increased leg edema. No chest pain or palpitation. No abdominal pain nausea vomiting. No fever or chills. No dysuria or hematuria.  Objective: Vitals:   06/16/21 0030 06/16/21 0321 06/16/21 0824 06/16/21 1140  BP: 109/63 107/64 111/69 99/62  Pulse: 98 99 (!) 101 84  Resp: (!) 23 (!) 25 20 (!) 21  Temp: 99.5 F (37.5 C) 98.5 F (36.9 C) (!) 97.4 F (36.3 C) 97.7 F (36.5 C)  TempSrc: Oral Oral Axillary Oral  SpO2: 95% 95% 96% 94%  Weight:  85.1 kg    Height:        Intake/Output Summary (Last 24 hours) at 06/16/2021 1157 Last data filed at 06/16/2021 1103 Gross per 24 hour  Intake --  Output 2400 ml  Net -2400 ml   Filed Weights   06/13/21 0458 06/14/21 0500 06/16/21 0321  Weight: 85.5 kg 84.7 kg 85.1 kg    Examination:  General exam: Appears calm and comfortable  Respiratory system: Clear to auscultation. Respiratory effort normal. Cardiovascular system:  Irregularly irregular.  No JVD, murmurs, rubs, gallops or clicks. No pedal edema. Gastrointestinal system: Abdomen is nondistended, soft and nontender. No organomegaly or masses felt. Normal bowel sounds heard. Central nervous system: Alert and oriented. No focal neurological deficits. Extremities: 2+ leg edema. Skin: No rashes, lesions or ulcers Psychiatry: Judgement and insight appear normal. Mood & affect appropriate.     Data Reviewed: I have personally reviewed following labs and imaging studies  CBC: Recent Labs  Lab 06/11/21 2003 06/12/21 0657 06/13/21 0541 06/16/21 0410  WBC 7.8 6.2 6.9 9.6  NEUTROABS 5.7  --  4.9  --   HGB 14.7 13.4 13.7 13.1  HCT 44.7 39.7 40.8 39.7  MCV 90.1 89.6 89.9 90.4  PLT 238 209 220 474   Basic Metabolic Panel: Recent Labs  Lab 06/11/21 2003 06/12/21 0657 06/13/21 0541 06/15/21 0524 06/16/21 0410  NA 133* 137 134* 134* 133*  K 4.8 4.1 4.0 4.8 4.7  CL 104 104 105 107 105  CO2 23 25 21* 22 23  GLUCOSE 87 88 113* 102* 121*  BUN 30* 26* 26* 29* 34*  CREATININE 1.08 1.05 1.03 0.96 1.10  CALCIUM 8.8* 8.5* 8.3* 8.4* 8.4*  MG 2.3 2.2 2.1 2.1 2.1  PHOS  --  3.3  --   --   --    GFR: Estimated Creatinine Clearance: 51.2 mL/min (by C-G formula based on SCr of 1.1 mg/dL). Liver Function Tests: Recent Labs  Lab 06/11/21 2003 06/12/21 0657  AST 25 19  ALT 17 13  ALKPHOS 91 77  BILITOT 1.4* 1.6*  PROT 6.8 5.9*  ALBUMIN 3.7 3.0*   No results for input(s): LIPASE, AMYLASE in the last 168 hours. No results for input(s): AMMONIA in the last 168 hours. Coagulation Profile: No results for input(s): INR, PROTIME in the last 168 hours. Cardiac Enzymes: No results for input(s): CKTOTAL, CKMB, CKMBINDEX, TROPONINI in the last 168 hours. BNP (last 3 results) No results for input(s): PROBNP in the last 8760 hours. HbA1C: No results for input(s): HGBA1C in the last 72 hours. CBG: No results for input(s): GLUCAP in the last 168 hours. Lipid  Profile: No results for input(s): CHOL, HDL, LDLCALC, TRIG, CHOLHDL, LDLDIRECT in the last 72 hours. Thyroid Function Tests: No results for input(s): TSH, T4TOTAL, FREET4, T3FREE, THYROIDAB in the last 72 hours. Anemia Panel: No results for input(s): VITAMINB12, FOLATE, FERRITIN, TIBC, IRON, RETICCTPCT in the last 72 hours. Sepsis Labs: Recent Labs  Lab 06/12/21 0657  PROCALCITON <0.10    Recent Results (from the past 240 hour(s))  Resp Panel by RT-PCR (Flu A&B, Covid) Nasopharyngeal Swab     Status: None   Collection Time: 06/11/21  8:03 PM   Specimen: Nasopharyngeal Swab; Nasopharyngeal(NP) swabs in vial transport medium  Result Value Ref Range Status   SARS Coronavirus 2 by RT PCR NEGATIVE NEGATIVE Final    Comment: (NOTE) SARS-CoV-2 target nucleic acids are NOT DETECTED.  The SARS-CoV-2 RNA is generally detectable in upper respiratory specimens  during the acute phase of infection. The lowest concentration of SARS-CoV-2 viral copies this assay can detect is 138 copies/mL. A negative result does not preclude SARS-Cov-2 infection and should not be used as the sole basis for treatment or other patient management decisions. A negative result may occur with  improper specimen collection/handling, submission of specimen other than nasopharyngeal swab, presence of viral mutation(s) within the areas targeted by this assay, and inadequate number of viral copies(<138 copies/mL). A negative result must be combined with clinical observations, patient history, and epidemiological information. The expected result is Negative.  Fact Sheet for Patients:  EntrepreneurPulse.com.au  Fact Sheet for Healthcare Providers:  IncredibleEmployment.be  This test is no t yet approved or cleared by the [place] FDA and  has been authorized for detection and/or diagnosis of SARS-CoV-2 by FDA under an Emergency Use Authorization (EUA). This EUA will remain  in  effect (meaning this test can be used) for the duration of the COVID-19 declaration under Section 564(b)(1) of the Act, 21 U.S.C.section 360bbb-3(b)(1), unless the authorization is terminated  or revoked sooner.       Influenza A by PCR NEGATIVE NEGATIVE Final   Influenza B by PCR NEGATIVE NEGATIVE Final    Comment: (NOTE) The Xpert Xpress SARS-CoV-2/FLU/RSV plus assay is intended as an aid in the diagnosis of influenza from Nasopharyngeal swab specimens and should not be used as a sole basis for treatment. Nasal washings and aspirates are unacceptable for Xpert Xpress SARS-CoV-2/FLU/RSV testing.  Fact Sheet for Patients: EntrepreneurPulse.com.au  Fact Sheet for Healthcare Providers: IncredibleEmployment.be  This test is not yet approved or cleared by the [place] FDA and has been authorized for detection and/or diagnosis of SARS-CoV-2 by FDA under an Emergency Use Authorization (EUA). This EUA will remain in effect (meaning this test can be used) for the duration of the COVID-19 declaration under Section 564(b)(1) of the Act, 21 U.S.C. section 360bbb-3(b)(1), unless the authorization is terminated or revoked.  Performed at [facility], [address] [place], [place] [address]          Radiology Studies: CT HEAD WO CONTRAST  Result Date: 06/14/2021 CLINICAL DATA:  Acute neuro deficit. EXAM: CT HEAD WITHOUT CONTRAST TECHNIQUE: Contiguous axial images were obtained from the base of the skull through the vertex without intravenous contrast. COMPARISON:  CT head 06/11/2021 FINDINGS: Brain: Moderate atrophy.  Mild white matter hypodensity bilaterally. Negative for acute infarct, hemorrhage, mass Vascular: Negative for hyperdense vessel Skull: Negative Sinuses/Orbits: Retention cyst right maxillary sinus. Mild mucosal edema right ethmoid sinus. Remaining sinuses clear. Bilateral cataract extraction. Other: None IMPRESSION: No  acute abnormality no change from 3 days ago. Moderate atrophy and mild chronic microvascular ischemic type changes in the white matter. Electronically Signed   By: Franchot Gallo M.D.   On: 06/14/2021 16:51        Scheduled Meds:  apixaban  5 mg Oral BID   empagliflozin  10 mg Oral Q breakfast   Gerhardt's butt cream   Topical BID   loratadine  10 mg Oral Daily   metoprolol tartrate  50 mg Oral BID   multivitamin with minerals  1 tablet Oral Daily   Continuous Infusions:  furosemide (LASIX) 200 mg in dextrose 5% 100 mL (2mg /mL) infusion       LOS: 4 days    Time spent: 32 minutes    Sharen Hones, MD [facility]   To contact the attending provider between 7A-7P or the covering provider during after hours  7P-7A, please log into the web site [email or web address] and access using universal Brenda password for that web site. If you do not have the password, please call the hospital operator.  06/16/2021, 11:57 AM

## 2021-06-17 LAB — BASIC METABOLIC PANEL
Anion gap: 7 (ref 5–15)
BUN: 45 mg/dL — ABNORMAL HIGH (ref 8–23)
CO2: 23 mmol/L (ref 22–32)
Calcium: 8.3 mg/dL — ABNORMAL LOW (ref 8.9–10.3)
Chloride: 105 mmol/L (ref 98–111)
Creatinine, Ser: 0.95 mg/dL (ref 0.61–1.24)
GFR, Estimated: >60 mL/min (ref >60)
Glucose, Bld: 88 mg/dL (ref 70–99)
Potassium: 3.8 mmol/L (ref 3.5–5.1)
Sodium: 135 mmol/L (ref 135–145)

## 2021-06-17 LAB — MAGNESIUM: Magnesium: 2.1 mg/dL (ref 1.7–2.4)

## 2021-06-17 MED ORDER — ALBUMIN HUMAN 25 % IV SOLN
25.0000 g | Freq: Once | INTRAVENOUS | Status: AC
  Administered 2021-06-17: 25 g via INTRAVENOUS
  Filled 2021-06-17: qty 100

## 2021-06-17 NOTE — Progress Notes (Signed)
Patient Name: Chris Davis Date of Encounter: 06/17/2021  Hospital Problem List     Principal Problem:   Acute on chronic systolic (congestive) heart failure (HCC) Active Problems:   HTN (hypertension)   Atrial fibrillation (HCC)   Hypotension   HLD (hyperlipidemia)   Peripheral edema   Generalized weakness   Falls   Acute hyponatremia    Patient Profile        87 year old male with a history of chronic systolic heart failure with an echocardiogram in July 2020 showing an EF of 40% with persistent atrial fibrillation which has been anticoagulated with Eliquis along with hypertension and hyperlipidemia.  He presented to the emergency room from his care facility complaining generalized fatigue and weakness.  Chest x-ray revealed lung volumes and atelectasis.  There were septal thickening and vascular congestion.  Initially was treated with diuretics however developed hypotension and his diuretics were stopped and he was given fluid resuscitation.  He was admitted on 06/11/2021.  Brain CT revealed generalized cerebral atrophy with no acute intracranial abnormality.  Laboratories on admission revealed a sodium of 133 with a creatinine of 1.08.  BNP was 386.7.  Is of note that 6 months ago was 432.3.  He was also very dizzy on presentation and this improved with fluid resuscitation.  He was taken off he has antihypertensive medications which included beta-blocker, ACE inhibitor and diuretics.  Clinically was not felt to be volume overloaded.  He was in persistent atrial fibrillation and Eliquis was continued.  He was restarted on a lower dose beta-blocker with metoprolol 25 twice daily yesterday.  This a.m. he developed more rapid A. fib.  He was given IV diltiazem and digoxin.  A bolus of IV diltiazem and digoxin and previous rate transiently but he has subsequently developed further rapid ventricular response.    Subjective   Somewhat improved today.  More oriented.  Heart rate is in good  control.  Is diuresing well.  Inpatient Medications     apixaban  5 mg Oral BID   empagliflozin  10 mg Oral Q breakfast   Gerhardt's butt cream   Topical BID   loratadine  10 mg Oral Daily   metoprolol tartrate  50 mg Oral BID   multivitamin with minerals  1 tablet Oral Daily    Vital Signs    Vitals:   06/17/21 0539 06/17/21 0645 06/17/21 0651 06/17/21 0800  BP: (!) 145/72  (!) (P) 88/55 (!) 89/48  Pulse: 85   74  Resp: (!) 21  18 15   Temp: 97.8 F (36.6 C)     TempSrc:      SpO2: 97%  96% 97%  Weight:  82.4 kg    Height:        Intake/Output Summary (Last 24 hours) at 06/17/2021 0936 Last data filed at 06/17/2021 0700 Gross per 24 hour  Intake 1117.29 ml  Output 2600 ml  Net -1482.71 ml   Filed Weights   06/14/21 0500 06/16/21 0321 06/17/21 0645  Weight: 84.7 kg 85.1 kg 82.4 kg    Physical Exam     HEENT: normal.  Neck: Supple, no JVD, carotid bruits, or masses. Cardiac: Irregular regular Respiratory: Decreased breath sounds bilaterally GI: Soft, nontender, nondistended, BS + x 4. MS: no deformity or atrophy. Skin: warm and dry, no rash. Neuro:  Strength and sensation are intact. Psych: Normal affect.  Labs    CBC Recent Labs    06/16/21 0410  WBC 9.6  HGB 13.1  HCT  39.7  MCV 90.4  PLT 836   Basic Metabolic Panel Recent Labs    06/16/21 0410 06/17/21 0424  NA 133* 135  K 4.7 3.8  CL 105 105  CO2 23 23  GLUCOSE 121* 88  BUN 34* 45*  CREATININE 1.10 0.95  CALCIUM 8.4* 8.3*  MG 2.1 2.1   Liver Function Tests No results for input(s): AST, ALT, ALKPHOS, BILITOT, PROT, ALBUMIN in the last 72 hours. No results for input(s): LIPASE, AMYLASE in the last 72 hours. Cardiac Enzymes No results for input(s): CKTOTAL, CKMB, CKMBINDEX, TROPONINI in the last 72 hours. BNP No results for input(s): BNP in the last 72 hours. D-Dimer No results for input(s): DDIMER in the last 72 hours. Hemoglobin A1C No results for input(s): HGBA1C in the last 72  hours. Fasting Lipid Panel No results for input(s): CHOL, HDL, LDLCALC, TRIG, CHOLHDL, LDLDIRECT in the last 72 hours. Thyroid Function Tests No results for input(s): TSH, T4TOTAL, T3FREE, THYROIDAB in the last 72 hours.  Invalid input(s): FREET3  Telemetry    A. fib with controlled ventricular thoughts  ECG    Atrial fibrillation with rapid ventricular response  Radiology    DG Chest 2 View  Result Date: 06/11/2021 CLINICAL DATA:  Multiple falls, weakness EXAM: CHEST - 2 VIEW COMPARISON:  Radiograph 07/21/2017 FINDINGS: Chronically coarsened interstitial and bronchitic changes. Some peripheral septal (Kerley B) line are noted in the lung periphery with pulmonary vascular congestion and cephalization. Cardiomegaly is similar to comparison prior. The aorta is calcified. The remaining cardiomediastinal contours are unremarkable. No pneumothorax. The osseous structures appear diffusely demineralized which may limit detection of small or nondisplaced fractures. No acute osseous abnormality or suspicious osseous lesion. Telemetry leads overlie the chest. Chest wall soft tissues are unremarkable. Bowel gas pattern is nonobstructive IMPRESSION: Low volumes and atelectasis. Septal thickening and vascular congestion may suggest developing interstitial edema. Trace bilateral effusions suspected as well. Cardiomegaly and Aortic Atherosclerosis (ICD10-I70.0). Electronically Signed   By: Lovena Le M.D.   On: 06/11/2021 20:21   CT HEAD WO CONTRAST  Result Date: 06/14/2021 CLINICAL DATA:  Acute neuro deficit. EXAM: CT HEAD WITHOUT CONTRAST TECHNIQUE: Contiguous axial images were obtained from the base of the skull through the vertex without intravenous contrast. COMPARISON:  CT head 06/11/2021 FINDINGS: Brain: Moderate atrophy.  Mild white matter hypodensity bilaterally. Negative for acute infarct, hemorrhage, mass Vascular: Negative for hyperdense vessel Skull: Negative Sinuses/Orbits: Retention cyst  right maxillary sinus. Mild mucosal edema right ethmoid sinus. Remaining sinuses clear. Bilateral cataract extraction. Other: None IMPRESSION: No acute abnormality no change from 3 days ago. Moderate atrophy and mild chronic microvascular ischemic type changes in the white matter. Electronically Signed   By: Franchot Gallo M.D.   On: 06/14/2021 16:51   CT Head Wo Contrast  Result Date: 06/11/2021 CLINICAL DATA:  Status post multiple falls. EXAM: CT HEAD WITHOUT CONTRAST TECHNIQUE: Contiguous axial images were obtained from the base of the skull through the vertex without intravenous contrast. COMPARISON:  November 02, 2020 FINDINGS: Brain: There is mild cerebral atrophy with widening of the extra-axial spaces and ventricular dilatation. There are areas of decreased attenuation within the white matter tracts of the supratentorial brain, consistent with microvascular disease changes. Vascular: No hyperdense vessel or unexpected calcification. Skull: Normal. Negative for fracture or focal lesion. Sinuses/Orbits: A 1.8 cm x 1.3 cm right maxillary sinus polyp versus mucous retention cyst is seen. Other: None. IMPRESSION: 1. Generalized cerebral atrophy. 2. No acute intracranial abnormality. Electronically Signed  By: Virgina Norfolk M.D.   On: 06/11/2021 20:26   ECHOCARDIOGRAM COMPLETE  Result Date: 06/12/2021    ECHOCARDIOGRAM REPORT   Patient Name:   JONATHYN CAROTHERS Date of Exam: 06/12/2021 Medical Rec #:  [id number]   Height:       68.0 in Accession #:    [id number]  Weight:       182.2 lb Date of Birth:  1932-01-14   BSA:          1.964 m Patient Age:    87 years    BP:           13/48 mmHg Patient Gender: M           HR:           73 bpm. Exam Location:  [facility] Procedure: 2D Echo, Cardiac Doppler and Color Doppler Indications:     CHF-acute systolic U93.23  History:         Patient has no prior history of Echocardiogram examinations.                  Arrythmias:Atrial Fibrillation; Risk Factors:Hypertension.   Sonographer:     Sherrie Sport RDCS (AE) Referring Phys:  [id number] Rhetta Mura Diagnosing Phys: Yolonda Kida MD  Sonographer Comments: No apical window and no subcostal window. IMPRESSIONS  1. Left ventricular ejection fraction, by estimation, is 70 to 75%. The left ventricle has hyperdynamic function. The left ventricle has no regional wall motion abnormalities. Left ventricular diastolic parameters were normal.  2. Right ventricular systolic function is mildly reduced. The right ventricular size is mildly enlarged. Mildly increased right ventricular wall thickness.  3. The mitral valve is normal in structure. Mild mitral valve regurgitation.  4. The aortic valve is normal in structure. Aortic valve regurgitation is not visualized. Mild aortic valve sclerosis is present, with no evidence of aortic valve stenosis. FINDINGS  Left Ventricle: Left ventricular ejection fraction, by estimation, is 70 to 75%. The left ventricle has hyperdynamic function. The left ventricle has no regional wall motion abnormalities. The left ventricular internal cavity size was normal in size. There is no left ventricular hypertrophy. Left ventricular diastolic parameters were normal. Right Ventricle: The right ventricular size is mildly enlarged. Mildly increased right ventricular wall thickness. Right ventricular systolic function is mildly reduced. Left Atrium: Left atrial size was normal in size. Right Atrium: Right atrial size was normal in size. Pericardium: There is no evidence of pericardial effusion. Mitral Valve: The mitral valve is normal in structure. Mild mitral valve regurgitation. Tricuspid Valve: The tricuspid valve is normal in structure. Tricuspid valve regurgitation is trivial. Aortic Valve: The aortic valve is normal in structure. Aortic valve regurgitation is not visualized. Mild aortic valve sclerosis is present, with no evidence of aortic valve stenosis. Pulmonic Valve: The pulmonic valve was normal in  structure. Pulmonic valve regurgitation is not visualized. Aorta: The ascending aorta was not well visualized. IAS/Shunts: No atrial level shunt detected by color flow Doppler.  LEFT VENTRICLE PLAX 2D LVIDd:         3.94 cm LVIDs:         2.19 cm LV PW:         0.88 cm LV IVS:        0.94 cm LVOT diam:     2.10 cm LVOT Area:     3.46 cm  LEFT ATRIUM         Index LA diam:    5.10 cm 2.60 cm/m  PULMONIC VALVE AORTA                 PV Vmax:        0.60 m/s Ao Root diam: 3.70 cm PV Peak grad:   1.4 mmHg                       RVOT Peak grad: 4 mmHg   SHUNTS Systemic Diam: 2.10 cm Yolonda Kida MD Electronically signed by Yolonda Kida MD Signature Date/Time: 06/12/2021/4:54:02 PM    Final    DG Hip Unilat W or Wo Pelvis 2-3 Views Right  Result Date: 06/11/2021 CLINICAL DATA:  87 year old male with fall and right hip pain. EXAM: DG HIP (WITH OR WITHOUT PELVIS) 2-3V RIGHT COMPARISON:  None. FINDINGS: There is no acute fracture or dislocation. The bones are osteopenic. Mild bilateral hip arthritic changes. There is slight cortical prominence of the lateral femoral heads which may predispose to cam type femoroacetabular impingement. The soft tissues are unremarkable. There acute therapy seeds of the prostate gland noted. IMPRESSION: 1. No acute fracture or dislocation. 2. Mild bilateral hip arthritic changes. Electronically Signed   By: Anner Crete M.D.   On: 06/11/2021 20:44    Assessment & Plan     1.  CHF-appears to have volume overload.  Will continue with furosemide drip at 4 mg/hr. has diuresed approximately 4 L since admission.  Renal function stable.  Would continue with metoprolol at 25 twice daily and will continue with Jardiance.  We will need to follow electrolytes, hemodynamics and renal function.  Echocardiogram revealed normal LV function.  This appears to be diastolic dysfunction.  2.  A. fib with RVR-we will attempt to control rate with metoprolol tartrate 50  mg twice daily.  May need to use intermittent doses of IV or p.o. digoxin if rate continues to be a problem and blood pressure does not allow more aggressive beta or calcium blocker therapy.  Would continue with Eliquis at 5 mg twice daily following renal function and for evidence of bleeding.  3.  Altered mental status-this is the first time I have spoken with him.  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  Head CT done earlier this admission showed mild to moderate atrophy with no acute changes.  This is unchanged to the CT done on admission  Signed, Javier Docker. Christopherjame Carnell MD 06/17/2021, 9:36 AM  Pager: [phone]

## 2021-06-17 NOTE — Progress Notes (Signed)
PROGRESS NOTE    Chris Davis  DJS:[id number] DOB: 1932/11/01 DOA: 06/11/2021 PCP: Derinda Late, MD   Chief complaint generalized weakness. Brief Narrative:  Chris Davis is a 87 y.o. male with medical history significant for chronic systolic heart failure with most recent echocardiogram in July 2020 showing LVEF 40%, persistent atrial fibrillation chronically anticoagulated on Eliquis, hypertension, hyperlipidemia, who is admitted to [facility] on 06/11/2021 with acute on chronic systolic heart failure after presenting from home [facility] assisted living) to [facility] ED complaining of generalized weakness..  Was diagnosed with acute on chronic systolic congestive heart failure, he was giving IV diuretics, he developed significant hypotension.  He received IV fluid bolus.  He was put back on metoprolol on 6/29. His heart rate went up on 6/30 with atrial fibrillation.  He is given IV diltiazem, and digoxin. 7/2.  Blood pressure is more stable, he shows evidence of volume overload now.  IV Lasix drip started.   Assessment & Plan:   Principal Problem:   Acute on chronic systolic (congestive) heart failure (HCC) Active Problems:   HTN (hypertension)   Atrial fibrillation (HCC)   Hypotension   HLD (hyperlipidemia)   Peripheral edema   Generalized weakness   Falls   Acute hyponatremia  #1.  Hypotension. Acute on chronic systolic congestive heart failure. Persistent atrial fibrillation with rapid ventricular response. Patient was started on IV Lasix yesterday due to volume overload.  He diuresing well.  Blood pressure is borderline low today, will give 25 g albumin. Heart rate much better today, continue metoprolol. Continue Eliquis. Discussed with Dr.Fath.     #2.  Hyponatremia. Sodium level better after giving Lasix.  3.  Generalized weakness. Continue PT/OT, pending nursing home placement.    DVT prophylaxis: Eliquis Code Status: DNR Family  Communication:  Disposition Plan:    Status is: Inpatient  Remains inpatient appropriate because:IV treatments appropriate due to intensity of illness or inability to take PO and Inpatient level of care appropriate due to severity of illness  Dispo: The patient is from: Home              Anticipated d/c is to: SNF              Patient currently is not medically stable to d/c.   Difficult to place patient No        I/O last 3 completed shifts: In: 1117.3 [P.O.:1080; I.V.:37.3] Out: 3500 [Urine:3500] No intake/output data recorded.     Consultants:  Cardiology  Procedures: None  Antimicrobials: None  Subjective: Patient feels well today, he slept well last night.  He does not have any confusion this morning. Denies any short of breath or cough. No abdominal pain or nausea vomiting. No dysuria hematuria. No headache or dizziness. No chest pain or palpitation.  Objective: Vitals:   06/17/21 0539 06/17/21 0645 06/17/21 0651 06/17/21 0800  BP: (!) 145/72  (!) (P) 88/55 (!) 89/48  Pulse: 85   74  Resp: (!) 21  18 15   Temp: 97.8 F (36.6 C)     TempSrc:      SpO2: 97%  96% 97%  Weight:  82.4 kg    Height:        Intake/Output Summary (Last 24 hours) at 06/17/2021 1010 Last data filed at 06/17/2021 0700 Gross per 24 hour  Intake 1117.29 ml  Output 2600 ml  Net -1482.71 ml   Filed Weights   06/14/21 0500 06/16/21 0321 06/17/21 0645  Weight: 84.7 kg  85.1 kg 82.4 kg    Examination:  General exam: Appears calm and comfortable  Respiratory system: Clear to auscultation. Respiratory effort normal. Cardiovascular system: Irregular, no JVD, murmurs, rubs, gallops or clicks. Gastrointestinal system: Abdomen is nondistended, soft and nontender. No organomegaly or masses felt. Normal bowel sounds heard. Central nervous system: Alert and oriented. No focal neurological deficits. Extremities: 2+ leg edema Skin: No rashes, lesions or ulcers Psychiatry: Judgement and  insight appear normal. Mood & affect appropriate.     Data Reviewed: I have personally reviewed following labs and imaging studies  CBC: Recent Labs  Lab 06/11/21 2003 06/12/21 0657 06/13/21 0541 06/16/21 0410  WBC 7.8 6.2 6.9 9.6  NEUTROABS 5.7  --  4.9  --   HGB 14.7 13.4 13.7 13.1  HCT 44.7 39.7 40.8 39.7  MCV 90.1 89.6 89.9 90.4  PLT 238 209 220 449   Basic Metabolic Panel: Recent Labs  Lab 06/12/21 0657 06/13/21 0541 06/15/21 0524 06/16/21 0410 06/17/21 0424  NA 137 134* 134* 133* 135  K 4.1 4.0 4.8 4.7 3.8  CL 104 105 107 105 105  CO2 25 21* 22 23 23   GLUCOSE 88 113* 102* 121* 88  BUN 26* 26* 29* 34* 45*  CREATININE 1.05 1.03 0.96 1.10 0.95  CALCIUM 8.5* 8.3* 8.4* 8.4* 8.3*  MG 2.2 2.1 2.1 2.1 2.1  PHOS 3.3  --   --   --   --    GFR: Estimated Creatinine Clearance: 58.4 mL/min (by C-G formula based on SCr of 0.95 mg/dL). Liver Function Tests: Recent Labs  Lab 06/11/21 2003 06/12/21 0657  AST 25 19  ALT 17 13  ALKPHOS 91 77  BILITOT 1.4* 1.6*  PROT 6.8 5.9*  ALBUMIN 3.7 3.0*   No results for input(s): LIPASE, AMYLASE in the last 168 hours. No results for input(s): AMMONIA in the last 168 hours. Coagulation Profile: No results for input(s): INR, PROTIME in the last 168 hours. Cardiac Enzymes: No results for input(s): CKTOTAL, CKMB, CKMBINDEX, TROPONINI in the last 168 hours. BNP (last 3 results) No results for input(s): PROBNP in the last 8760 hours. HbA1C: No results for input(s): HGBA1C in the last 72 hours. CBG: No results for input(s): GLUCAP in the last 168 hours. Lipid Profile: No results for input(s): CHOL, HDL, LDLCALC, TRIG, CHOLHDL, LDLDIRECT in the last 72 hours. Thyroid Function Tests: No results for input(s): TSH, T4TOTAL, FREET4, T3FREE, THYROIDAB in the last 72 hours. Anemia Panel: No results for input(s): VITAMINB12, FOLATE, FERRITIN, TIBC, IRON, RETICCTPCT in the last 72 hours. Sepsis Labs: Recent Labs  Lab 06/12/21 0657   PROCALCITON <0.10    Recent Results (from the past 240 hour(s))  Resp Panel by RT-PCR (Flu A&B, Covid) Nasopharyngeal Swab     Status: None   Collection Time: 06/11/21  8:03 PM   Specimen: Nasopharyngeal Swab; Nasopharyngeal(NP) swabs in vial transport medium  Result Value Ref Range Status   SARS Coronavirus 2 by RT PCR NEGATIVE NEGATIVE Final    Comment: (NOTE) SARS-CoV-2 target nucleic acids are NOT DETECTED.  The SARS-CoV-2 RNA is generally detectable in upper respiratory specimens during the acute phase of infection. The lowest concentration of SARS-CoV-2 viral copies this assay can detect is 138 copies/mL. A negative result does not preclude SARS-Cov-2 infection and should not be used as the sole basis for treatment or other patient management decisions. A negative result may occur with  improper specimen collection/handling, submission of specimen other than nasopharyngeal swab, presence of viral mutation(s)  within the areas targeted by this assay, and inadequate number of viral copies(<138 copies/mL). A negative result must be combined with clinical observations, patient history, and epidemiological information. The expected result is Negative.  Fact Sheet for Patients:  EntrepreneurPulse.com.au  Fact Sheet for Healthcare Providers:  IncredibleEmployment.be  This test is no t yet approved or cleared by the [place] FDA and  has been authorized for detection and/or diagnosis of SARS-CoV-2 by FDA under an Emergency Use Authorization (EUA). This EUA will remain  in effect (meaning this test can be used) for the duration of the COVID-19 declaration under Section 564(b)(1) of the Act, 21 U.S.C.section 360bbb-3(b)(1), unless the authorization is terminated  or revoked sooner.       Influenza A by PCR NEGATIVE NEGATIVE Final   Influenza B by PCR NEGATIVE NEGATIVE Final    Comment: (NOTE) The Xpert Xpress SARS-CoV-2/FLU/RSV plus  assay is intended as an aid in the diagnosis of influenza from Nasopharyngeal swab specimens and should not be used as a sole basis for treatment. Nasal washings and aspirates are unacceptable for Xpert Xpress SARS-CoV-2/FLU/RSV testing.  Fact Sheet for Patients: EntrepreneurPulse.com.au  Fact Sheet for Healthcare Providers: IncredibleEmployment.be  This test is not yet approved or cleared by the [place] FDA and has been authorized for detection and/or diagnosis of SARS-CoV-2 by FDA under an Emergency Use Authorization (EUA). This EUA will remain in effect (meaning this test can be used) for the duration of the COVID-19 declaration under Section 564(b)(1) of the Act, 21 U.S.C. section 360bbb-3(b)(1), unless the authorization is terminated or revoked.  Performed at [facility], [address], [place], [place] [address]          Radiology Studies: No results found.      Scheduled Meds:  apixaban  5 mg Oral BID   empagliflozin  10 mg Oral Q breakfast   Gerhardt's butt cream   Topical BID   loratadine  10 mg Oral Daily   metoprolol tartrate  50 mg Oral BID   multivitamin with minerals  1 tablet Oral Daily   Continuous Infusions:  albumin human     furosemide (LASIX) 200 mg in dextrose 5% 100 mL (2mg /mL) infusion 4 mg/hr (06/16/21 1201)     LOS: 5 days    Time spent: 32 minutes    Sharen Hones, MD [facility]   To contact the attending provider between 7A-7P or the covering provider during after hours 7P-7A, please log into the web site [email or web address] and access using universal [place] password for that web site. If you do not have the password, please call the hospital operator.  06/17/2021, 10:10 AM

## 2021-06-18 LAB — CBC WITH DIFFERENTIAL/PLATELET
Abs Immature Granulocytes: 0.04 10*3/uL (ref 0.00–0.07)
Basophils Absolute: 0.1 10*3/uL (ref 0.0–0.1)
Basophils Relative: 1 %
Eosinophils Absolute: 0.3 10*3/uL (ref 0.0–0.5)
Eosinophils Relative: 5 %
HCT: 39.9 % (ref 39.0–52.0)
Hemoglobin: 13.5 g/dL (ref 13.0–17.0)
Immature Granulocytes: 1 %
Lymphocytes Relative: 10 %
Lymphs Abs: 0.7 10*3/uL (ref 0.7–4.0)
MCH: 29.6 pg (ref 26.0–34.0)
MCHC: 33.8 g/dL (ref 30.0–36.0)
MCV: 87.5 fL (ref 80.0–100.0)
Monocytes Absolute: 0.9 10*3/uL (ref 0.1–1.0)
Monocytes Relative: 13 %
Neutro Abs: 4.8 10*3/uL (ref 1.7–7.7)
Neutrophils Relative %: 70 %
Platelets: 254 10*3/uL (ref 150–400)
RBC: 4.56 MIL/uL (ref 4.22–5.81)
RDW: 15.3 % (ref 11.5–15.5)
WBC: 6.8 10*3/uL (ref 4.0–10.5)
nRBC: 0 % (ref 0.0–0.2)

## 2021-06-18 LAB — BASIC METABOLIC PANEL
Anion gap: 8 (ref 5–15)
BUN: 54 mg/dL — ABNORMAL HIGH (ref 8–23)
CO2: 23 mmol/L (ref 22–32)
Calcium: 8.3 mg/dL — ABNORMAL LOW (ref 8.9–10.3)
Chloride: 103 mmol/L (ref 98–111)
Creatinine, Ser: 0.99 mg/dL (ref 0.61–1.24)
GFR, Estimated: >60 mL/min (ref >60)
Glucose, Bld: 121 mg/dL — ABNORMAL HIGH (ref 70–99)
Potassium: 3.2 mmol/L — ABNORMAL LOW (ref 3.5–5.1)
Sodium: 134 mmol/L — ABNORMAL LOW (ref 135–145)

## 2021-06-18 LAB — MAGNESIUM: Magnesium: 2 mg/dL (ref 1.7–2.4)

## 2021-06-18 MED ORDER — FUROSEMIDE 10 MG/ML IJ SOLN
40.0000 mg | Freq: Every day | INTRAMUSCULAR | Status: DC
  Administered 2021-06-18 – 2021-06-19 (×2): 40 mg via INTRAVENOUS
  Filled 2021-06-18 (×2): qty 4

## 2021-06-18 MED ORDER — POTASSIUM CHLORIDE 10 MEQ/100ML IV SOLN
10.0000 meq | INTRAVENOUS | Status: AC
  Administered 2021-06-18 (×3): 10 meq via INTRAVENOUS
  Filled 2021-06-18 (×3): qty 100

## 2021-06-18 NOTE — Progress Notes (Signed)
PROGRESS NOTE    Chris Davis  WPY:[id number] DOB: January 27, 1932 DOA: 06/11/2021 PCP: Derinda Late, MD   Chief complaint.  General weakness. Brief Narrative:  Chris Davis is a 87 y.o. male with medical history significant for chronic systolic heart failure with most recent echocardiogram in July 2020 showing LVEF 40%, persistent atrial fibrillation chronically anticoagulated on Eliquis, hypertension, hyperlipidemia, who is admitted to [facility] on 06/11/2021 with acute on chronic systolic heart failure after presenting from home [facility] assisted living) to [facility] ED complaining of generalized weakness..  Was diagnosed with acute on chronic systolic congestive heart failure, he was giving IV diuretics, he developed significant hypotension.  He received IV fluid bolus.  He was put back on metoprolol on 6/29. His heart rate went up on 6/30 with atrial fibrillation.  He is given IV diltiazem, and digoxin. 7/2.  Blood pressure is more stable, he shows evidence of volume overload now.  IV Lasix drip started.   Assessment & Plan:   Principal Problem:   Acute on chronic systolic (congestive) heart failure (HCC) Active Problems:   HTN (hypertension)   Atrial fibrillation (HCC)   Hypotension   HLD (hyperlipidemia)   Peripheral edema   Generalized weakness   Falls   Acute hyponatremia  #1.  Hypotension. Acute on chronic systolic congestive heart failure. Persistent atrial fibrillation with rapid ventricular response. Patient condition is improving, heart rate is better controlled with oral metoprolol.  Diuresing is slowing down on Lasix drip, but volume status much better.  Discussed with Dr. Ubaldo Glassing, diuretics is changed to IV push.  Patient no longer has any significant hypotension after giving albumin yesterday. Patient also has a better appetite reflecting improving volume status. Planning transfer to nursing home tomorrow. Will also continue Eliquis for stroke  prevention.  2.  Hyponatremia Hypokalemia Sodium improving, will give potassium IV.  3.  Generalized weakness. Continue PT/OT.  Likely go to nursing home tomorrow.    DVT prophylaxis: Eliquis Code Status: DNR Family Communication:  Disposition Plan:    Status is: Inpatient  Remains inpatient appropriate because:IV treatments appropriate due to intensity of illness or inability to take PO and Inpatient level of care appropriate due to severity of illness  Dispo: The patient is from: Home              Anticipated d/c is to: SNF              Patient currently is not medically stable to d/c.   Difficult to place patient No        I/O last 3 completed shifts: In: 1605.8 [P.O.:1560; I.V.:45.8] Out: 2900 [Urine:2900] No intake/output data recorded.     Consultants:  Card  Procedures: None  Antimicrobials: None  Subjective: Patient condition is improving, he has a better appetite without nausea vomiting.  No abdominal pain, last bowel movement yesterday. He does not complain any short of breath.  No cough. No fever chills No dysuria hematuria No headache or dizziness. No chest pain or palpitation.   Objective: Vitals:   06/17/21 1618 06/17/21 1946 06/18/21 0500 06/18/21 0902  BP: 120/69 102/62  95/61  Pulse: 99 80  90  Resp: 19 20  20   Temp: (!) 97.4 F (36.3 C) 98.3 F (36.8 C)  (!) 97.2 F (36.2 C)  TempSrc: Oral Oral  Oral  SpO2: 99% 96%  97%  Weight:   82.2 kg   Height:        Intake/Output Summary (Last 24 hours)  at 06/18/2021 1052 Last data filed at 06/17/2021 1948 Gross per 24 hour  Intake 734.93 ml  Output 1450 ml  Net -715.07 ml   Filed Weights   06/16/21 0321 06/17/21 0645 06/18/21 0500  Weight: 85.1 kg 82.4 kg 82.2 kg    Examination:  General exam: Appears calm and comfortable  Respiratory system: Clear to auscultation. Respiratory effort normal. Cardiovascular system: Irregularly irregular.  No JVD, murmurs, rubs, gallops or  clicks. No pedal edema. Gastrointestinal system: Abdomen is nondistended, soft and nontender. No organomegaly or masses felt. Normal bowel sounds heard. Central nervous system: Alert and oriented. No focal neurological deficits. Extremities: 2+ leg edema Skin: No rashes, lesions or ulcers Psychiatry: Judgement and insight appear normal. Mood & affect appropriate.     Data Reviewed: I have personally reviewed following labs and imaging studies  CBC: Recent Labs  Lab 06/11/21 2003 06/12/21 0657 06/13/21 0541 06/16/21 0410 06/18/21 0512  WBC 7.8 6.2 6.9 9.6 6.8  NEUTROABS 5.7  --  4.9  --  4.8  HGB 14.7 13.4 13.7 13.1 13.5  HCT 44.7 39.7 40.8 39.7 39.9  MCV 90.1 89.6 89.9 90.4 87.5  PLT 238 209 220 240 759   Basic Metabolic Panel: Recent Labs  Lab 06/12/21 0657 06/13/21 0541 06/15/21 0524 06/16/21 0410 06/17/21 0424 06/18/21 0512  NA 137 134* 134* 133* 135 134*  K 4.1 4.0 4.8 4.7 3.8 3.2*  CL 104 105 107 105 105 103  CO2 25 21* 22 23 23 23   GLUCOSE 88 113* 102* 121* 88 121*  BUN 26* 26* 29* 34* 45* 54*  CREATININE 1.05 1.03 0.96 1.10 0.95 0.99  CALCIUM 8.5* 8.3* 8.4* 8.4* 8.3* 8.3*  MG 2.2 2.1 2.1 2.1 2.1 2.0  PHOS 3.3  --   --   --   --   --    GFR: Estimated Creatinine Clearance: 56 mL/min (by C-G formula based on SCr of 0.99 mg/dL). Liver Function Tests: Recent Labs  Lab 06/11/21 2003 06/12/21 0657  AST 25 19  ALT 17 13  ALKPHOS 91 77  BILITOT 1.4* 1.6*  PROT 6.8 5.9*  ALBUMIN 3.7 3.0*   No results for input(s): LIPASE, AMYLASE in the last 168 hours. No results for input(s): AMMONIA in the last 168 hours. Coagulation Profile: No results for input(s): INR, PROTIME in the last 168 hours. Cardiac Enzymes: No results for input(s): CKTOTAL, CKMB, CKMBINDEX, TROPONINI in the last 168 hours. BNP (last 3 results) No results for input(s): PROBNP in the last 8760 hours. HbA1C: No results for input(s): HGBA1C in the last 72 hours. CBG: No results for  input(s): GLUCAP in the last 168 hours. Lipid Profile: No results for input(s): CHOL, HDL, LDLCALC, TRIG, CHOLHDL, LDLDIRECT in the last 72 hours. Thyroid Function Tests: No results for input(s): TSH, T4TOTAL, FREET4, T3FREE, THYROIDAB in the last 72 hours. Anemia Panel: No results for input(s): VITAMINB12, FOLATE, FERRITIN, TIBC, IRON, RETICCTPCT in the last 72 hours. Sepsis Labs: Recent Labs  Lab 06/12/21 0657  PROCALCITON <0.10    Recent Results (from the past 240 hour(s))  Resp Panel by RT-PCR (Flu A&B, Covid) Nasopharyngeal Swab     Status: None   Collection Time: 06/11/21  8:03 PM   Specimen: Nasopharyngeal Swab; Nasopharyngeal(NP) swabs in vial transport medium  Result Value Ref Range Status   SARS Coronavirus 2 by RT PCR NEGATIVE NEGATIVE Final    Comment: (NOTE) SARS-CoV-2 target nucleic acids are NOT DETECTED.  The SARS-CoV-2 RNA is generally detectable in  upper respiratory specimens during the acute phase of infection. The lowest concentration of SARS-CoV-2 viral copies this assay can detect is 138 copies/mL. A negative result does not preclude SARS-Cov-2 infection and should not be used as the sole basis for treatment or other patient management decisions. A negative result may occur with  improper specimen collection/handling, submission of specimen other than nasopharyngeal swab, presence of viral mutation(s) within the areas targeted by this assay, and inadequate number of viral copies(<138 copies/mL). A negative result must be combined with clinical observations, patient history, and epidemiological information. The expected result is Negative.  Fact Sheet for Patients:  EntrepreneurPulse.com.au  Fact Sheet for Healthcare Providers:  IncredibleEmployment.be  This test is no t yet approved or cleared by the [place] FDA and  has been authorized for detection and/or diagnosis of SARS-CoV-2 by FDA under an Emergency Use  Authorization (EUA). This EUA will remain  in effect (meaning this test can be used) for the duration of the COVID-19 declaration under Section 564(b)(1) of the Act, 21 U.S.C.section 360bbb-3(b)(1), unless the authorization is terminated  or revoked sooner.       Influenza A by PCR NEGATIVE NEGATIVE Final   Influenza B by PCR NEGATIVE NEGATIVE Final    Comment: (NOTE) The Xpert Xpress SARS-CoV-2/FLU/RSV plus assay is intended as an aid in the diagnosis of influenza from Nasopharyngeal swab specimens and should not be used as a sole basis for treatment. Nasal washings and aspirates are unacceptable for Xpert Xpress SARS-CoV-2/FLU/RSV testing.  Fact Sheet for Patients: EntrepreneurPulse.com.au  Fact Sheet for Healthcare Providers: IncredibleEmployment.be  This test is not yet approved or cleared by the [place] FDA and has been authorized for detection and/or diagnosis of SARS-CoV-2 by FDA under an Emergency Use Authorization (EUA). This EUA will remain in effect (meaning this test can be used) for the duration of the COVID-19 declaration under Section 564(b)(1) of the Act, 21 U.S.C. section 360bbb-3(b)(1), unless the authorization is terminated or revoked.  Performed at [facility], [address], [place], [place] [address]          Radiology Studies: No results found.      Scheduled Meds:  apixaban  5 mg Oral BID   empagliflozin  10 mg Oral Q breakfast   Gerhardt's butt cream   Topical BID   loratadine  10 mg Oral Daily   metoprolol tartrate  50 mg Oral BID   multivitamin with minerals  1 tablet Oral Daily   Continuous Infusions:  furosemide (LASIX) 200 mg in dextrose 5% 100 mL (2mg /mL) infusion 4 mg/hr (06/16/21 1201)   potassium chloride 10 mEq (06/18/21 0931)     LOS: 6 days    Time spent: 32 minutes    Sharen Hones, MD [facility]   To contact the attending provider between 7A-7P or  the covering provider during after hours 7P-7A, please log into the web site [email or web address] and access using universal [place] password for that web site. If you do not have the password, please call the hospital operator.  06/18/2021, 10:52 AM

## 2021-06-18 NOTE — Care Management Important Message (Signed)
Important Message  Patient Details  Name: Chris Davis MRN: [id number] Date of Birth: Dec 17, 1931   Medicare Important Message Given:  Yes     Dannette Barbara 06/18/2021, 1:18 PM

## 2021-06-18 NOTE — Plan of Care (Signed)

## 2021-06-18 NOTE — Progress Notes (Signed)
Patient Name: Chris Davis Date of Encounter: 06/18/2021  Hospital Problem List     Principal Problem:   Acute on chronic systolic (congestive) heart failure (HCC) Active Problems:   HTN (hypertension)   Atrial fibrillation (HCC)   Hypotension   HLD (hyperlipidemia)   Peripheral edema   Generalized weakness   Falls   Acute hyponatremia    Patient Profile      87 year old male with a history of chronic systolic heart failure with an echocardiogram in July 2020 showing an EF of 40% with persistent atrial fibrillation which has been anticoagulated with Eliquis along with hypertension and hyperlipidemia.  He presented to the emergency room from his care facility complaining generalized fatigue and weakness.  Chest x-ray revealed lung volumes and atelectasis.  There were septal thickening and vascular congestion.  Initially was treated with diuretics however developed hypotension and his diuretics were stopped and he was given fluid resuscitation.  He was admitted on 06/11/2021.  Brain CT revealed generalized cerebral atrophy with no acute intracranial abnormality.  Laboratories on admission revealed a sodium of 133 with a creatinine of 1.08.  BNP was 386.7.  Is of note that 6 months ago was 432.3.  He was also very dizzy on presentation and this improved with fluid resuscitation.  He was taken off he has antihypertensive medications which included beta-blocker, ACE inhibitor and diuretics.  Clinically was not felt to be volume overloaded.  He was in persistent atrial fibrillation and Eliquis was continued.  He was restarted on a lower dose beta-blocker with metoprolol 25 twice daily yesterday.  This a.m. he developed more rapid A. fib.  He was given IV diltiazem and digoxin.    Subjective   Doing somewhat better.  More alert but still weakened and confused on occasion.  Rate is in better control.  Inpatient Medications     apixaban  5 mg Oral BID   empagliflozin  10 mg Oral Q breakfast    Gerhardt's butt cream   Topical BID   loratadine  10 mg Oral Daily   metoprolol tartrate  50 mg Oral BID   multivitamin with minerals  1 tablet Oral Daily    Vital Signs    Vitals:   06/17/21 1618 06/17/21 1946 06/18/21 0500 06/18/21 0902  BP: 120/69 102/62  95/61  Pulse: 99 80  90  Resp: 19 20  20   Temp: (!) 97.4 F (36.3 C) 98.3 F (36.8 C)  (!) 97.2 F (36.2 C)  TempSrc: Oral Oral  Oral  SpO2: 99% 96%  97%  Weight:   82.2 kg   Height:        Intake/Output Summary (Last 24 hours) at 06/18/2021 0956 Last data filed at 06/17/2021 1948 Gross per 24 hour  Intake 974.93 ml  Output 1450 ml  Net -475.07 ml   Filed Weights   06/16/21 0321 06/17/21 0645 06/18/21 0500  Weight: 85.1 kg 82.4 kg 82.2 kg    Physical Exam    GEN: Well nourished, well developed, in no acute distress.  HEENT: normal.  Neck: Supple, no JVD, carotid bruits, or masses. Cardiac: Irregular regular rhythm Respiratory: Decreased breath sounds bilaterally. GI: Soft, nontender, nondistended, BS + x 4. MS: no deformity or atrophy. Skin: warm and dry, no rash. Neuro:  Strength and sensation are intact. Psych: Normal affect.  Labs    CBC Recent Labs    06/16/21 0410 06/18/21 0512  WBC 9.6 6.8  NEUTROABS  --  4.8  HGB 13.1 13.5  HCT 39.7 39.9  MCV 90.4 87.5  PLT 240 235   Basic Metabolic Panel Recent Labs    06/17/21 0424 06/18/21 0512  NA 135 134*  K 3.8 3.2*  CL 105 103  CO2 23 23  GLUCOSE 88 121*  BUN 45* 54*  CREATININE 0.95 0.99  CALCIUM 8.3* 8.3*  MG 2.1 2.0   Liver Function Tests No results for input(s): AST, ALT, ALKPHOS, BILITOT, PROT, ALBUMIN in the last 72 hours. No results for input(s): LIPASE, AMYLASE in the last 72 hours. Cardiac Enzymes No results for input(s): CKTOTAL, CKMB, CKMBINDEX, TROPONINI in the last 72 hours. BNP No results for input(s): BNP in the last 72 hours. D-Dimer No results for input(s): DDIMER in the last 72 hours. Hemoglobin A1C No results for  input(s): HGBA1C in the last 72 hours. Fasting Lipid Panel No results for input(s): CHOL, HDL, LDLCALC, TRIG, CHOLHDL, LDLDIRECT in the last 72 hours. Thyroid Function Tests No results for input(s): TSH, T4TOTAL, T3FREE, THYROIDAB in the last 72 hours.  Invalid input(s): FREET3  Telemetry    Atrial fibrillation with controlled ventricular response  ECG       Radiology    DG Chest 2 View  Result Date: 06/11/2021 CLINICAL DATA:  Multiple falls, weakness EXAM: CHEST - 2 VIEW COMPARISON:  Radiograph 07/21/2017 FINDINGS: Chronically coarsened interstitial and bronchitic changes. Some peripheral septal (Kerley B) line are noted in the lung periphery with pulmonary vascular congestion and cephalization. Cardiomegaly is similar to comparison prior. The aorta is calcified. The remaining cardiomediastinal contours are unremarkable. No pneumothorax. The osseous structures appear diffusely demineralized which may limit detection of small or nondisplaced fractures. No acute osseous abnormality or suspicious osseous lesion. Telemetry leads overlie the chest. Chest wall soft tissues are unremarkable. Bowel gas pattern is nonobstructive IMPRESSION: Low volumes and atelectasis. Septal thickening and vascular congestion may suggest developing interstitial edema. Trace bilateral effusions suspected as well. Cardiomegaly and Aortic Atherosclerosis (ICD10-I70.0). Electronically Signed   By: Lovena Le M.D.   On: 06/11/2021 20:21   CT HEAD WO CONTRAST  Result Date: 06/14/2021 CLINICAL DATA:  Acute neuro deficit. EXAM: CT HEAD WITHOUT CONTRAST TECHNIQUE: Contiguous axial images were obtained from the base of the skull through the vertex without intravenous contrast. COMPARISON:  CT head 06/11/2021 FINDINGS: Brain: Moderate atrophy.  Mild white matter hypodensity bilaterally. Negative for acute infarct, hemorrhage, mass Vascular: Negative for hyperdense vessel Skull: Negative Sinuses/Orbits: Retention cyst right  maxillary sinus. Mild mucosal edema right ethmoid sinus. Remaining sinuses clear. Bilateral cataract extraction. Other: None IMPRESSION: No acute abnormality no change from 3 days ago. Moderate atrophy and mild chronic microvascular ischemic type changes in the white matter. Electronically Signed   By: Franchot Gallo M.D.   On: 06/14/2021 16:51   CT Head Wo Contrast  Result Date: 06/11/2021 CLINICAL DATA:  Status post multiple falls. EXAM: CT HEAD WITHOUT CONTRAST TECHNIQUE: Contiguous axial images were obtained from the base of the skull through the vertex without intravenous contrast. COMPARISON:  November 02, 2020 FINDINGS: Brain: There is mild cerebral atrophy with widening of the extra-axial spaces and ventricular dilatation. There are areas of decreased attenuation within the white matter tracts of the supratentorial brain, consistent with microvascular disease changes. Vascular: No hyperdense vessel or unexpected calcification. Skull: Normal. Negative for fracture or focal lesion. Sinuses/Orbits: A 1.8 cm x 1.3 cm right maxillary sinus polyp versus mucous retention cyst is seen. Other: None. IMPRESSION: 1. Generalized cerebral atrophy. 2. No acute intracranial abnormality. Electronically Signed  By: Virgina Norfolk M.D.   On: 06/11/2021 20:26   ECHOCARDIOGRAM COMPLETE  Result Date: 06/12/2021    ECHOCARDIOGRAM REPORT   Patient Name:   Chris Davis Date of Exam: 06/12/2021 Medical Rec #:  [id number]   Height:       68.0 in Accession #:    [id number]  Weight:       182.2 lb Date of Birth:  [birth year]   BSA:          1.964 m Patient Age:    15 years    BP:           13/48 mmHg Patient Gender: M           HR:           73 bpm. Exam Location:  [facility] Procedure: 2D Echo, Cardiac Doppler and Color Doppler Indications:     CHF-acute systolic X93.71  History:         Patient has no prior history of Echocardiogram examinations.                  Arrythmias:Atrial Fibrillation; Risk Factors:Hypertension.   Sonographer:     Sherrie Sport RDCS (AE) Referring Phys:  [id number] Rhetta Mura Diagnosing Phys: Yolonda Kida MD  Sonographer Comments: No apical window and no subcostal window. IMPRESSIONS  1. Left ventricular ejection fraction, by estimation, is 70 to 75%. The left ventricle has hyperdynamic function. The left ventricle has no regional wall motion abnormalities. Left ventricular diastolic parameters were normal.  2. Right ventricular systolic function is mildly reduced. The right ventricular size is mildly enlarged. Mildly increased right ventricular wall thickness.  3. The mitral valve is normal in structure. Mild mitral valve regurgitation.  4. The aortic valve is normal in structure. Aortic valve regurgitation is not visualized. Mild aortic valve sclerosis is present, with no evidence of aortic valve stenosis. FINDINGS  Left Ventricle: Left ventricular ejection fraction, by estimation, is 70 to 75%. The left ventricle has hyperdynamic function. The left ventricle has no regional wall motion abnormalities. The left ventricular internal cavity size was normal in size. There is no left ventricular hypertrophy. Left ventricular diastolic parameters were normal. Right Ventricle: The right ventricular size is mildly enlarged. Mildly increased right ventricular wall thickness. Right ventricular systolic function is mildly reduced. Left Atrium: Left atrial size was normal in size. Right Atrium: Right atrial size was normal in size. Pericardium: There is no evidence of pericardial effusion. Mitral Valve: The mitral valve is normal in structure. Mild mitral valve regurgitation. Tricuspid Valve: The tricuspid valve is normal in structure. Tricuspid valve regurgitation is trivial. Aortic Valve: The aortic valve is normal in structure. Aortic valve regurgitation is not visualized. Mild aortic valve sclerosis is present, with no evidence of aortic valve stenosis. Pulmonic Valve: The pulmonic valve was normal in  structure. Pulmonic valve regurgitation is not visualized. Aorta: The ascending aorta was not well visualized. IAS/Shunts: No atrial level shunt detected by color flow Doppler.  LEFT VENTRICLE PLAX 2D LVIDd:         3.94 cm LVIDs:         2.19 cm LV PW:         0.88 cm LV IVS:        0.94 cm LVOT diam:     2.10 cm LVOT Area:     3.46 cm  LEFT ATRIUM         Index LA diam:    5.10 cm 2.60 cm/m  PULMONIC VALVE AORTA                 PV Vmax:        0.60 m/s Ao Root diam: 3.70 cm PV Peak grad:   1.4 mmHg                       RVOT Peak grad: 4 mmHg   SHUNTS Systemic Diam: 2.10 cm Yolonda Kida MD Electronically signed by Yolonda Kida MD Signature Date/Time: 06/12/2021/4:54:02 PM    Final    DG Hip Unilat W or Wo Pelvis 2-3 Views Right  Result Date: 06/11/2021 CLINICAL DATA:  87 year old male with fall and right hip pain. EXAM: DG HIP (WITH OR WITHOUT PELVIS) 2-3V RIGHT COMPARISON:  None. FINDINGS: There is no acute fracture or dislocation. The bones are osteopenic. Mild bilateral hip arthritic changes. There is slight cortical prominence of the lateral femoral heads which may predispose to cam type femoroacetabular impingement. The soft tissues are unremarkable. There acute therapy seeds of the prostate gland noted. IMPRESSION: 1. No acute fracture or dislocation. 2. Mild bilateral hip arthritic changes. Electronically Signed   By: Anner Crete M.D.   On: 06/11/2021 20:44    Assessment & Plan    1.  CHF-appears to have volume overload.  Will discontinue his Lasix drip and placed on IV Lasix 40 mg daily.  He has diuresed approximately 4.6L since admission.  Renal function stable.  Would continue with metoprolol at 25 twice daily and will continue with Jardiance.  We will need to follow electrolytes, hemodynamics and renal function.  Echocardiogram revealed normal LV function.  This appears to be diastolic dysfunction.  2.  A. fib with RVR-we will attempt to control rate with  metoprolol tartrate 50 mg twice daily.  May need to use intermittent doses of IV or p.o. digoxin if rate continues to be a problem and blood pressure does not allow more aggressive beta or calcium blocker therapy.  Would continue with Eliquis at 5 mg twice daily following renal function and for evidence of bleeding.  3.  Altered mental status-this is the first time I have spoken with him.  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  Head CT done earlier this admission showed mild to moderate atrophy with no acute changes.  This is unchanged to the CT done on admission  Signed, Javier Docker. Edrei Norgaard MD 06/18/2021, 9:56 AM  Pager: [phone]

## 2021-06-19 ENCOUNTER — Ambulatory Visit: Payer: Medicare PPO | Admitting: Family

## 2021-06-19 LAB — BASIC METABOLIC PANEL
Anion gap: 6 (ref 5–15)
BUN: 52 mg/dL — ABNORMAL HIGH (ref 8–23)
CO2: 28 mmol/L (ref 22–32)
Calcium: 8.3 mg/dL — ABNORMAL LOW (ref 8.9–10.3)
Chloride: 101 mmol/L (ref 98–111)
Creatinine, Ser: 1.04 mg/dL (ref 0.61–1.24)
GFR, Estimated: >60 mL/min (ref >60)
Glucose, Bld: 93 mg/dL (ref 70–99)
Potassium: 3.7 mmol/L (ref 3.5–5.1)
Sodium: 135 mmol/L (ref 135–145)

## 2021-06-19 LAB — MAGNESIUM: Magnesium: 2.2 mg/dL (ref 1.7–2.4)

## 2021-06-19 MED ORDER — TORSEMIDE 20 MG PO TABS: 20.0000 mg | ORAL_TABLET | Freq: Every day | ORAL | 0 refills | Status: DC

## 2021-06-19 NOTE — TOC Transition Note (Signed)
.  [facility] - CM[facility]SW Discharge Note   Patient Details  Name: Chris Davis MRN: [id number] Date of Birth: [birth year]  [facility]) CM/SW Contact:  Eileen Stanford, LCSW Phone Number: 06/19/2021, 12:03 PM   Clinical Narrative:   Clinical Social Worker facilitated patient discharge including contacting patient family and facility to confirm patient discharge plans.  Clinical information faxed to facility and family agreeable with plan.  CSW arranged ambulance transport via [facility] to [facility] (room 105) .  RN to call [phone] report prior to discharge.    Final next level of care: Skilled Nursing Facility Barriers to Discharge: No Barriers Identified   Patient Goals and CMS Choice Patient states their goals for this hospitalization and ongoing recovery are:: to go to SNF CMS Medicare.gov Compare Post Acute Care list provided to:: Patient Represenative (must comment) (spouse Chris Davis) Choice offered to / list presented to : Spouse  Discharge Placement              Patient [facility] bed at:  [facility]) Patient to be transferred to facility by: [facility] Name of family member notified: Chris Davis Patient and family notified of of transfer: 06/19/21  Discharge Plan and Services     Post Acute Care Choice: [facility]                               Social Determinants of Health (SDOH) Interventions     Readmission Risk Interventions No flowsheet data found.

## 2021-06-19 NOTE — Discharge Summary (Signed)
[place] Discharge Summary  Patient ID: Chris Davis MRN: [id number] DOB/AGE: 12/28/1931 87 y.o.  Admit date: 06/11/2021 Discharge date: 06/19/2021  Admission Diagnoses:  Discharge Diagnoses:  Principal Problem:   Acute on chronic systolic (congestive) heart failure ([place]) Active Problems:   HTN (hypertension)   Atrial fibrillation (HCC)   Hypotension   HLD (hyperlipidemia)   Peripheral edema   Generalized weakness   Falls   Acute hyponatremia   Discharged Condition: fair  Hospital Course:  Chris Davis is a 87 y.o. male with medical history significant for chronic systolic heart failure with most recent echocardiogram in July 2020 showing LVEF 40%, persistent atrial fibrillation chronically anticoagulated on Eliquis, hypertension, hyperlipidemia, who is admitted to [facility] on 06/11/2021 with acute on chronic systolic heart failure after presenting from home ([facility] assisted living) to [facility] ED complaining of generalized weakness..  Was diagnosed with acute on chronic systolic congestive heart failure, he was giving IV diuretics, he developed significant hypotension.  He received IV fluid bolus.  He was put back on metoprolol on 6/29. His heart rate went up on 6/30 with atrial fibrillation.  He is given IV diltiazem, and digoxin. 7/2.  Blood pressure is more stable, he shows evidence of volume overload now.  IV Lasix drip started.  #1.  Hypotension. Acute on chronic systolic congestive heart failure. Persistent atrial fibrillation with rapid ventricular response. Patient has significant hypotension when he came to the hospital, he required several boluses.  After his blood pressure was better, he developed atrial fibrillation with rapid ventricle response, he was in briefly treated with diltiazem and and digoxin. After blood pressure is better, heart rate is better, patient appeared to have more volume overload.  We started Lasix drip again.  Currently  patient is diuresing well, he becomes euvolemic. Repeated echocardiogram seems ejection fraction has improved. At this point, he is medically stable to be discharged.  2.  Hyponatremia Hypokalemia Condition had improved.  3.  Generalized weakness. Transition to nursing home rehab.   Consults: cardiology  Significant Diagnostic Studies:   CT HEAD WITHOUT CONTRAST   TECHNIQUE: Contiguous axial images were obtained from the base of the skull through the vertex without intravenous contrast.   COMPARISON:  CT head 06/11/2021   FINDINGS: Brain: Moderate atrophy.  Mild white matter hypodensity bilaterally.   Negative for acute infarct, hemorrhage, mass   Vascular: Negative for hyperdense vessel   Skull: Negative   Sinuses/Orbits: Retention cyst right maxillary sinus. Mild mucosal edema right ethmoid sinus. Remaining sinuses clear. Bilateral cataract extraction.   Other: None   IMPRESSION: No acute abnormality no change from 3 days ago.   Moderate atrophy and mild chronic microvascular ischemic type changes in the white matter.     Electronically Signed   By: Franchot Gallo M.D.   On: 06/14/2021 16:51  Echo: 1. Left ventricular ejection fraction, by estimation, is 70 to 75%. The left ventricle has hyperdynamic function. The left ventricle has no regional wall motion abnormalities. Left ventricular diastolic parameters were normal. 2. Right ventricular systolic function is mildly reduced. The right ventricular size is mildly enlarged. Mildly increased right ventricular wall thickness. 3. The mitral valve is normal in structure. Mild mitral valve regurgitation. 4. The aortic valve is normal in structure. Aortic valve regurgitation is not visualized. Mild aortic valve sclerosis is present, with no evidence of aortic valve stenosis.  Treatments: IVF, metoprolol, lasix  Discharge Exam: Blood pressure 102/63, pulse 89, temperature 98.2 F (36.8 C), temperature source  Oral, resp. rate 20, height 5\' 8"  (1.727 m), weight 81.1 kg, SpO2 97 %. General appearance: alert and cooperative Resp: clear to auscultation bilaterally Cardio: Irregular, no murmurs GI: soft, non-tender; bowel sounds normal; no masses,  no organomegaly Extremities: 1+ edema  Disposition: Discharge disposition: 03-Skilled Nursing Facility       Discharge Instructions     Diet - low sodium heart healthy   Complete by: As directed    Diet - low sodium heart healthy   Complete by: As directed    Increase activity slowly   Complete by: As directed    Increase activity slowly   Complete by: As directed       Allergies as of 06/19/2021   No Known Allergies      Medication List     STOP taking these medications    carvedilol 3.125 MG tablet Commonly known as: COREG   lisinopril 2.5 MG tablet Commonly known as: ZESTRIL   spironolactone 25 MG tablet Commonly known as: ALDACTONE       TAKE these medications    acetaminophen 500 MG tablet Commonly known as: TYLENOL Take 500 mg by mouth every 6 (six) hours as needed (for pain/headaches.).   apixaban 5 MG Tabs tablet Commonly known as: Eliquis Take 1 tablet (5 mg total) by mouth 2 (two) times daily.   atorvastatin 20 MG tablet Commonly known as: LIPITOR Take 20 mg by mouth every evening.   Cranberry 500 MG Caps Take 500 mg by mouth daily.   empagliflozin 10 MG Tabs tablet Commonly known as: JARDIANCE Take 10 mg by mouth daily with breakfast.   metoprolol tartrate 50 MG tablet Commonly known as: LOPRESSOR Take 1 tablet (50 mg total) by mouth 2 (two) times daily.   multivitamin with minerals tablet Take 1 tablet by mouth daily.   torsemide 20 MG tablet Commonly known as: Demadex Take 1 tablet (20 mg total) by mouth daily. What changed: how much to take        Follow-up Information     Derinda Late, MD. Call.   Specialty: Family Medicine Why: Office will call patient to schedule  appointment. Contact information: [address] [facility] [place] [address] [phone]         Corey Skains, MD. Go on 06/28/2021.   Specialty: Cardiology Why: @ 11 am Contact information: [address] [facility] [place] [place] [address] [phone]                35 minutes Signed: Sharen Hones 06/19/2021, 10:19 AM

## 2021-06-19 NOTE — Progress Notes (Signed)
Patient Name: Chris Davis Date of Encounter: 06/19/2021  Hospital Problem List     Principal Problem:   Acute on chronic systolic (congestive) heart failure (HCC) Active Problems:   HTN (hypertension)   Atrial fibrillation (HCC)   Hypotension   HLD (hyperlipidemia)   Peripheral edema   Generalized weakness   Falls   Acute hyponatremia    Patient Profile      87 year old male with a history of chronic systolic heart failure with an echocardiogram in July 2020 showing an EF of 40% with persistent atrial fibrillation which has been anticoagulated with Eliquis along with hypertension and hyperlipidemia.  He presented to the emergency room from his care facility complaining generalized fatigue and weakness.  Chest x-ray revealed lung volumes and atelectasis.  There were septal thickening and vascular congestion.  Initially was treated with diuretics however developed hypotension and his diuretics were stopped and he was given fluid resuscitation.  He was admitted on 06/11/2021.  Brain CT revealed generalized cerebral atrophy with no acute intracranial abnormality.  Laboratories on admission revealed a sodium of 133 with a creatinine of 1.08.  BNP was 386.7.  Is of note that 6 months ago was 432.3.  He was also very dizzy on presentation and this improved with fluid resuscitation.  He was taken off he has antihypertensive medications which included beta-blocker, ACE inhibitor and diuretics.  Clinically was not felt to be volume overloaded.  He was in persistent atrial fibrillation and Eliquis was continued.  He was restarted on a lower dose beta-blocker with metoprolol 25 twice daily yesterday.  This a.m. he developed more rapid A. fib.  He was given IV diltiazem and digoxin.    Subjective     Doing good. No complaints.   Inpatient Medications     apixaban  5 mg Oral BID   empagliflozin  10 mg Oral Q breakfast   furosemide  40 mg Intravenous Daily   Gerhardt's butt cream   Topical BID    loratadine  10 mg Oral Daily   metoprolol tartrate  50 mg Oral BID   multivitamin with minerals  1 tablet Oral Daily    Vital Signs    Vitals:   06/19/21 0200 06/19/21 0424 06/19/21 0500 06/19/21 0736  BP: 121/69 100/62  102/63  Pulse: 94 84  89  Resp: 16 19  20   Temp: 98.1 F (36.7 C) 97.8 F (36.6 C)  98.2 F (36.8 C)  TempSrc: Oral Oral  Oral  SpO2: 98% 100%  97%  Weight:   81.1 kg   Height:        Intake/Output Summary (Last 24 hours) at 06/19/2021 1056 Last data filed at 06/18/2021 1930 Gross per 24 hour  Intake 290.41 ml  Output 1650 ml  Net -1359.59 ml   Filed Weights   06/17/21 0645 06/18/21 0500 06/19/21 0500  Weight: 82.4 kg 82.2 kg 81.1 kg    Physical Exam    GEN: Well nourished, well developed, in no acute distress.  HEENT: normal.  Neck: Supple, no JVD, carotid bruits, or masses. Cardiac: irr, irr Respiratory:  Respirations regular and unlabored. GI: Soft, nontender, nondistended, BS + x 4. MS: no deformity or atrophy. Skin: warm and dry, no rash. Neuro:  Strength and sensation are intact. Psych: Normal affect.  Labs    CBC Recent Labs    06/18/21 0512  WBC 6.8  NEUTROABS 4.8  HGB 13.5  HCT 39.9  MCV 87.5  PLT 144   Basic Metabolic  Panel Recent Labs    06/18/21 0512 06/19/21 0534  NA 134* 135  K 3.2* 3.7  CL 103 101  CO2 23 28  GLUCOSE 121* 93  BUN 54* 52*  CREATININE 0.99 1.04  CALCIUM 8.3* 8.3*  MG 2.0 2.2   Liver Function Tests No results for input(s): AST, ALT, ALKPHOS, BILITOT, PROT, ALBUMIN in the last 72 hours. No results for input(s): LIPASE, AMYLASE in the last 72 hours. Cardiac Enzymes No results for input(s): CKTOTAL, CKMB, CKMBINDEX, TROPONINI in the last 72 hours. BNP No results for input(s): BNP in the last 72 hours. D-Dimer No results for input(s): DDIMER in the last 72 hours. Hemoglobin A1C No results for input(s): HGBA1C in the last 72 hours. Fasting Lipid Panel No results for input(s): CHOL, HDL,  LDLCALC, TRIG, CHOLHDL, LDLDIRECT in the last 72 hours. Thyroid Function Tests No results for input(s): TSH, T4TOTAL, T3FREE, THYROIDAB in the last 72 hours.  Invalid input(s): FREET3  Telemetry    Afib with controlled vr  ECG       Radiology    DG Chest 2 View  Result Date: 06/11/2021 CLINICAL DATA:  Multiple falls, weakness EXAM: CHEST - 2 VIEW COMPARISON:  Radiograph 07/21/2017 FINDINGS: Chronically coarsened interstitial and bronchitic changes. Some peripheral septal (Kerley B) line are noted in the lung periphery with pulmonary vascular congestion and cephalization. Cardiomegaly is similar to comparison prior. The aorta is calcified. The remaining cardiomediastinal contours are unremarkable. No pneumothorax. The osseous structures appear diffusely demineralized which may limit detection of small or nondisplaced fractures. No acute osseous abnormality or suspicious osseous lesion. Telemetry leads overlie the chest. Chest wall soft tissues are unremarkable. Bowel gas pattern is nonobstructive IMPRESSION: Low volumes and atelectasis. Septal thickening and vascular congestion may suggest developing interstitial edema. Trace bilateral effusions suspected as well. Cardiomegaly and Aortic Atherosclerosis (ICD10-I70.0). Electronically Signed   By: Lovena Le M.D.   On: 06/11/2021 20:21   CT HEAD WO CONTRAST  Result Date: 06/14/2021 CLINICAL DATA:  Acute neuro deficit. EXAM: CT HEAD WITHOUT CONTRAST TECHNIQUE: Contiguous axial images were obtained from the base of the skull through the vertex without intravenous contrast. COMPARISON:  CT head 06/11/2021 FINDINGS: Brain: Moderate atrophy.  Mild white matter hypodensity bilaterally. Negative for acute infarct, hemorrhage, mass Vascular: Negative for hyperdense vessel Skull: Negative Sinuses/Orbits: Retention cyst right maxillary sinus. Mild mucosal edema right ethmoid sinus. Remaining sinuses clear. Bilateral cataract extraction. Other: None  IMPRESSION: No acute abnormality no change from 3 days ago. Moderate atrophy and mild chronic microvascular ischemic type changes in the white matter. Electronically Signed   By: Franchot Gallo M.D.   On: 06/14/2021 16:51   CT Head Wo Contrast  Result Date: 06/11/2021 CLINICAL DATA:  Status post multiple falls. EXAM: CT HEAD WITHOUT CONTRAST TECHNIQUE: Contiguous axial images were obtained from the base of the skull through the vertex without intravenous contrast. COMPARISON:  November 02, 2020 FINDINGS: Brain: There is mild cerebral atrophy with widening of the extra-axial spaces and ventricular dilatation. There are areas of decreased attenuation within the white matter tracts of the supratentorial brain, consistent with microvascular disease changes. Vascular: No hyperdense vessel or unexpected calcification. Skull: Normal. Negative for fracture or focal lesion. Sinuses/Orbits: A 1.8 cm x 1.3 cm right maxillary sinus polyp versus mucous retention cyst is seen. Other: None. IMPRESSION: 1. Generalized cerebral atrophy. 2. No acute intracranial abnormality. Electronically Signed   By: Virgina Norfolk M.D.   On: 06/11/2021 20:26   ECHOCARDIOGRAM COMPLETE  Result Date: 06/12/2021    ECHOCARDIOGRAM REPORT   Patient Name:   Chris Davis Date of Exam: 06/12/2021 Medical Rec #:  [id number]   Height:       68.0 in Accession #:    [id number]  Weight:       182.2 lb Date of Birth:  November 25, 1932   BSA:          1.964 m Patient Age:    83 years    BP:           13/48 mmHg Patient Gender: M           HR:           73 bpm. Exam Location:  [facility] Procedure: 2D Echo, Cardiac Doppler and Color Doppler Indications:     CHF-acute systolic T46.56  History:         Patient has no prior history of Echocardiogram examinations.                  Arrythmias:Atrial Fibrillation; Risk Factors:Hypertension.  Sonographer:     Sherrie Sport RDCS (AE) Referring Phys:  [id number] Rhetta Mura Diagnosing Phys: Yolonda Kida MD  Sonographer  Comments: No apical window and no subcostal window. IMPRESSIONS  1. Left ventricular ejection fraction, by estimation, is 70 to 75%. The left ventricle has hyperdynamic function. The left ventricle has no regional wall motion abnormalities. Left ventricular diastolic parameters were normal.  2. Right ventricular systolic function is mildly reduced. The right ventricular size is mildly enlarged. Mildly increased right ventricular wall thickness.  3. The mitral valve is normal in structure. Mild mitral valve regurgitation.  4. The aortic valve is normal in structure. Aortic valve regurgitation is not visualized. Mild aortic valve sclerosis is present, with no evidence of aortic valve stenosis. FINDINGS  Left Ventricle: Left ventricular ejection fraction, by estimation, is 70 to 75%. The left ventricle has hyperdynamic function. The left ventricle has no regional wall motion abnormalities. The left ventricular internal cavity size was normal in size. There is no left ventricular hypertrophy. Left ventricular diastolic parameters were normal. Right Ventricle: The right ventricular size is mildly enlarged. Mildly increased right ventricular wall thickness. Right ventricular systolic function is mildly reduced. Left Atrium: Left atrial size was normal in size. Right Atrium: Right atrial size was normal in size. Pericardium: There is no evidence of pericardial effusion. Mitral Valve: The mitral valve is normal in structure. Mild mitral valve regurgitation. Tricuspid Valve: The tricuspid valve is normal in structure. Tricuspid valve regurgitation is trivial. Aortic Valve: The aortic valve is normal in structure. Aortic valve regurgitation is not visualized. Mild aortic valve sclerosis is present, with no evidence of aortic valve stenosis. Pulmonic Valve: The pulmonic valve was normal in structure. Pulmonic valve regurgitation is not visualized. Aorta: The ascending aorta was not well visualized. IAS/Shunts: No atrial level  shunt detected by color flow Doppler.  LEFT VENTRICLE PLAX 2D LVIDd:         3.94 cm LVIDs:         2.19 cm LV PW:         0.88 cm LV IVS:        0.94 cm LVOT diam:     2.10 cm LVOT Area:     3.46 cm  LEFT ATRIUM         Index LA diam:    5.10 cm 2.60 cm/m  PULMONIC VALVE AORTA                 PV Vmax:        0.60 m/s Ao Root diam: 3.70 cm PV Peak grad:   1.4 mmHg                       RVOT Peak grad: 4 mmHg   SHUNTS Systemic Diam: 2.10 cm Yolonda Kida MD Electronically signed by Yolonda Kida MD Signature Date/Time: 06/12/2021/4:54:02 PM    Final    DG Hip Unilat W or Wo Pelvis 2-3 Views Right  Result Date: 06/11/2021 CLINICAL DATA:  87 year old male with fall and right hip pain. EXAM: DG HIP (WITH OR WITHOUT PELVIS) 2-3V RIGHT COMPARISON:  None. FINDINGS: There is no acute fracture or dislocation. The bones are osteopenic. Mild bilateral hip arthritic changes. There is slight cortical prominence of the lateral femoral heads which may predispose to cam type femoroacetabular impingement. The soft tissues are unremarkable. There acute therapy seeds of the prostate gland noted. IMPRESSION: 1. No acute fracture or dislocation. 2. Mild bilateral hip arthritic changes. Electronically Signed   By: Anner Crete M.D.   On: 06/11/2021 20:44    Assessment & Plan     1.  CHF-appears to have volume overload.  Will discontinue his Lasix drip and placed on IV Lasix 40 mg daily.  He has diuresed approximately 4.6L since admission.  Renal function stable.  Would continue with metoprolol at 25 twice daily and will continue with Jardiance.  We will need to follow electrolytes, hemodynamics and renal function.  Echocardiogram revealed normal LV function.  This appears to be diastolic dysfunction. No clinical evidence of chf at present.   2.  A. fib with RVR-we will attempt to control rate with metoprolol tartrate 50 mg twice daily.  May need to use intermittent doses of IV or p.o. digoxin  if rate continues to be a problem and blood pressure does not allow more aggressive beta or calcium blocker therapy.  Would continue with Eliquis at 5 mg twice daily following renal function and for evidence of bleeding.  3.  Altered mental status-  He is stable with this at present.  Head CT done earlier this admission showed mild to moderate atrophy with no acute changes.  This is unchanged to the CT done on admission  OK for discharge from cardiac standpoint.   Signed, Javier Docker Dandra Shambaugh MD 06/19/2021, 10:56 AM  Pager: [phone]

## 2021-06-19 NOTE — Progress Notes (Signed)
Physical Therapy Treatment Patient Details Name: Chris Davis MRN: [id number] DOB: [birth year] Today's Date: 06/19/2021    History of Present Illness 87 y.o. male with medical history significant for chronic systolic heart failure with most recent echocardiogram in July 2020 showing LVEF 40%, persistent atrial fibrillation chronically anticoagulated on Eliquis, hypertension, hyperlipidemia, who is admitted to [facility] on 06/11/2021 with acute on chronic systolic heart failure after presenting from home [facility] independent living) complaining of generalized weakness.    PT Comments    Patient generally stiff/rigid throughout trunk/extremities, requiring increased assist for all movement transitions during session.  Limited ability to actively problem-solve and initiate movement/balance correction.  Poor midline positioning/weight shift in unsupported sitting, requiring max assist +1-2 to maintain sitting position due to heavy pushing/leaning towards R.  Partially corrects with max cuing/facilitation from therapist, but unable to sustain independently.  Unsafe to progress/attempt towards standing/OOB at this time.     Follow Up Recommendations  SNF     Equipment Recommendations  None recommended by PT    Recommendations for Other Services       Precautions / Restrictions Precautions Precautions: Fall Restrictions Weight Bearing Restrictions: No    Mobility  Bed Mobility Overal bed mobility: Needs Assistance Bed Mobility: Supine to Sit;Sit to Supine     Supine to sit: Total assist;Max assist;+2 for physical assistance Sit to supine: Total assist;Max assist;+2 for physical assistance   General bed mobility comments: generally stiff/rigid throughout trunk and extremities; poor dissociation of body, limited ability to actively problem-solve movement transition.  Increased pain with movement of L LE, requiring slow, guarded movements throughout     Transfers                 General transfer comment: unsafe/unable to tolerate due to poor sitting balance  Ambulation/Gait             General Gait Details: unsafe/unable to tolerate due to poor sitting balance   Stairs             Wheelchair Mobility    Modified Rankin (Stroke Patients Only)       Balance Overall balance assessment: Needs assistance Sitting-balance support: Feet supported;Bilateral upper extremity supported Sitting balance-Leahy Scale: Zero Sitting balance - Comments: very forward flexed, kyphotic posture; heavy pushing towards R with L UE in closed-chain position. Limited/no awareness of midline, limited/no attempts to self-correct balance. Max assist +1-2 to maintain                                    Cognition Arousal/Alertness: Awake/alert Behavior During Therapy: WFL for tasks assessed/performed Overall Cognitive Status: No family/caregiver present to determine baseline cognitive functioning                                 General Comments: Follows simple commands, but requires frequent redirection to task; generally forgetful and distracted by external environment.  Increased to process and initiate all functional tasks      Exercises Other Exercises Other Exercises: Unsupported sitting, worked on weight shift and positioning to facilitate midline positioning, max assist +2.  Incorporated postural extension and cervical rotation/lateral flexion as able to address head/neck position.  Fatigues very quickly.    General Comments        Pertinent Vitals/Pain Pain Assessment: No/denies pain    Home Living  Prior Function            PT Goals (current goals can now be found in the care plan section) Acute Rehab PT Goals Patient Stated Goal: get stronger PT Goal Formulation: With patient Time For Goal Achievement: 06/26/21 Potential to Achieve Goals: Fair Progress  towards PT goals: Progressing toward goals    Frequency    Min 2X/week      PT Plan Current plan remains appropriate    Co-evaluation              AM-PAC PT "6 Clicks" Mobility   Outcome Measure  Help needed turning from your back to your side while in a flat bed without using bedrails?: Total Help needed moving from lying on your back to sitting on the side of a flat bed without using bedrails?: Total Help needed moving to and from a bed to a chair (including a wheelchair)?: Total Help needed standing up from a chair using your arms (e.g., wheelchair or bedside chair)?: Total Help needed to walk in hospital room?: Total Help needed climbing 3-5 steps with a railing? : Total 6 Click Score: 6    End of Session   Activity Tolerance: Patient limited by fatigue Patient left: in bed;with call bell/phone within reach;with bed alarm set Nurse Communication: Mobility status PT Visit Diagnosis: Muscle weakness (generalized) (M62.81);Difficulty in walking, not elsewhere classified (R26.2);Unsteadiness on feet (R26.81)     Time: 6389-3734 PT Time Calculation (min) (ACUTE ONLY): 19 min  Charges:  $Therapeutic Activity: 8-22 mins                    Arionna Hoggard H. Owens Shark, PT, DPT, NCS 06/19/21, 5:07 PM [phone]

## 2021-06-19 NOTE — Plan of Care (Signed)

## 2021-06-21 DIAGNOSIS — I5032 Chronic diastolic (congestive) heart failure: Secondary | ICD-10-CM | POA: Diagnosis not present

## 2021-06-21 DIAGNOSIS — I10 Essential (primary) hypertension: Secondary | ICD-10-CM | POA: Diagnosis not present

## 2021-06-21 DIAGNOSIS — I25110 Atherosclerotic heart disease of native coronary artery with unstable angina pectoris: Secondary | ICD-10-CM

## 2021-06-21 DIAGNOSIS — R531 Weakness: Secondary | ICD-10-CM | POA: Diagnosis not present

## 2021-06-21 DIAGNOSIS — I4819 Other persistent atrial fibrillation: Secondary | ICD-10-CM | POA: Diagnosis not present

## 2021-06-27 ENCOUNTER — Ambulatory Visit: Payer: Medicare PPO | Admitting: Family

## 2021-06-29 DIAGNOSIS — I5030 Unspecified diastolic (congestive) heart failure: Secondary | ICD-10-CM

## 2021-06-29 DIAGNOSIS — S90821A Blister (nonthermal), right foot, initial encounter: Secondary | ICD-10-CM

## 2021-07-24 DIAGNOSIS — I5032 Chronic diastolic (congestive) heart failure: Secondary | ICD-10-CM | POA: Diagnosis not present

## 2021-07-24 DIAGNOSIS — I2510 Atherosclerotic heart disease of native coronary artery without angina pectoris: Secondary | ICD-10-CM | POA: Diagnosis not present

## 2021-07-24 DIAGNOSIS — M6281 Muscle weakness (generalized): Secondary | ICD-10-CM | POA: Diagnosis not present

## 2021-07-24 DIAGNOSIS — I4819 Other persistent atrial fibrillation: Secondary | ICD-10-CM | POA: Diagnosis not present

## 2021-07-31 ENCOUNTER — Other Ambulatory Visit: Payer: Self-pay

## 2021-07-31 ENCOUNTER — Ambulatory Visit (INDEPENDENT_AMBULATORY_CARE_PROVIDER_SITE_OTHER): Payer: Medicare PPO | Admitting: Vascular Surgery

## 2021-07-31 ENCOUNTER — Encounter (INDEPENDENT_AMBULATORY_CARE_PROVIDER_SITE_OTHER): Payer: Self-pay | Admitting: Vascular Surgery

## 2021-07-31 VITALS — BP 118/80 | HR 118 | Resp 16 | Ht 64.0 in | Wt 178.0 lb

## 2021-07-31 DIAGNOSIS — N1832 Chronic kidney disease, stage 3b: Secondary | ICD-10-CM

## 2021-07-31 DIAGNOSIS — E785 Hyperlipidemia, unspecified: Secondary | ICD-10-CM | POA: Diagnosis not present

## 2021-07-31 DIAGNOSIS — I5022 Chronic systolic (congestive) heart failure: Secondary | ICD-10-CM | POA: Diagnosis not present

## 2021-07-31 DIAGNOSIS — R609 Edema, unspecified: Secondary | ICD-10-CM

## 2021-07-31 DIAGNOSIS — L97201 Non-pressure chronic ulcer of unspecified calf limited to breakdown of skin: Secondary | ICD-10-CM

## 2021-07-31 DIAGNOSIS — I10 Essential (primary) hypertension: Secondary | ICD-10-CM

## 2021-07-31 NOTE — Progress Notes (Signed)
MRN : [id number]  Chris Davis is a 87 y.o. (14-Nov-1932) male who presents with chief complaint of  Chief Complaint  Patient presents with   Follow-up  .  History of Present Illness: Patient returns today in follow up of his leg swelling and ulceration.  His ulcers are improved but not entirely healed.  His swelling is significantly better.  He has been getting Haematologist wraps at his facility for the last several months.  He just had these replaced yesterday so they were not removed today.  His legs feel better.  Overall he seems to be doing reasonably well  Current Outpatient Medications  Medication Sig Dispense Refill   acetaminophen (TYLENOL) 500 MG tablet Take 500 mg by mouth every 6 (six) hours as needed (for pain/headaches.).     apixaban (ELIQUIS) 5 MG TABS tablet Take 1 tablet (5 mg total) by mouth 2 (two) times daily. 60 tablet 0   atorvastatin (LIPITOR) 20 MG tablet Take 20 mg by mouth every evening.     Cetirizine HCl 10 MG CAPS Take by mouth.     Cranberry 500 MG CAPS Take 500 mg by mouth daily.     empagliflozin (JARDIANCE) 10 MG TABS tablet Take 10 mg by mouth daily with breakfast.     magnesium hydroxide (MILK OF MAGNESIA) 400 MG/5ML suspension Take by mouth.     metoprolol succinate (TOPROL-XL) 50 MG 24 hr tablet Take by mouth.     metoprolol tartrate (LOPRESSOR) 50 MG tablet Take 1 tablet (50 mg total) by mouth 2 (two) times daily. 60 tablet 0   Multiple Vitamins-Minerals (MULTIVITAMIN WITH MINERALS) tablet Take 1 tablet by mouth daily.     silver sulfADIAZINE (SILVADENE) 1 % cream Apply topically 2 (two) times daily.     torsemide (DEMADEX) 20 MG tablet Take 1 tablet (20 mg total) by mouth daily. 30 tablet 0   No current facility-administered medications for this visit.    Past Medical History:  Diagnosis Date   Arrhythmia    atrial fibrillation   Atrial fibrillation, persistent (HCC)    on eliquis   Chronic systolic heart failure ([place]) 07/31/2017   Hx of  radiation therapy    Hyperlipidemia    Hypertension    Prostate cancer ([place])    Prostate cancer [facility])     Past Surgical History:  Procedure Laterality Date   CARDIOVERSION N/A 09/10/2017   Procedure: Cardioversion;  Surgeon: Corey Skains, MD;  Location: [facility] ORS;  Service: Cardiovascular;  Laterality: N/A;   CAROTID ARTERY ANGIOPLASTY     CATARACT EXTRACTION Bilateral    INSERTION PROSTATE RADIATION SEED     TONSILLECTOMY       Social History   Tobacco Use   Smoking status: Never   Smokeless tobacco: Never  Substance Use Topics   Alcohol use: Yes    Comment: occ   Drug use: No      Family History  Problem Relation Age of Onset   Breast cancer Sister    Throat cancer Paternal Grandmother      No Known Allergies   REVIEW OF SYSTEMS (Negative unless checked)   Constitutional: '[]'$ Weight loss  '[]'$ Fever  '[]'$ Chills Cardiac: '[]'$ Chest pain   '[]'$ Chest pressure   '[x]'$ Palpitations   '[]'$ Shortness of breath when laying flat   '[]'$ Shortness of breath at rest   '[x]'$ Shortness of breath with exertion. Vascular:  '[]'$ Pain in legs with walking   '[]'$ Pain in legs at rest   '[]'$ Pain in legs when  laying flat   '[]'$ Claudication   '[]'$ Pain in feet when walking  '[]'$ Pain in feet at rest  '[]'$ Pain in feet when laying flat   '[]'$ History of DVT   '[]'$ Phlebitis   '[x]'$ Swelling in legs   '[]'$ Varicose veins   '[x]'$ Non-healing ulcers Pulmonary:   '[]'$ Uses home oxygen   '[]'$ Productive cough   '[]'$ Hemoptysis   '[]'$ Wheeze  '[]'$ COPD   '[]'$ Asthma Neurologic:  '[]'$ Dizziness  '[]'$ Blackouts   '[]'$ Seizures   '[]'$ History of stroke   '[]'$ History of TIA  '[]'$ Aphasia   '[]'$ Temporary blindness   '[]'$ Dysphagia   '[]'$ Weakness or numbness in arms   '[]'$ Weakness or numbness in legs Musculoskeletal:  '[x]'$ Arthritis   '[]'$ Joint swelling   '[]'$ Joint pain   '[]'$ Low back pain Hematologic:  '[]'$ Easy bruising  '[]'$ Easy bleeding   '[]'$ Hypercoagulable state   '[]'$ Anemic  '[]'$ Hepatitis Gastrointestinal:  '[]'$ Blood in stool   '[]'$ Vomiting blood  '[]'$ Gastroesophageal reflux/heartburn   '[]'$ Abdominal  pain Genitourinary:  '[]'$ Chronic kidney disease   '[]'$ Difficult urination  '[]'$ Frequent urination  '[]'$ Burning with urination   '[]'$ Hematuria Skin:  '[]'$ Rashes   '[x]'$ Ulcers   '[x]'$ Wounds Psychological:  '[]'$ History of anxiety   '[]'$  History of major depression.  Physical Examination  BP 118/80 (BP Location: Left Arm)   Pulse (!) 118   Resp 16   Ht '5\' 4"'$  (1.626 m)   Wt 178 lb (80.7 kg)   BMI 30.55 kg/m  Gen:  WD/WN, NAD, elderly white male Head: [place]/AT, No temporalis wasting. Ear/Nose/Throat: Hearing grossly intact, nares w/o erythema or drainage Eyes: Conjunctiva clear. Sclera non-icteric Neck: Supple.  Trachea midline Pulmonary:  Good air movement, no use of accessory muscles.  Cardiac: Tachycardic Vascular:  Vessel Right Left  Radial Palpable Palpable           Musculoskeletal: M/S 5/5 throughout.  No deformity or atrophy.  In a wheelchair.  Legs are currently wrapped in Unna boots.  1+ bilateral lower extremity edema. Neurologic: Sensation grossly intact in extremities.  Symmetrical.  Speech is fluent.  Psychiatric: Judgment intact, Mood & affect appropriate for pt's clinical situation. Dermatologic: The Unna boots are on both legs of the wounds were not assessed today.      Labs Recent Results (from the past 2160 hour(s))  Resp Panel by RT-PCR (Flu A&B, Covid) Nasopharyngeal Swab     Status: None   Collection Time: 06/11/21  8:03 PM   Specimen: Nasopharyngeal Swab; Nasopharyngeal(NP) swabs in vial transport medium  Result Value Ref Range   SARS Coronavirus 2 by RT PCR NEGATIVE NEGATIVE    Comment: (NOTE) SARS-CoV-2 target nucleic acids are NOT DETECTED.  The SARS-CoV-2 RNA is generally detectable in upper respiratory specimens during the acute phase of infection. The lowest concentration of SARS-CoV-2 viral copies this assay can detect is 138 copies/mL. A negative result does not preclude SARS-Cov-2 infection and should not be used as the sole basis for treatment or other patient  management decisions. A negative result may occur with  improper specimen collection/handling, submission of specimen other than nasopharyngeal swab, presence of viral mutation(s) within the areas targeted by this assay, and inadequate number of viral copies(<138 copies/mL). A negative result must be combined with clinical observations, patient history, and epidemiological information. The expected result is Negative.  Fact Sheet for Patients:  EntrepreneurPulse.com.au  Fact Sheet for Healthcare Providers:  IncredibleEmployment.be  This test is no t yet approved or cleared by the [place] FDA and  has been authorized for detection and/or diagnosis of SARS-CoV-2 by FDA under an Emergency Use Authorization (EUA). This  EUA will remain  in effect (meaning this test can be used) for the duration of the COVID-19 declaration under Section 564(b)(1) of the Act, 21 U.S.C.section 360bbb-3(b)(1), unless the authorization is terminated  or revoked sooner.       Influenza A by PCR NEGATIVE NEGATIVE   Influenza B by PCR NEGATIVE NEGATIVE    Comment: (NOTE) The Xpert Xpress SARS-CoV-2/FLU/RSV plus assay is intended as an aid in the diagnosis of influenza from Nasopharyngeal swab specimens and should not be used as a sole basis for treatment. Nasal washings and aspirates are unacceptable for Xpert Xpress SARS-CoV-2/FLU/RSV testing.  Fact Sheet for Patients: EntrepreneurPulse.com.au  Fact Sheet for Healthcare Providers: IncredibleEmployment.be  This test is not yet approved or cleared by the [place] FDA and has been authorized for detection and/or diagnosis of SARS-CoV-2 by FDA under an Emergency Use Authorization (EUA). This EUA will remain in effect (meaning this test can be used) for the duration of the COVID-19 declaration under Section 564(b)(1) of the Act, 21 U.S.C. section 360bbb-3(b)(1), unless the  authorization is terminated or revoked.  Performed at [facility], [address] [place], [place] [address]   Urinalysis, Complete w Microscopic Urine, Clean Catch     Status: Abnormal   Collection Time: 06/11/21  8:03 PM  Result Value Ref Range   Color, Urine YELLOW (A) YELLOW   APPearance CLEAR (A) CLEAR   Specific Gravity, Urine 1.022 1.005 - 1.030   pH 5.0 5.0 - 8.0   Glucose, UA >=500 (A) NEGATIVE mg/dL   Hgb urine dipstick NEGATIVE NEGATIVE   Bilirubin Urine NEGATIVE NEGATIVE   Ketones, ur 5 (A) NEGATIVE mg/dL   Protein, ur NEGATIVE NEGATIVE mg/dL   Nitrite NEGATIVE NEGATIVE   Leukocytes,Ua NEGATIVE NEGATIVE   RBC / HPF 0-5 0 - 5 RBC/hpf   WBC, UA 0-5 0 - 5 WBC/hpf   Bacteria, UA NONE SEEN NONE SEEN   Squamous Epithelial / LPF 0-5 0 - 5   Mucus PRESENT     Comment: Performed at [facility], [address] [place], [place] [address]  CBC with Differential     Status: Abnormal   Collection Time: 06/11/21  8:03 PM  Result Value Ref Range   WBC 7.8 4.0 - 10.5 K/uL   RBC 4.96 4.22 - 5.81 MIL/uL   Hemoglobin 14.7 13.0 - 17.0 g/dL   HCT 44.7 39.0 - 52.0 %   MCV 90.1 80.0 - 100.0 fL   MCH 29.6 26.0 - 34.0 pg   MCHC 32.9 30.0 - 36.0 g/dL   RDW 15.6 (H) 11.5 - 15.5 %   Platelets 238 150 - 400 K/uL   nRBC 0.0 0.0 - 0.2 %   Neutrophils Relative % 74 %   Neutro Abs 5.7 1.7 - 7.7 K/uL   Lymphocytes Relative 10 %   Lymphs Abs 0.8 0.7 - 4.0 K/uL   Monocytes Relative 14 %   Monocytes Absolute 1.1 (H) 0.1 - 1.0 K/uL   Eosinophils Relative 2 %   Eosinophils Absolute 0.2 0.0 - 0.5 K/uL   Basophils Relative 0 %   Basophils Absolute 0.0 0.0 - 0.1 K/uL   Immature Granulocytes 0 %   Abs Immature Granulocytes 0.03 0.00 - 0.07 K/uL    Comment: Performed at [facility], [address], [place], [place] [address]  Comprehensive metabolic panel     Status: Abnormal   Collection Time: 06/11/21  8:03 PM  Result Value Ref Range   Sodium 133 (  L) 135 -  145 mmol/L   Potassium 4.8 3.5 - 5.1 mmol/L   Chloride 104 98 - 111 mmol/L   CO2 23 22 - 32 mmol/L   Glucose, Bld 87 70 - 99 mg/dL    Comment: Glucose reference range applies only to samples taken after fasting for at least 8 hours.   BUN 30 (H) 8 - 23 mg/dL   Creatinine, Ser 1.08 0.61 - 1.24 mg/dL   Calcium 8.8 (L) 8.9 - 10.3 mg/dL   Total Protein 6.8 6.5 - 8.1 g/dL   Albumin 3.7 3.5 - 5.0 g/dL   AST 25 15 - 41 U/L   ALT 17 0 - 44 U/L   Alkaline Phosphatase 91 38 - 126 U/L   Total Bilirubin 1.4 (H) 0.3 - 1.2 mg/dL   GFR, Estimated >60 >60 mL/min    Comment: (NOTE) Calculated using the CKD-EPI Creatinine Equation (2021)    Anion gap 6 5 - 15    Comment: Performed at [facility], [place], [place] [address]  Troponin I (High Sensitivity)     Status: None   Collection Time: 06/11/21  8:03 PM  Result Value Ref Range   Troponin I (High Sensitivity) 7 <18 ng/L    Comment: (NOTE) Elevated high sensitivity troponin I (hsTnI) values and significant  changes across serial measurements may suggest ACS but many other  chronic and acute conditions are known to elevate hsTnI results.  Refer to the "Links" section for chest pain algorithms and additional  guidance. Performed at [facility], [address] [place], [place] [address]   Magnesium     Status: None   Collection Time: 06/11/21  8:03 PM  Result Value Ref Range   Magnesium 2.3 1.7 - 2.4 mg/dL    Comment: Performed at [facility], [address] [place], [place] [address]  Sodium, urine, random     Status: None   Collection Time: 06/11/21  8:03 PM  Result Value Ref Range   Sodium, Ur 39 mmol/L    Comment: Performed at [facility], [address] [place], [place] [address]  Osmolality, urine     Status: None   Collection Time: 06/11/21  8:03 PM  Result Value Ref Range   Osmolality, Ur 685 300 - 900 mOsm/kg    Comment: REPEATED TO VERIFY Performed at [facility], [address], [place], [place] [address]   Brain natriuretic peptide     Status: Abnormal   Collection Time: 06/11/21  8:38 PM  Result Value Ref Range   B Natriuretic Peptide 386.7 (H) 0.0 - 100.0 pg/mL    Comment: Performed at [facility], [address] [place], [place] [address]  Magnesium     Status: None   Collection Time: 06/12/21  6:57 AM  Result Value Ref Range   Magnesium 2.2 1.7 - 2.4 mg/dL    Comment: Performed at [facility], [address] [place], [place] [address]  Comprehensive metabolic panel     Status: Abnormal   Collection Time: 06/12/21  6:57 AM  Result Value Ref Range   Sodium 137 135 - 145 mmol/L   Potassium 4.1 3.5 - 5.1 mmol/L   Chloride 104 98 - 111 mmol/L   CO2 25 22 - 32 mmol/L   Glucose, Bld 88 70 - 99 mg/dL    Comment: Glucose reference range applies only to samples taken after fasting for at least 8 hours.   BUN  26 (H) 8 - 23 mg/dL   Creatinine, Ser 1.05 0.61 - 1.24 mg/dL   Calcium 8.5 (L) 8.9 - 10.3 mg/dL   Total Protein 5.9 (L) 6.5 - 8.1 g/dL   Albumin 3.0 (L) 3.5 - 5.0 g/dL   AST 19 15 - 41 U/L   ALT 13 0 - 44 U/L   Alkaline Phosphatase 77 38 - 126 U/L   Total Bilirubin 1.6 (H) 0.3 - 1.2 mg/dL   GFR, Estimated >60 >60 mL/min    Comment: (NOTE) Calculated using the CKD-EPI Creatinine Equation (2021)    Anion gap 8 5 - 15    Comment: Performed at [facility], [address] [place], [place] [address]  CBC     Status: Abnormal   Collection Time: 06/12/21  6:57 AM  Result Value Ref Range   WBC 6.2 4.0 - 10.5 K/uL   RBC 4.43 4.22 - 5.81 MIL/uL   Hemoglobin 13.4 13.0 - 17.0 g/dL   HCT 39.7 39.0 - 52.0 %   MCV 89.6 80.0 - 100.0 fL   MCH 30.2 26.0 - 34.0 pg   MCHC 33.8 30.0 - 36.0 g/dL   RDW 15.7 (H) 11.5 - 15.5 %   Platelets 209 150 - 400 K/uL   nRBC 0.0 0.0 - 0.2 %    Comment: Performed at [facility], [address] [place], [place] [address]  TSH     Status: None    Collection Time: 06/12/21  6:57 AM  Result Value Ref Range   TSH 1.361 0.350 - 4.500 uIU/mL    Comment: Performed by a 3rd Generation assay with a functional sensitivity of <=0.01 uIU/mL. Performed at [facility], [address] [place], [place] [address]   Methylmalonic acid, serum     Status: None   Collection Time: 06/12/21  6:57 AM  Result Value Ref Range   Methylmalonic Acid, Quantitative 247 0 - 378 nmol/L    Comment: (NOTE) This test was developed and its performance characteristics determined by [facility]. It has not been cleared or approved by the Food and Drug Administration. Performed At: [facility] [place], [place] [id number] Rush Farmer MD [id number]   Procalcitonin - Baseline     Status: None   Collection Time: 06/12/21  6:57 AM  Result Value Ref Range   Procalcitonin <0.10 ng/mL    Comment:        Interpretation: PCT (Procalcitonin) <= 0.5 ng/mL: Systemic infection (sepsis) is not likely. Local bacterial infection is possible. (NOTE)       Sepsis PCT Algorithm           Lower Respiratory Tract                                      Infection PCT Algorithm    ----------------------------     ----------------------------         PCT < 0.25 ng/mL                PCT < 0.10 ng/mL          Strongly encourage             Strongly discourage   discontinuation of antibiotics    initiation of antibiotics    ----------------------------     -----------------------------       PCT 0.25 - 0.50 ng/mL  PCT 0.10 - 0.25 ng/mL               OR       >80% decrease in PCT            Discourage initiation of                                            antibiotics      Encourage discontinuation           of antibiotics    ----------------------------     -----------------------------         PCT >= 0.50 ng/mL              PCT 0.26 - 0.50 ng/mL               AND        <80% decrease in PCT             Encourage initiation of                                              antibiotics       Encourage continuation           of antibiotics    ----------------------------     -----------------------------        PCT >= 0.50 ng/mL                  PCT > 0.50 ng/mL               AND         increase in PCT                  Strongly encourage                                      initiation of antibiotics    Strongly encourage escalation           of antibiotics                                     -----------------------------                                           PCT <= 0.25 ng/mL                                                 OR                                        > 80% decrease in PCT  Discontinue / Do not initiate                                             antibiotics  Performed at [facility], [address] [place], [place] [address]   Phosphorus     Status: None   Collection Time: 06/12/21  6:57 AM  Result Value Ref Range   Phosphorus 3.3 2.5 - 4.6 mg/dL    Comment: Performed at [facility], [address] [place], [place] [address]  Osmolality     Status: None   Collection Time: 06/12/21  6:57 AM  Result Value Ref Range   Osmolality 287 275 - 295 mOsm/kg    Comment: Performed at [facility], [place], [place] [address]  Troponin I (High Sensitivity)     Status: None   Collection Time: 06/12/21  6:57 AM  Result Value Ref Range   Troponin I (High Sensitivity) 10 <18 ng/L    Comment: (NOTE) Elevated high sensitivity troponin I (hsTnI) values and significant  changes across serial measurements may suggest ACS but many other  chronic and acute conditions are known to elevate hsTnI results.  Refer to the "Links" section for chest pain algorithms and additional  guidance. Performed at [facility], [place], [place] [address]   Troponin I (High Sensitivity)     Status: None   Collection Time:  06/12/21 10:37 AM  Result Value Ref Range   Troponin I (High Sensitivity) 10 <18 ng/L    Comment: (NOTE) Elevated high sensitivity troponin I (hsTnI) values and significant  changes across serial measurements may suggest ACS but many other  chronic and acute conditions are known to elevate hsTnI results.  Refer to the "Links" section for chest pain algorithms and additional  guidance. Performed at [facility], [address] [place], [place] [address]   ECHOCARDIOGRAM COMPLETE     Status: None   Collection Time: 06/12/21 12:26 PM  Result Value Ref Range   Weight 2,915.2 oz   Height 68 in   BP 73/48 mmHg   S' Lateral 2.19 cm  CBC with Differential/Platelet     Status: Abnormal   Collection Time: 06/13/21  5:41 AM  Result Value Ref Range   WBC 6.9 4.0 - 10.5 K/uL   RBC 4.54 4.22 - 5.81 MIL/uL   Hemoglobin 13.7 13.0 - 17.0 g/dL   HCT 40.8 39.0 - 52.0 %   MCV 89.9 80.0 - 100.0 fL   MCH 30.2 26.0 - 34.0 pg   MCHC 33.6 30.0 - 36.0 g/dL   RDW 15.6 (H) 11.5 - 15.5 %   Platelets 220 150 - 400 K/uL   nRBC 0.0 0.0 - 0.2 %   Neutrophils Relative % 71 %   Neutro Abs 4.9 1.7 - 7.7 K/uL   Lymphocytes Relative 14 %   Lymphs Abs 1.0 0.7 - 4.0 K/uL   Monocytes Relative 12 %   Monocytes Absolute 0.8 0.1 - 1.0 K/uL   Eosinophils Relative 3 %   Eosinophils Absolute 0.2 0.0 - 0.5 K/uL   Basophils Relative 0 %   Basophils Absolute 0.0 0.0 - 0.1 K/uL   Immature Granulocytes 0 %   Abs Immature Granulocytes 0.03 0.00 - 0.07 K/uL    Comment: Performed at [facility], [address] [place], [place] [address]  Basic metabolic panel     Status: Abnormal   Collection Time: 06/13/21  5:41 AM  Result Value Ref Range   Sodium 134 (L) 135 - 145 mmol/L   Potassium 4.0 3.5 - 5.1 mmol/L   Chloride 105 98 - 111 mmol/L   CO2 21 (L) 22 - 32 mmol/L   Glucose, Bld 113 (H) 70 - 99 mg/dL    Comment: Glucose reference range applies only to samples taken after fasting for at least 8  hours.   BUN 26 (H) 8 - 23 mg/dL   Creatinine, Ser 1.03 0.61 - 1.24 mg/dL   Calcium 8.3 (L) 8.9 - 10.3 mg/dL   GFR, Estimated >60 >60 mL/min    Comment: (NOTE) Calculated using the CKD-EPI Creatinine Equation (2021)    Anion gap 8 5 - 15    Comment: Performed at [facility], [address] [place], [place] [address]  Magnesium     Status: None   Collection Time: 06/13/21  5:41 AM  Result Value Ref Range   Magnesium 2.1 1.7 - 2.4 mg/dL    Comment: Performed at [facility], [address] [place], [place] [address]  Cortisol-am, blood     Status: None   Collection Time: 06/13/21  9:04 AM  Result Value Ref Range   Cortisol - AM 15.0 6.7 - 22.6 ug/dL    Comment: Performed at [facility] [address] [place], [place] [id number]  Basic metabolic panel     Status: Abnormal   Collection Time: 06/15/21  5:24 AM  Result Value Ref Range   Sodium 134 (L) 135 - 145 mmol/L   Potassium 4.8 3.5 - 5.1 mmol/L   Chloride 107 98 - 111 mmol/L   CO2 22 22 - 32 mmol/L   Glucose, Bld 102 (H) 70 - 99 mg/dL    Comment: Glucose reference range applies only to samples taken after fasting for at least 8 hours.   BUN 29 (H) 8 - 23 mg/dL   Creatinine, Ser 0.96 0.61 - 1.24 mg/dL   Calcium 8.4 (L) 8.9 - 10.3 mg/dL   GFR, Estimated >60 >60 mL/min    Comment: (NOTE) Calculated using the CKD-EPI Creatinine Equation (2021)    Anion gap 5 5 - 15    Comment: Performed at [facility], [address] [place], [place] [address]  Magnesium     Status: None   Collection Time: 06/15/21  5:24 AM  Result Value Ref Range   Magnesium 2.1 1.7 - 2.4 mg/dL    Comment: Performed at [facility], [address] [place], [place] [address]  CBC     Status: None   Collection Time: 06/16/21  4:10 AM  Result Value Ref Range   WBC 9.6 4.0 - 10.5 K/uL   RBC 4.39 4.22 - 5.81 MIL/uL   Hemoglobin 13.1 13.0 - 17.0 g/dL   HCT 39.7 39.0 - 52.0 %   MCV 90.4 80.0 - 100.0 fL    MCH 29.8 26.0 - 34.0 pg   MCHC 33.0 30.0 - 36.0 g/dL   RDW 15.5 11.5 - 15.5 %   Platelets 240 150 - 400 K/uL   nRBC 0.0 0.0 - 0.2 %    Comment: Performed at [facility], [address] [place], [place] [id number]  Basic metabolic panel     Status: Abnormal   Collection Time: 06/16/21  4:10 AM  Result Value Ref Range   Sodium 133 (L) 135 -  145 mmol/L   Potassium 4.7 3.5 - 5.1 mmol/L   Chloride 105 98 - 111 mmol/L   CO2 23 22 - 32 mmol/L   Glucose, Bld 121 (H) 70 - 99 mg/dL    Comment: Glucose reference range applies only to samples taken after fasting for at least 8 hours.   BUN 34 (H) 8 - 23 mg/dL   Creatinine, Ser 1.10 0.61 - 1.24 mg/dL   Calcium 8.4 (L) 8.9 - 10.3 mg/dL   GFR, Estimated >60 >60 mL/min    Comment: (NOTE) Calculated using the CKD-EPI Creatinine Equation (2021)    Anion gap 5 5 - 15    Comment: Performed at [facility], [address] [place], [place] [address]  Magnesium     Status: None   Collection Time: 06/16/21  4:10 AM  Result Value Ref Range   Magnesium 2.1 1.7 - 2.4 mg/dL    Comment: Performed at [facility], [address] [place], [place] [id number]  Basic metabolic panel     Status: Abnormal   Collection Time: 06/17/21  4:24 AM  Result Value Ref Range   Sodium 135 135 - 145 mmol/L   Potassium 3.8 3.5 - 5.1 mmol/L   Chloride 105 98 - 111 mmol/L   CO2 23 22 - 32 mmol/L   Glucose, Bld 88 70 - 99 mg/dL    Comment: Glucose reference range applies only to samples taken after fasting for at least 8 hours.   BUN 45 (H) 8 - 23 mg/dL   Creatinine, Ser 0.95 0.61 - 1.24 mg/dL   Calcium 8.3 (L) 8.9 - 10.3 mg/dL   GFR, Estimated >60 >60 mL/min    Comment: (NOTE) Calculated using the CKD-EPI Creatinine Equation (2021)    Anion gap 7 5 - 15    Comment: Performed at [facility], [address] [place], [place] [address]  Magnesium     Status: None   Collection Time: 06/17/21  4:24 AM  Result Value Ref Range    Magnesium 2.1 1.7 - 2.4 mg/dL    Comment: Performed at [facility], [address] [place], [place] [id number]  Basic metabolic panel     Status: Abnormal   Collection Time: 06/18/21  5:12 AM  Result Value Ref Range   Sodium 134 (L) 135 - 145 mmol/L   Potassium 3.2 (L) 3.5 - 5.1 mmol/L   Chloride 103 98 - 111 mmol/L   CO2 23 22 - 32 mmol/L   Glucose, Bld 121 (H) 70 - 99 mg/dL    Comment: Glucose reference range applies only to samples taken after fasting for at least 8 hours.   BUN 54 (H) 8 - 23 mg/dL   Creatinine, Ser 0.99 0.61 - 1.24 mg/dL   Calcium 8.3 (L) 8.9 - 10.3 mg/dL   GFR, Estimated >60 >60 mL/min    Comment: (NOTE) Calculated using the CKD-EPI Creatinine Equation (2021)    Anion gap 8 5 - 15    Comment: Performed at [facility] [facility], [address] [place], [place] [address]  Magnesium     Status: None   Collection Time: 06/18/21  5:12 AM  Result Value Ref Range   Magnesium 2.0 1.7 - 2.4 mg/dL    Comment: Performed at [facility], [address], [place], [place] [address]  CBC with Differential/Platelet     Status: None   Collection Time: 06/18/21  5:12 AM  Result Value Ref Range   WBC 6.8  4.0 - 10.5 K/uL   RBC 4.56 4.22 - 5.81 MIL/uL   Hemoglobin 13.5 13.0 - 17.0 g/dL   HCT 39.9 39.0 - 52.0 %   MCV 87.5 80.0 - 100.0 fL   MCH 29.6 26.0 - 34.0 pg   MCHC 33.8 30.0 - 36.0 g/dL   RDW 15.3 11.5 - 15.5 %   Platelets 254 150 - 400 K/uL   nRBC 0.0 0.0 - 0.2 %   Neutrophils Relative % 70 %   Neutro Abs 4.8 1.7 - 7.7 K/uL   Lymphocytes Relative 10 %   Lymphs Abs 0.7 0.7 - 4.0 K/uL   Monocytes Relative 13 %   Monocytes Absolute 0.9 0.1 - 1.0 K/uL   Eosinophils Relative 5 %   Eosinophils Absolute 0.3 0.0 - 0.5 K/uL   Basophils Relative 1 %   Basophils Absolute 0.1 0.0 - 0.1 K/uL   Immature Granulocytes 1 %   Abs Immature Granulocytes 0.04 0.00 - 0.07 K/uL    Comment: Performed at [facility], [address] [place],  [place] [id number]  Basic metabolic panel     Status: Abnormal   Collection Time: 06/19/21  5:34 AM  Result Value Ref Range   Sodium 135 135 - 145 mmol/L   Potassium 3.7 3.5 - 5.1 mmol/L   Chloride 101 98 - 111 mmol/L   CO2 28 22 - 32 mmol/L   Glucose, Bld 93 70 - 99 mg/dL    Comment: Glucose reference range applies only to samples taken after fasting for at least 8 hours.   BUN 52 (H) 8 - 23 mg/dL   Creatinine, Ser 1.04 0.61 - 1.24 mg/dL   Calcium 8.3 (L) 8.9 - 10.3 mg/dL   GFR, Estimated >60 >60 mL/min    Comment: (NOTE) Calculated using the CKD-EPI Creatinine Equation (2021)    Anion gap 6 5 - 15    Comment: Performed at [facility], [address] [place], [place] [address]  Magnesium     Status: None   Collection Time: 06/19/21  5:34 AM  Result Value Ref Range   Magnesium 2.2 1.7 - 2.4 mg/dL    Comment: Performed at [facility], [address] [place],  [address]    Radiology No results found.  Assessment/Plan Chronic systolic heart failure ([place]) Has been present for many years and is a major contributing factor to his lower extremity swelling.   HTN (hypertension) blood pressure control important in reducing the progression of atherosclerotic disease. On appropriate oral medications.     Stage 3b chronic kidney disease (HCC) A contributing factor to his LE edema.   HLD (hyperlipidemia) lipid control important in reducing the progression of atherosclerotic disease. Continue statin therapy  Lower limb ulcer, calf (HCC) Continue Unna boots to the lower extremities  Peripheral edema Improved with Unna boots.  Continue the Unna boots for the next couple of months at the facility.  Return in 3 months.    Leotis Pain, MD  08/01/2021 10:56 AM    This note was created with Dragon medical transcription system.  Any errors from dictation are purely unintentional

## 2021-08-01 NOTE — Assessment & Plan Note (Signed)
Improved with Unna boots.  Continue the Unna boots for the next couple of months at the facility.  Return in 3 months.

## 2021-08-01 NOTE — Assessment & Plan Note (Signed)
Continue Unna boots to the lower extremities

## 2021-08-02 DIAGNOSIS — H6123 Impacted cerumen, bilateral: Secondary | ICD-10-CM | POA: Diagnosis not present

## 2021-08-24 DIAGNOSIS — I2510 Atherosclerotic heart disease of native coronary artery without angina pectoris: Secondary | ICD-10-CM

## 2021-08-24 DIAGNOSIS — I5030 Unspecified diastolic (congestive) heart failure: Secondary | ICD-10-CM

## 2021-08-24 DIAGNOSIS — M6281 Muscle weakness (generalized): Secondary | ICD-10-CM

## 2021-08-24 DIAGNOSIS — I4891 Unspecified atrial fibrillation: Secondary | ICD-10-CM

## 2021-09-19 DIAGNOSIS — M6281 Muscle weakness (generalized): Secondary | ICD-10-CM | POA: Diagnosis not present

## 2021-09-19 DIAGNOSIS — I2510 Atherosclerotic heart disease of native coronary artery without angina pectoris: Secondary | ICD-10-CM | POA: Diagnosis not present

## 2021-09-19 DIAGNOSIS — I5030 Unspecified diastolic (congestive) heart failure: Secondary | ICD-10-CM | POA: Diagnosis not present

## 2021-09-19 DIAGNOSIS — I4891 Unspecified atrial fibrillation: Secondary | ICD-10-CM | POA: Diagnosis not present

## 2021-10-18 DIAGNOSIS — I5032 Chronic diastolic (congestive) heart failure: Secondary | ICD-10-CM

## 2021-10-18 DIAGNOSIS — I4891 Unspecified atrial fibrillation: Secondary | ICD-10-CM

## 2021-10-18 DIAGNOSIS — R531 Weakness: Secondary | ICD-10-CM

## 2021-10-18 DIAGNOSIS — I2510 Atherosclerotic heart disease of native coronary artery without angina pectoris: Secondary | ICD-10-CM

## 2021-10-30 ENCOUNTER — Other Ambulatory Visit: Payer: Self-pay

## 2021-10-30 ENCOUNTER — Ambulatory Visit (INDEPENDENT_AMBULATORY_CARE_PROVIDER_SITE_OTHER): Payer: Medicare PPO | Admitting: Vascular Surgery

## 2021-10-30 VITALS — BP 114/79 | HR 82 | Ht 69.0 in | Wt 170.0 lb

## 2021-10-30 DIAGNOSIS — N1832 Chronic kidney disease, stage 3b: Secondary | ICD-10-CM

## 2021-10-30 DIAGNOSIS — L97201 Non-pressure chronic ulcer of unspecified calf limited to breakdown of skin: Secondary | ICD-10-CM | POA: Diagnosis not present

## 2021-10-30 DIAGNOSIS — M7989 Other specified soft tissue disorders: Secondary | ICD-10-CM | POA: Diagnosis not present

## 2021-10-30 DIAGNOSIS — E785 Hyperlipidemia, unspecified: Secondary | ICD-10-CM

## 2021-10-30 DIAGNOSIS — I10 Essential (primary) hypertension: Secondary | ICD-10-CM

## 2021-10-30 DIAGNOSIS — I5022 Chronic systolic (congestive) heart failure: Secondary | ICD-10-CM

## 2021-10-30 NOTE — Assessment & Plan Note (Signed)
His swelling is under much better control after several weeks in Unna boots.  At the facility, this seems to be one of the easier ways to control this we will continue doing weekly Unna boots and follow-up in about 3 months.

## 2021-10-30 NOTE — Progress Notes (Signed)
MRN : [id number]  Chris Davis is a 87 y.o. (03-17-1932) male who presents with chief complaint of  Chief Complaint  Patient presents with   Follow-up    3 mo no studies  .  History of Present Illness: Patient returns today in follow up of his leg swelling and ulceration.  He has now been in [employer] for about 3 months and his ulcers have healed.  His swelling is under better control but far from resolved.  The Unna boots seem to be working well and at the facility, doing them once weekly seems to be the best option.  No fevers or chills.  No chest pain or shortness of breath  Current Outpatient Medications  Medication Sig Dispense Refill   acetaminophen (TYLENOL) 500 MG tablet Take 500 mg by mouth every 6 (six) hours as needed (for pain/headaches.).     apixaban (ELIQUIS) 5 MG TABS tablet Take 1 tablet (5 mg total) by mouth 2 (two) times daily. 60 tablet 0   atorvastatin (LIPITOR) 20 MG tablet Take 20 mg by mouth every evening.     Cetirizine HCl 10 MG CAPS Take by mouth.     Cranberry 500 MG CAPS Take 500 mg by mouth daily.     empagliflozin (JARDIANCE) 10 MG TABS tablet Take 10 mg by mouth daily with breakfast.     magnesium hydroxide (MILK OF MAGNESIA) 400 MG/5ML suspension Take by mouth.     metoprolol succinate (TOPROL-XL) 50 MG 24 hr tablet Take by mouth.     metoprolol tartrate (LOPRESSOR) 50 MG tablet Take 1 tablet (50 mg total) by mouth 2 (two) times daily. 60 tablet 0   Multiple Vitamins-Minerals (MULTIVITAMIN WITH MINERALS) tablet Take 1 tablet by mouth daily.     silver sulfADIAZINE (SILVADENE) 1 % cream Apply topically 2 (two) times daily.     torsemide (DEMADEX) 20 MG tablet Take 1 tablet (20 mg total) by mouth daily. 30 tablet 0   No current facility-administered medications for this visit.    Past Medical History:  Diagnosis Date   Arrhythmia    atrial fibrillation   Atrial fibrillation, persistent (HCC)    on eliquis   Chronic systolic heart failure ([place])  07/31/2017   Hx of radiation therapy    Hyperlipidemia    Hypertension    Prostate cancer ([place])    Prostate cancer [facility])     Past Surgical History:  Procedure Laterality Date   CARDIOVERSION N/A 09/10/2017   Procedure: Cardioversion;  Surgeon: Corey Skains, MD;  Location: [facility] ORS;  Service: Cardiovascular;  Laterality: N/A;   CAROTID ARTERY ANGIOPLASTY     CATARACT EXTRACTION Bilateral    INSERTION PROSTATE RADIATION SEED     TONSILLECTOMY       Social History   Tobacco Use   Smoking status: Never   Smokeless tobacco: Never  Substance Use Topics   Alcohol use: Yes    Comment: occ   Drug use: No       Family History  Problem Relation Age of Onset   Breast cancer Sister    Throat cancer Paternal Grandmother      No Known Allergies  REVIEW OF SYSTEMS (Negative unless checked)   Constitutional: [] Weight loss  [] Fever  [] Chills Cardiac: [] Chest pain   [] Chest pressure   [x] Palpitations   [] Shortness of breath when laying flat   [] Shortness of breath at rest   [x] Shortness of breath with exertion. Vascular:  [] Pain in legs with walking   []   Pain in legs at rest   [] Pain in legs when laying flat   [] Claudication   [] Pain in feet when walking  [] Pain in feet at rest  [] Pain in feet when laying flat   [] History of DVT   [] Phlebitis   [x] Swelling in legs   [] Varicose veins   [x] Non-healing ulcers Pulmonary:   [] Uses home oxygen   [] Productive cough   [] Hemoptysis   [] Wheeze  [] COPD   [] Asthma Neurologic:  [] Dizziness  [] Blackouts   [] Seizures   [] History of stroke   [] History of TIA  [] Aphasia   [] Temporary blindness   [] Dysphagia   [] Weakness or numbness in arms   [] Weakness or numbness in legs Musculoskeletal:  [x] Arthritis   [] Joint swelling   [] Joint pain   [] Low back pain Hematologic:  [] Easy bruising  [] Easy bleeding   [] Hypercoagulable state   [] Anemic  [] Hepatitis Gastrointestinal:  [] Blood in stool   [] Vomiting blood  [] Gastroesophageal reflux/heartburn    [] Abdominal pain Genitourinary:  [] Chronic kidney disease   [] Difficult urination  [] Frequent urination  [] Burning with urination   [] Hematuria Skin:  [] Rashes   [x] Ulcers   [x] Wounds Psychological:  [] History of anxiety   []  History of major depression.  Physical Examination  BP 114/79   Pulse 82   Ht 5\' 9"  (1.753 m)   Wt 170 lb (77.1 kg)   BMI 25.10 kg/m  Gen:  WD/WN, NAD Head: [place]/AT, No temporalis wasting. Ear/Nose/Throat: Hearing grossly intact, nares w/o erythema or drainage Eyes: Conjunctiva clear. Sclera non-icteric Neck: Supple.  Trachea midline Pulmonary:  Good air movement, no use of accessory muscles.  Cardiac: Irregular Vascular:  Vessel Right Left  Radial Palpable Palpable               Musculoskeletal: M/S 5/5 throughout.  No deformity or atrophy.  1+ bilateral lower extremity edema. Neurologic: Sensation grossly intact in extremities.  Symmetrical.  Speech is fluent.  Psychiatric: Judgment intact, Mood & affect appropriate for pt's clinical situation. Dermatologic: No rashes or ulcers noted.  No cellulitis or open wounds.      Labs No results found for this or any previous visit (from the past 2160 hour(s)).  Radiology No results found.  Assessment/Plan Chronic systolic heart failure ([place]) Has been present for many years and is a major contributing factor to his lower extremity swelling.   HTN (hypertension) blood pressure control important in reducing the progression of atherosclerotic disease. On appropriate oral medications.     Stage 3b chronic kidney disease (HCC) A contributing factor to his LE edema.   HLD (hyperlipidemia) lipid control important in reducing the progression of atherosclerotic disease. Continue statin therapy  Swelling of limb His swelling is under much better control after several weeks in Unna boots.  At the facility, this seems to be one of the easier ways to control this we will continue doing weekly Unna boots and  follow-up in about 3 months.  Lower limb ulcer, calf ([place]) Now healed with Louretta [place] boots    Leotis Pain, MD  10/30/2021 3:32 PM    This note was created with Dragon medical transcription system.  Any errors from dictation are purely unintentional

## 2021-10-30 NOTE — Assessment & Plan Note (Signed)
Now healed with Unna boots

## 2021-12-21 DIAGNOSIS — I2510 Atherosclerotic heart disease of native coronary artery without angina pectoris: Secondary | ICD-10-CM | POA: Diagnosis not present

## 2021-12-21 DIAGNOSIS — I4891 Unspecified atrial fibrillation: Secondary | ICD-10-CM | POA: Diagnosis not present

## 2021-12-21 DIAGNOSIS — M6281 Muscle weakness (generalized): Secondary | ICD-10-CM | POA: Diagnosis not present

## 2021-12-21 DIAGNOSIS — I5030 Unspecified diastolic (congestive) heart failure: Secondary | ICD-10-CM | POA: Diagnosis not present

## 2022-01-02 DIAGNOSIS — G9332 Myalgic encephalomyelitis/chronic fatigue syndrome: Secondary | ICD-10-CM

## 2022-01-03 DIAGNOSIS — G479 Sleep disorder, unspecified: Secondary | ICD-10-CM | POA: Diagnosis not present

## 2022-01-03 DIAGNOSIS — I959 Hypotension, unspecified: Secondary | ICD-10-CM | POA: Diagnosis not present

## 2022-01-29 ENCOUNTER — Ambulatory Visit (INDEPENDENT_AMBULATORY_CARE_PROVIDER_SITE_OTHER): Payer: Medicare PPO | Admitting: Vascular Surgery

## 2022-01-29 ENCOUNTER — Other Ambulatory Visit: Payer: Self-pay

## 2022-01-29 VITALS — BP 98/71 | HR 83 | Resp 14 | Wt 194.8 lb

## 2022-01-29 DIAGNOSIS — I5022 Chronic systolic (congestive) heart failure: Secondary | ICD-10-CM | POA: Diagnosis not present

## 2022-01-29 DIAGNOSIS — I10 Essential (primary) hypertension: Secondary | ICD-10-CM

## 2022-01-29 DIAGNOSIS — N1832 Chronic kidney disease, stage 3b: Secondary | ICD-10-CM

## 2022-01-29 DIAGNOSIS — E785 Hyperlipidemia, unspecified: Secondary | ICD-10-CM | POA: Diagnosis not present

## 2022-01-29 DIAGNOSIS — M7989 Other specified soft tissue disorders: Secondary | ICD-10-CM | POA: Diagnosis not present

## 2022-01-29 NOTE — Assessment & Plan Note (Signed)
Symptoms are well controlled with wraps which she is tolerating well.  This facility is doing an excellent job with these.  I would continue them given his overall state and the good results we have had from them.  We will extend his follow-up to 6 months at this point.

## 2022-01-29 NOTE — Progress Notes (Signed)
MRN : [id number]  Chris Davis is a 88 y.o. ([birth year]) male who presents with chief complaint of  Chief Complaint  Patient presents with   Follow-up    3 month follow up  .  History of Present Illness: Patient returns today in follow up of his leg swelling.  He has been getting weekly wraps at his facility and this has resulted in marked improvement in his leg swelling.  He really has no appreciable swelling today although his legs are currently wrapped.  No pain.  No ulceration.  Overall doing well.  Current Outpatient Medications  Medication Sig Dispense Refill   acetaminophen (TYLENOL) 500 MG tablet Take 500 mg by mouth every 6 (six) hours as needed (for pain/headaches.).     apixaban (ELIQUIS) 5 MG TABS tablet Take 1 tablet (5 mg total) by mouth 2 (two) times daily. 60 tablet 0   atorvastatin (LIPITOR) 20 MG tablet Take 20 mg by mouth every evening.     Cetirizine HCl 10 MG CAPS Take by mouth.     Cranberry 500 MG CAPS Take 500 mg by mouth daily.     empagliflozin (JARDIANCE) 10 MG TABS tablet Take 10 mg by mouth daily with breakfast.     magnesium hydroxide (MILK OF MAGNESIA) 400 MG/5ML suspension Take by mouth.     metoprolol succinate (TOPROL-XL) 50 MG 24 hr tablet Take by mouth.     metoprolol tartrate (LOPRESSOR) 50 MG tablet Take 1 tablet (50 mg total) by mouth 2 (two) times daily. 60 tablet 0   Multiple Vitamins-Minerals (MULTIVITAMIN WITH MINERALS) tablet Take 1 tablet by mouth daily.     silver sulfADIAZINE (SILVADENE) 1 % cream Apply topically 2 (two) times daily.     torsemide (DEMADEX) 20 MG tablet Take 1 tablet (20 mg total) by mouth daily. 30 tablet 0   traZODone (DESYREL) 50 MG tablet Take by mouth.     No current facility-administered medications for this visit.    Past Medical History:  Diagnosis Date   Arrhythmia    atrial fibrillation   Atrial fibrillation, persistent (HCC)    on eliquis   Chronic systolic heart failure ([place]) 07/31/2017   Hx of  radiation therapy    Hyperlipidemia    Hypertension    Prostate cancer ([place])    Prostate cancer [facility])     Past Surgical History:  Procedure Laterality Date   CARDIOVERSION N/A 09/10/2017   Procedure: Cardioversion;  Surgeon: Corey Skains, MD;  Location: [facility] ORS;  Service: Cardiovascular;  Laterality: N/A;   CAROTID ARTERY ANGIOPLASTY     CATARACT EXTRACTION Bilateral    INSERTION PROSTATE RADIATION SEED     TONSILLECTOMY       Social History   Tobacco Use   Smoking status: Never   Smokeless tobacco: Never  Substance Use Topics   Alcohol use: Yes    Comment: occ   Drug use: No      Family History  Problem Relation Age of Onset   Breast cancer Sister    Throat cancer Paternal Grandmother      No Known Allergies  REVIEW OF SYSTEMS (Negative unless checked)   Constitutional: [] Weight loss  [] Fever  [] Chills Cardiac: [] Chest pain   [] Chest pressure   [x] Palpitations   [] Shortness of breath when laying flat   [] Shortness of breath at rest   [x] Shortness of breath with exertion. Vascular:  [] Pain in legs with walking   [] Pain in legs at rest   [] Pain  in legs when laying flat   [] Claudication   [] Pain in feet when walking  [] Pain in feet at rest  [] Pain in feet when laying flat   [] History of DVT   [] Phlebitis   [x] Swelling in legs   [] Varicose veins   [x] Non-healing ulcers Pulmonary:   [] Uses home oxygen   [] Productive cough   [] Hemoptysis   [] Wheeze  [] COPD   [] Asthma Neurologic:  [] Dizziness  [] Blackouts   [] Seizures   [] History of stroke   [] History of TIA  [] Aphasia   [] Temporary blindness   [] Dysphagia   [] Weakness or numbness in arms   [] Weakness or numbness in legs Musculoskeletal:  [x] Arthritis   [] Joint swelling   [] Joint pain   [] Low back pain Hematologic:  [] Easy bruising  [] Easy bleeding   [] Hypercoagulable state   [] Anemic  [] Hepatitis Gastrointestinal:  [] Blood in stool   [] Vomiting blood  [] Gastroesophageal reflux/heartburn   [] Abdominal  pain Genitourinary:  [] Chronic kidney disease   [] Difficult urination  [] Frequent urination  [] Burning with urination   [] Hematuria Skin:  [] Rashes   [x] Ulcers   [x] Wounds Psychological:  [] History of anxiety   []  History of major depression.  Physical Examination  BP 98/71 (BP Location: Left Arm)    Pulse 83    Resp 14    Wt 194 lb 12.8 oz (88.4 kg)    BMI 28.77 kg/m  Gen:  WD/WN, NAD Head: [place]/AT, No temporalis wasting. Ear/Nose/Throat: Hearing grossly intact, nares w/o erythema or drainage Eyes: Conjunctiva clear. Sclera non-icteric Neck: Supple.  Trachea midline Pulmonary:  Good air movement, no use of accessory muscles.  Cardiac: Irregular Vascular:  Vessel Right Left  Radial Palpable Palpable       Musculoskeletal: M/S 5/5 throughout.  No deformity or atrophy.  Legs are currently wrapped but there is not appreciable edema.  In a wheelchair. Neurologic: Sensation grossly intact in extremities.  Symmetrical.  Speech is fluent.  Psychiatric: Judgment intact, Mood & affect appropriate for pt's clinical situation. Dermatologic: No rashes or ulcers noted.  No cellulitis or open wounds.      Labs No results found for this or any previous visit (from the past 2160 hour(s)).  Radiology No results found.  Assessment/Plan Chronic systolic heart failure ([place]) Has been present for many years and is a major contributing factor to his lower extremity swelling.   HTN (hypertension) blood pressure control important in reducing the progression of atherosclerotic disease. On appropriate oral medications.     Stage 3b chronic kidney disease (HCC) A contributing factor to his LE edema.   HLD (hyperlipidemia) lipid control important in reducing the progression of atherosclerotic disease. Continue statin therapy  Swelling of limb Symptoms are well controlled with wraps which she is tolerating well.  This facility is doing an excellent job with these.  I would continue them given his  overall state and the good results we have had from them.  We will extend his follow-up to 6 months at this point.    Leotis Pain, MD  01/29/2022 4:09 PM    This note was created with Dragon medical transcription system.  Any errors from dictation are purely unintentional

## 2022-03-07 DIAGNOSIS — I5032 Chronic diastolic (congestive) heart failure: Secondary | ICD-10-CM | POA: Diagnosis not present

## 2022-03-07 DIAGNOSIS — I2510 Atherosclerotic heart disease of native coronary artery without angina pectoris: Secondary | ICD-10-CM | POA: Diagnosis not present

## 2022-03-07 DIAGNOSIS — I482 Chronic atrial fibrillation, unspecified: Secondary | ICD-10-CM | POA: Diagnosis not present

## 2022-03-07 DIAGNOSIS — R5381 Other malaise: Secondary | ICD-10-CM | POA: Diagnosis not present

## 2022-03-18 DIAGNOSIS — G479 Sleep disorder, unspecified: Secondary | ICD-10-CM | POA: Diagnosis not present

## 2022-03-20 DIAGNOSIS — I4819 Other persistent atrial fibrillation: Secondary | ICD-10-CM | POA: Diagnosis not present

## 2022-03-20 DIAGNOSIS — I5022 Chronic systolic (congestive) heart failure: Secondary | ICD-10-CM | POA: Diagnosis not present

## 2022-03-20 DIAGNOSIS — E7800 Pure hypercholesterolemia, unspecified: Secondary | ICD-10-CM | POA: Diagnosis not present

## 2022-03-20 DIAGNOSIS — I10 Essential (primary) hypertension: Secondary | ICD-10-CM | POA: Diagnosis not present

## 2022-04-19 DIAGNOSIS — I4891 Unspecified atrial fibrillation: Secondary | ICD-10-CM | POA: Diagnosis not present

## 2022-04-19 DIAGNOSIS — I2510 Atherosclerotic heart disease of native coronary artery without angina pectoris: Secondary | ICD-10-CM | POA: Diagnosis not present

## 2022-04-19 DIAGNOSIS — I5030 Unspecified diastolic (congestive) heart failure: Secondary | ICD-10-CM | POA: Diagnosis not present

## 2022-04-19 DIAGNOSIS — R531 Weakness: Secondary | ICD-10-CM | POA: Diagnosis not present

## 2022-06-17 DIAGNOSIS — I5032 Chronic diastolic (congestive) heart failure: Secondary | ICD-10-CM | POA: Diagnosis not present

## 2022-06-17 DIAGNOSIS — I482 Chronic atrial fibrillation, unspecified: Secondary | ICD-10-CM | POA: Diagnosis not present

## 2022-06-17 DIAGNOSIS — R5381 Other malaise: Secondary | ICD-10-CM | POA: Diagnosis not present

## 2022-06-17 DIAGNOSIS — I2510 Atherosclerotic heart disease of native coronary artery without angina pectoris: Secondary | ICD-10-CM | POA: Diagnosis not present

## 2022-06-20 DIAGNOSIS — E871 Hypo-osmolality and hyponatremia: Secondary | ICD-10-CM | POA: Diagnosis not present

## 2022-06-20 DIAGNOSIS — I110 Hypertensive heart disease with heart failure: Secondary | ICD-10-CM | POA: Diagnosis not present

## 2022-06-20 DIAGNOSIS — I2510 Atherosclerotic heart disease of native coronary artery without angina pectoris: Secondary | ICD-10-CM | POA: Diagnosis not present

## 2022-06-20 DIAGNOSIS — I4819 Other persistent atrial fibrillation: Secondary | ICD-10-CM | POA: Diagnosis not present

## 2022-06-20 LAB — BASIC METABOLIC PANEL
BUN: 37 — AB (ref 4–21)
CO2: 30 — AB (ref 13–22)
Chloride: 101 (ref 99–108)
Creatinine: 1.4 — AB (ref 0.6–1.3)
Glucose: 74
Potassium: 4.1 mEq/L (ref 3.5–5.1)
Sodium: 138 (ref 137–147)

## 2022-06-20 LAB — COMPREHENSIVE METABOLIC PANEL
Calcium: 8.8 (ref 8.7–10.7)
eGFR: 49

## 2022-06-28 DIAGNOSIS — M545 Low back pain, unspecified: Secondary | ICD-10-CM | POA: Diagnosis not present

## 2022-07-26 DIAGNOSIS — M545 Low back pain, unspecified: Secondary | ICD-10-CM | POA: Diagnosis not present

## 2022-07-30 ENCOUNTER — Ambulatory Visit (INDEPENDENT_AMBULATORY_CARE_PROVIDER_SITE_OTHER): Payer: Medicare PPO | Admitting: Vascular Surgery

## 2022-08-16 IMAGING — CT CT HEAD W/O CM
4 series · 15 of 47 positions shown, 17 images · non-contrast
Comparison: 08/15/2020

CLINICAL DATA: Minor head trauma

EXAM:
CT HEAD WITHOUT CONTRAST
TECHNIQUE: Contiguous axial images were obtained from the base of the skull
through the vertex without intravenous contrast.

[Series 2: ax head wo · axial · 0.34mm/px · z∈[-205,-66]mm · 7 of 39 slices shown, 9 images]
[im 5/39  brain]
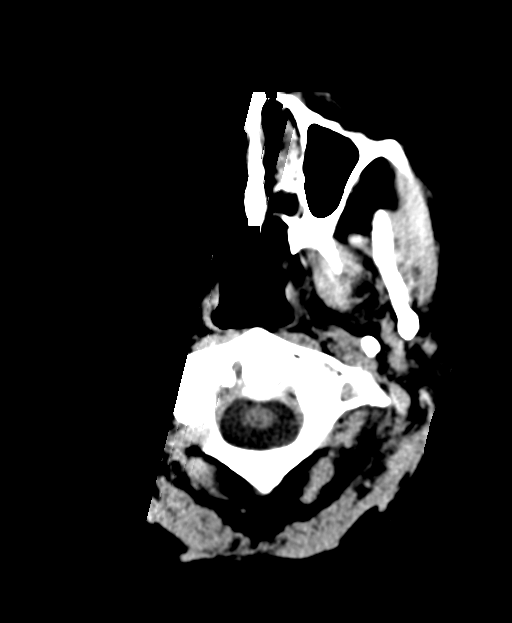
[im 5/39  bone]
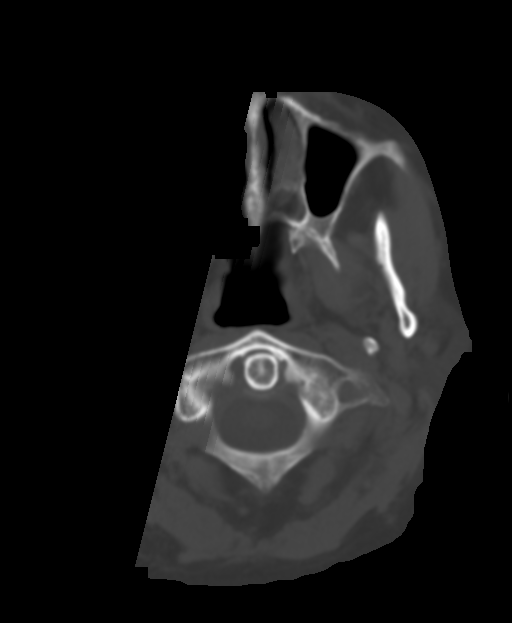
[im 10/39  brain]
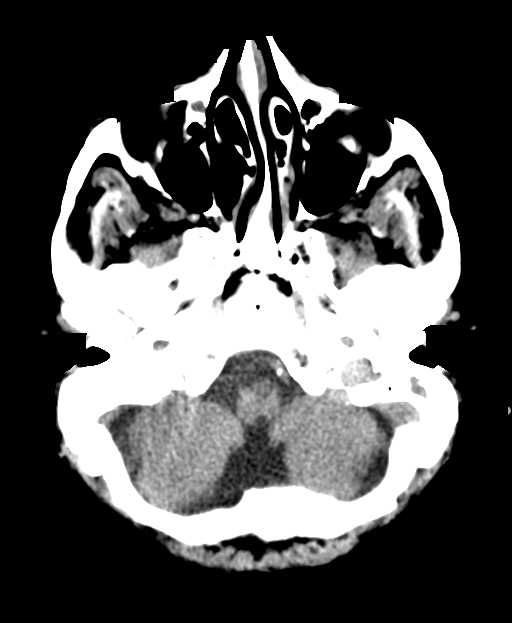
[im 15/39  brain]
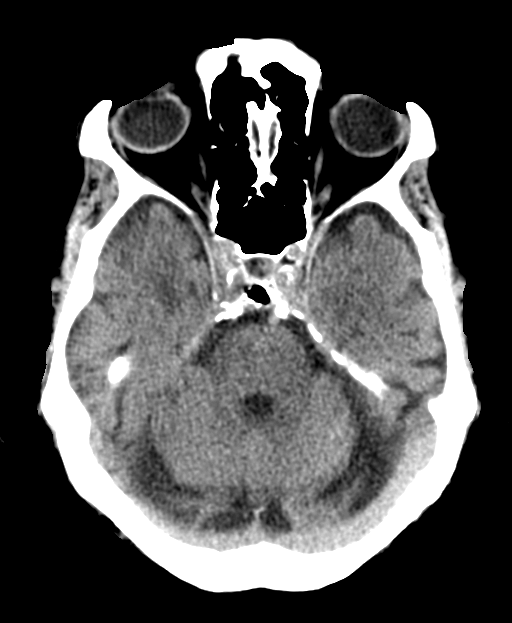
[im 20/39  brain]
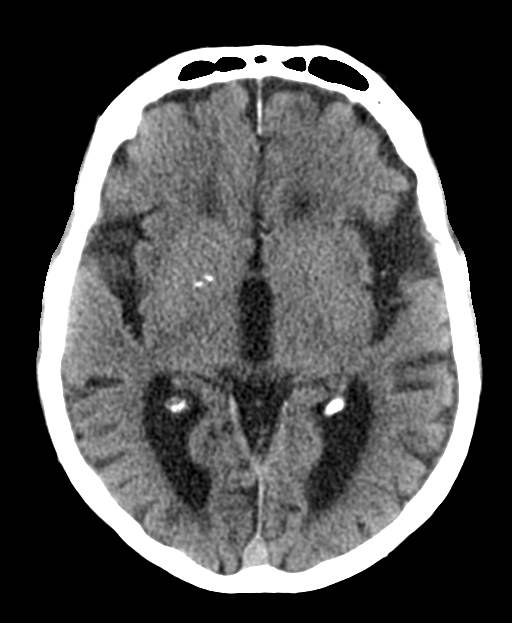
[im 24/39  brain]
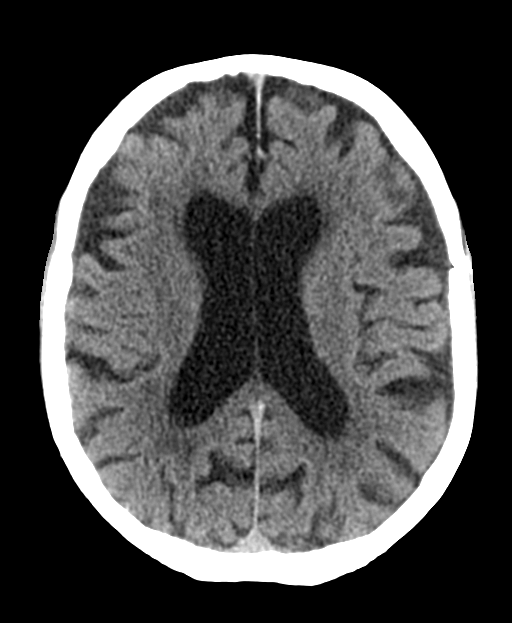
[im 24/39  bone]
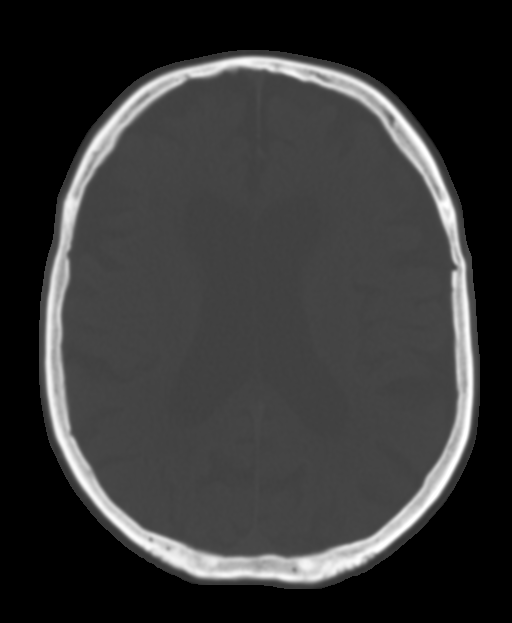
[im 29/39  brain]
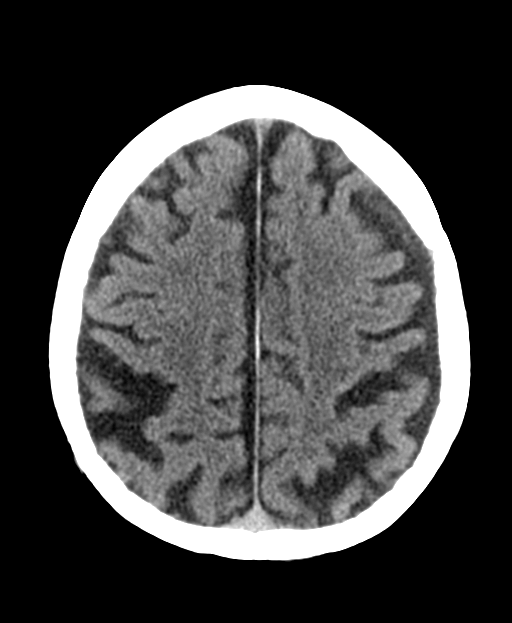
[im 34/39  brain]
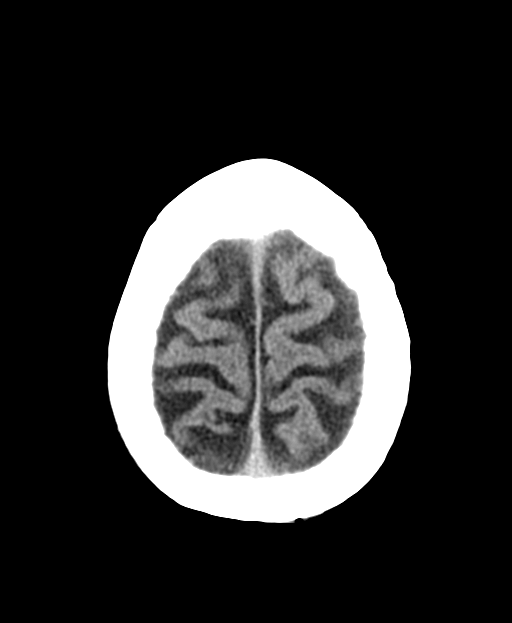

[Series 3: head bone · axial · 0.43mm/px · z∈[-161,-143]mm · 2 of 86 slices shown]
[im 9/86  bone]
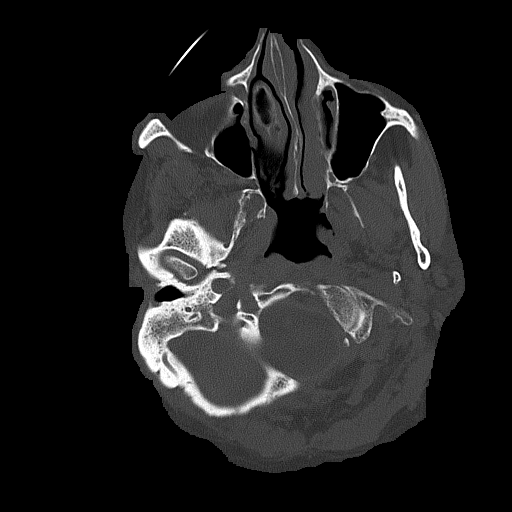
[im 18/86  bone]
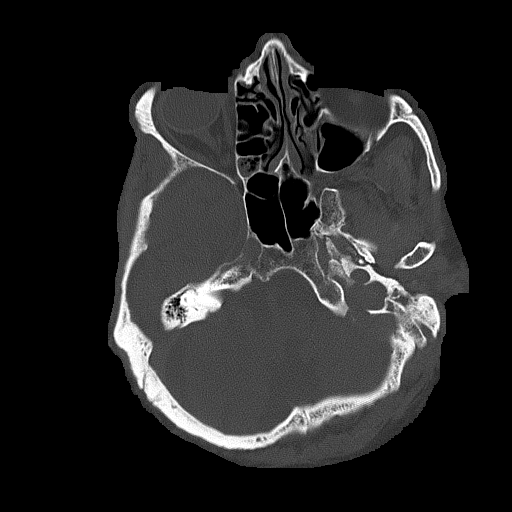

[Series 4: coronal soft tissue · coronal · 0.34mm/px · 3 of 70 slices shown]
[im 24/70  brain]
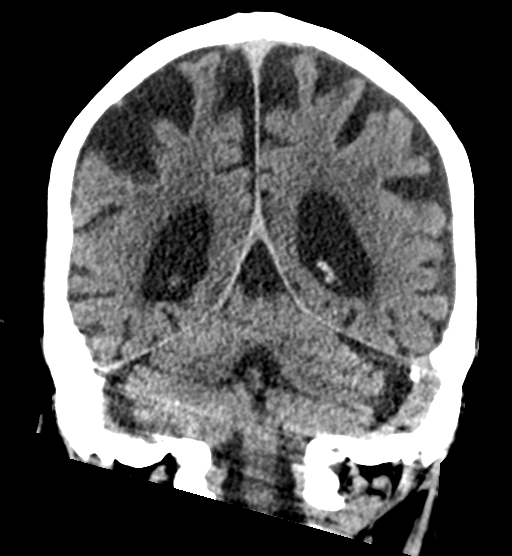
[im 31/70  brain]
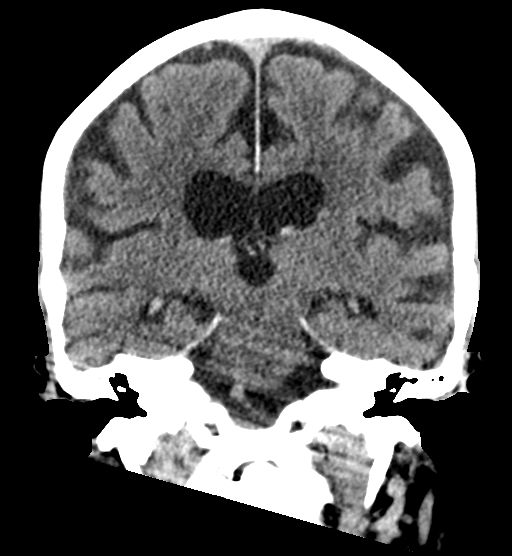
[im 39/70  brain]
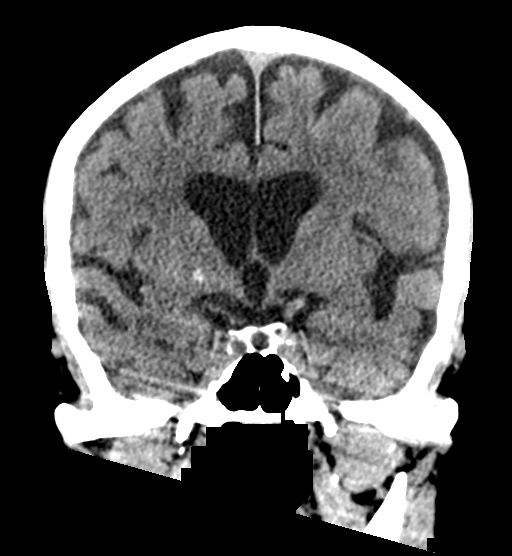

[Series 5: sagittal soft tissue · sagittal · 0.37mm/px · 3 of 58 slices shown]
[im 25/58  brain]
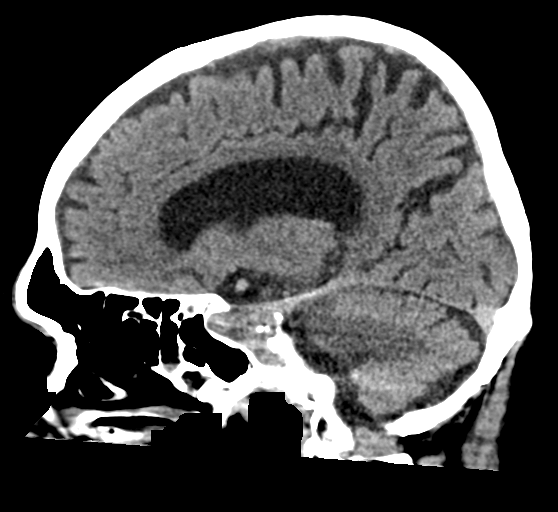
[im 29/58  brain]
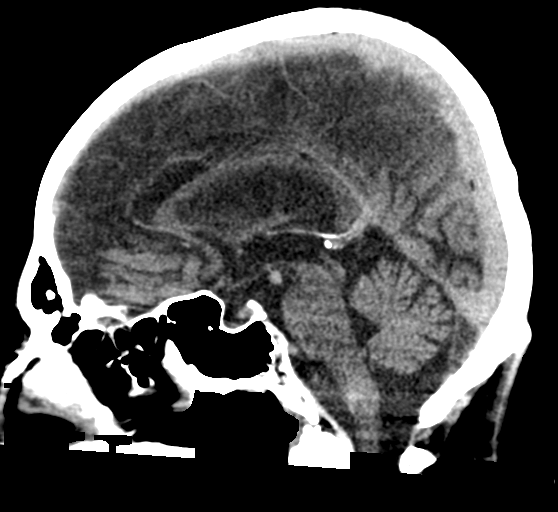
[im 33/58  brain]
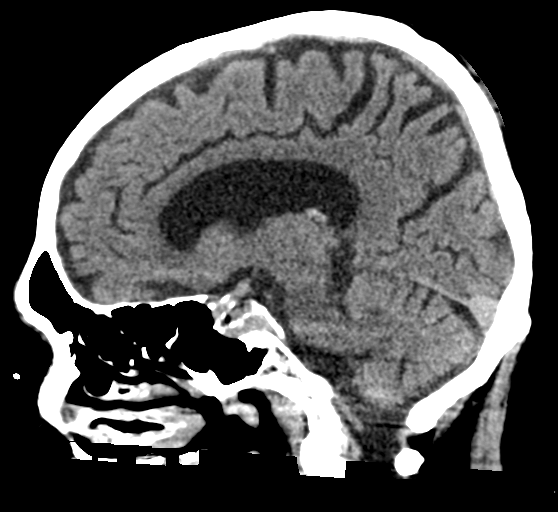

[15 of 47 positions shown; findings below may reference images not displayed]

FINDINGS: Brain: No evidence of acute infarction, hemorrhage, hydrocephalus,
extra-axial collection or mass lesion/mass effect. Prominent and
generalized atrophy.

Vascular: No hyperdense vessel or unexpected calcification.

Skull: No fracture.

Sinuses/Orbits: No evidence of injury.
IMPRESSION: No evidence of intracranial injury.

## 2022-08-26 DIAGNOSIS — I5032 Chronic diastolic (congestive) heart failure: Secondary | ICD-10-CM | POA: Diagnosis not present

## 2022-08-26 DIAGNOSIS — I482 Chronic atrial fibrillation, unspecified: Secondary | ICD-10-CM | POA: Diagnosis not present

## 2022-08-26 DIAGNOSIS — I2510 Atherosclerotic heart disease of native coronary artery without angina pectoris: Secondary | ICD-10-CM | POA: Diagnosis not present

## 2022-08-26 DIAGNOSIS — R5381 Other malaise: Secondary | ICD-10-CM | POA: Diagnosis not present

## 2022-08-26 DIAGNOSIS — M159 Polyosteoarthritis, unspecified: Secondary | ICD-10-CM | POA: Diagnosis not present

## 2022-08-30 ENCOUNTER — Ambulatory Visit (INDEPENDENT_AMBULATORY_CARE_PROVIDER_SITE_OTHER): Payer: Medicare PPO | Admitting: Vascular Surgery

## 2022-08-30 ENCOUNTER — Encounter (INDEPENDENT_AMBULATORY_CARE_PROVIDER_SITE_OTHER): Payer: Self-pay | Admitting: Vascular Surgery

## 2022-08-30 VITALS — BP 109/71 | HR 89 | Resp 16

## 2022-08-30 DIAGNOSIS — I10 Essential (primary) hypertension: Secondary | ICD-10-CM | POA: Diagnosis not present

## 2022-08-30 DIAGNOSIS — E785 Hyperlipidemia, unspecified: Secondary | ICD-10-CM

## 2022-08-30 DIAGNOSIS — N1832 Chronic kidney disease, stage 3b: Secondary | ICD-10-CM | POA: Diagnosis not present

## 2022-08-30 DIAGNOSIS — I5022 Chronic systolic (congestive) heart failure: Secondary | ICD-10-CM

## 2022-08-30 DIAGNOSIS — M7989 Other specified soft tissue disorders: Secondary | ICD-10-CM | POA: Diagnosis not present

## 2022-08-30 NOTE — Progress Notes (Signed)
MRN : [id number]  Chris Davis is a 88 y.o. (1932/01/12) male who presents with chief complaint of  Chief Complaint  Patient presents with   Follow-up    6 month follow up  .  History of Present Illness: Patient returns today in follow up.  He is working with physical therapy but really walking minimally at this point.  His wife says he is a fall risk.  His swelling is actually still very mild even though has not been wearing his zippered compression hose, elevating his legs, or walking much.  He does have some swelling on the left but again it is fairly mild.  His back is hurting him more than anything.  His legs seem to be doing okay.  Current Outpatient Medications  Medication Sig Dispense Refill   acetaminophen (TYLENOL) 500 MG tablet Take 500 mg by mouth every 6 (six) hours as needed (for pain/headaches.).     apixaban (ELIQUIS) 5 MG TABS tablet Take 1 tablet (5 mg total) by mouth 2 (two) times daily. 60 tablet 0   atorvastatin (LIPITOR) 20 MG tablet Take 20 mg by mouth every evening.     Cetirizine HCl 10 MG CAPS Take by mouth.     Cranberry 500 MG CAPS Take 500 mg by mouth daily.     empagliflozin (JARDIANCE) 10 MG TABS tablet Take 10 mg by mouth daily with breakfast.     magnesium hydroxide (MILK OF MAGNESIA) 400 MG/5ML suspension Take by mouth.     metoprolol succinate (TOPROL-XL) 50 MG 24 hr tablet Take by mouth.     metoprolol tartrate (LOPRESSOR) 50 MG tablet Take 1 tablet (50 mg total) by mouth 2 (two) times daily. 60 tablet 0   Multiple Vitamins-Minerals (MULTIVITAMIN WITH MINERALS) tablet Take 1 tablet by mouth daily.     silver sulfADIAZINE (SILVADENE) 1 % cream Apply topically 2 (two) times daily.     torsemide (DEMADEX) 20 MG tablet Take 1 tablet (20 mg total) by mouth daily. 30 tablet 0   traZODone (DESYREL) 50 MG tablet Take by mouth.     No current facility-administered medications for this visit.    Past Medical History:  Diagnosis Date   Arrhythmia    atrial  fibrillation   Atrial fibrillation, persistent (HCC)    on eliquis   Chronic systolic heart failure ([place]) 07/31/2017   Hx of radiation therapy    Hyperlipidemia    Hypertension    Prostate cancer ([place])    Prostate cancer [facility])     Past Surgical History:  Procedure Laterality Date   CARDIOVERSION N/A 09/10/2017   Procedure: Cardioversion;  Surgeon: Corey Skains, MD;  Location: [facility] ORS;  Service: Cardiovascular;  Laterality: N/A;   CAROTID ARTERY ANGIOPLASTY     CATARACT EXTRACTION Bilateral    INSERTION PROSTATE RADIATION SEED     TONSILLECTOMY       Social History   Tobacco Use   Smoking status: Never   Smokeless tobacco: Never  Substance Use Topics   Alcohol use: Yes    Comment: occ   Drug use: No       Family History  Problem Relation Age of Onset   Breast cancer Sister    Throat cancer Paternal Grandmother      No Known Allergies  REVIEW OF SYSTEMS (Negative unless checked)   Constitutional: '[]'$ Weight loss  '[]'$ Fever  '[]'$ Chills Cardiac: '[]'$ Chest pain   '[]'$ Chest pressure   '[x]'$ Palpitations   '[]'$ Shortness of breath when laying flat   '[]'$   Shortness of breath at rest   '[x]'$ Shortness of breath with exertion. Vascular:  '[]'$ Pain in legs with walking   '[]'$ Pain in legs at rest   '[]'$ Pain in legs when laying flat   '[]'$ Claudication   '[]'$ Pain in feet when walking  '[]'$ Pain in feet at rest  '[]'$ Pain in feet when laying flat   '[]'$ History of DVT   '[]'$ Phlebitis   '[x]'$ Swelling in legs   '[]'$ Varicose veins   '[x]'$ Non-healing ulcers Pulmonary:   '[]'$ Uses home oxygen   '[]'$ Productive cough   '[]'$ Hemoptysis   '[]'$ Wheeze  '[]'$ COPD   '[]'$ Asthma Neurologic:  '[]'$ Dizziness  '[]'$ Blackouts   '[]'$ Seizures   '[]'$ History of stroke   '[]'$ History of TIA  '[]'$ Aphasia   '[]'$ Temporary blindness   '[]'$ Dysphagia   '[]'$ Weakness or numbness in arms   '[]'$ Weakness or numbness in legs Musculoskeletal:  '[x]'$ Arthritis   '[]'$ Joint swelling   '[]'$ Joint pain   '[]'$ Low back pain Hematologic:  '[]'$ Easy bruising  '[]'$ Easy bleeding   '[]'$ Hypercoagulable state   '[]'$ Anemic   '[]'$ Hepatitis Gastrointestinal:  '[]'$ Blood in stool   '[]'$ Vomiting blood  '[]'$ Gastroesophageal reflux/heartburn   '[]'$ Abdominal pain Genitourinary:  '[]'$ Chronic kidney disease   '[]'$ Difficult urination  '[]'$ Frequent urination  '[]'$ Burning with urination   '[]'$ Hematuria Skin:  '[]'$ Rashes   '[x]'$ Ulcers   '[x]'$ Wounds Psychological:  '[]'$ History of anxiety   '[]'$  History of major depression.  Physical Examination  BP 109/71 (BP Location: Right Arm)   Pulse 89   Resp 16  Gen:  WD/WN, NAD Head: [place]/AT, No temporalis wasting. Ear/Nose/Throat: Hearing grossly intact, nares w/o erythema or drainage Eyes: Conjunctiva clear. Sclera non-icteric Neck: Supple.  Trachea midline Pulmonary:  Good air movement, no use of accessory muscles.  Cardiac: Irregular Vascular:  Vessel Right Left  Radial Palpable Palpable           Musculoskeletal: M/S 5/5 throughout.  No deformity or atrophy.  In a wheelchair.  Trace right lower extremity edema, 1+ left lower extremity edema. Neurologic: Sensation grossly intact in extremities.  Symmetrical.  Speech is fluent.  Psychiatric: Judgment intact, Mood & affect appropriate for pt's clinical situation. Dermatologic: No rashes or ulcers noted.  No cellulitis or open wounds.      Labs No results found for this or any previous visit (from the past 2160 hour(s)).  Radiology No results found.  Assessment/Plan Chronic systolic heart failure ([place]) Has been present for many years and is a major contributing factor to his lower extremity swelling.   HTN (hypertension) blood pressure control important in reducing the progression of atherosclerotic disease. On appropriate oral medications.     Stage 3b chronic kidney disease (HCC) A contributing factor to his LE edema.   HLD (hyperlipidemia) lipid control important in reducing the progression of atherosclerotic disease. Continue statin therapy  Swelling of limb Despite not walking much or using compression, his swelling remains mild.   The facility is doing an excellent job with this.  I am going to stretch out his follow-up to 1 year.    Leotis Pain, MD  08/30/2022 11:59 AM    This note was created with Dragon medical transcription system.  Any errors from dictation are purely unintentional

## 2022-08-30 NOTE — Assessment & Plan Note (Signed)
Despite not walking much or using compression, his swelling remains mild.  The facility is doing an excellent job with this.  I am going to stretch out his follow-up to 1 year.

## 2022-09-04 DIAGNOSIS — L853 Xerosis cutis: Secondary | ICD-10-CM | POA: Diagnosis not present

## 2022-09-04 DIAGNOSIS — B351 Tinea unguium: Secondary | ICD-10-CM | POA: Diagnosis not present

## 2022-09-04 DIAGNOSIS — L814 Other melanin hyperpigmentation: Secondary | ICD-10-CM | POA: Diagnosis not present

## 2022-09-04 DIAGNOSIS — L570 Actinic keratosis: Secondary | ICD-10-CM | POA: Diagnosis not present

## 2022-09-04 DIAGNOSIS — L821 Other seborrheic keratosis: Secondary | ICD-10-CM | POA: Diagnosis not present

## 2022-09-04 DIAGNOSIS — D1801 Hemangioma of skin and subcutaneous tissue: Secondary | ICD-10-CM | POA: Diagnosis not present

## 2022-09-26 DIAGNOSIS — E7800 Pure hypercholesterolemia, unspecified: Secondary | ICD-10-CM | POA: Diagnosis not present

## 2022-09-26 DIAGNOSIS — I10 Essential (primary) hypertension: Secondary | ICD-10-CM | POA: Diagnosis not present

## 2022-09-26 DIAGNOSIS — I4819 Other persistent atrial fibrillation: Secondary | ICD-10-CM | POA: Diagnosis not present

## 2022-09-26 DIAGNOSIS — I5022 Chronic systolic (congestive) heart failure: Secondary | ICD-10-CM | POA: Diagnosis not present

## 2022-09-27 ENCOUNTER — Other Ambulatory Visit: Payer: Self-pay | Admitting: Student

## 2022-09-27 NOTE — Progress Notes (Signed)
Some confusion regarding torsemide order. Cardiology note from [facility] [place] on 09/26/22 shows no change in current management, however, there was a change in the torsemide dose. Appears to be contrary to plan. Will continue patient on current dose of '20mg'$  torsemide daily.

## 2022-10-18 ENCOUNTER — Encounter: Payer: Self-pay | Admitting: Student

## 2022-10-18 ENCOUNTER — Other Ambulatory Visit: Payer: Self-pay | Admitting: Student

## 2022-10-18 ENCOUNTER — Non-Acute Institutional Stay (SKILLED_NURSING_FACILITY): Payer: Medicare PPO | Admitting: Student

## 2022-10-18 DIAGNOSIS — W19XXXD Unspecified fall, subsequent encounter: Secondary | ICD-10-CM | POA: Diagnosis not present

## 2022-10-18 DIAGNOSIS — I5022 Chronic systolic (congestive) heart failure: Secondary | ICD-10-CM | POA: Diagnosis not present

## 2022-10-18 DIAGNOSIS — I10 Essential (primary) hypertension: Secondary | ICD-10-CM

## 2022-10-18 DIAGNOSIS — N1832 Chronic kidney disease, stage 3b: Secondary | ICD-10-CM

## 2022-10-18 DIAGNOSIS — I4819 Other persistent atrial fibrillation: Secondary | ICD-10-CM | POA: Diagnosis not present

## 2022-10-18 DIAGNOSIS — R600 Localized edema: Secondary | ICD-10-CM | POA: Diagnosis not present

## 2022-10-18 DIAGNOSIS — E785 Hyperlipidemia, unspecified: Secondary | ICD-10-CM | POA: Diagnosis not present

## 2022-10-18 DIAGNOSIS — R531 Weakness: Secondary | ICD-10-CM | POA: Diagnosis not present

## 2022-10-18 NOTE — Progress Notes (Signed)
Location:  Other [facility] Room Number: [place] [facility] of Service:  SNF [phone]) Provider:  Dr. Amada Kingfisher, MD  Patient Care Team: Dewayne Shorter, MD as PCP - General (Family Medicine) Alisa Graff, FNP as Nurse Practitioner (Family Medicine) Corey Skains, MD as Consulting Physician (Cardiology)  Extended Emergency Contact Information Primary Emergency Contact: [place] of [place] Mobile Phone: [phone] Relation: Spouse Secondary Emergency Contact: Seth Bake Mobile Phone: [phone] Relation: Daughter  Code Status:  DNR Goals of care: Advanced Directive information    06/12/2021    9:00 AM  Advanced Directives  Does Patient Have a Medical Advance Directive? Yes  Type of Advance Directive Living will  Does patient want to make changes to medical advance directive? No - Patient declined     Chief Complaint  Patient presents with   Medical Management of Chronic Issues    Medical Management of Chronic Issues.     HPI:  Pt is a 88 y.o. male seen today for medical management of chronic diseases.  Patient's wife is at bedside for entirety of evaluation.   November 2021. Holiday of the month  Days of the week forward and backward He is uncertain what holiday is this month-- initially says halloween and with prompting says [place] Day. WORLD forward find, unable to do it backwards.  Sleeps well. Appetite.   No signs of bleeding. Denies nausea/vomiting, CP, SOB, leg swelling.  He would like his toenails cut.  His wife betsy is at bedside. States he has occasional back pain.    Past Medical History:  Diagnosis Date   Arrhythmia    atrial fibrillation   Atrial fibrillation, persistent ([place])    on eliquis   Chronic systolic heart failure ([place]) 07/31/2017   Hx of radiation therapy    Hyperlipidemia    Hypertension    Prostate cancer ([place])    Prostate cancer [facility])    Past  Surgical History:  Procedure Laterality Date   CARDIOVERSION N/A 09/10/2017   Procedure: Cardioversion;  Surgeon: Corey Skains, MD;  Location: [facility] ORS;  Service: Cardiovascular;  Laterality: N/A;   CAROTID ARTERY ANGIOPLASTY     CATARACT EXTRACTION Bilateral    INSERTION PROSTATE RADIATION SEED     TONSILLECTOMY      No Known Allergies  Outpatient Encounter Medications as of 10/18/2022  Medication Sig   acetaminophen (TYLENOL) 650 MG CR tablet Take 650 mg by mouth every 8 (eight) hours as needed for pain.   apixaban (ELIQUIS) 5 MG TABS tablet Take 1 tablet (5 mg total) by mouth 2 (two) times daily.   cetirizine (ZYRTEC) 10 MG tablet Take 10 mg by mouth daily.   Cranberry 500 MG CAPS Take 500 mg by mouth daily.   diclofenac Sodium (VOLTAREN) 1 % GEL Apply 4gm to lower back topically every 8 hours as needed for discomfort   fluconazole (DIFLUCAN) 50 MG tablet Take one tablet by mouth as needed for Tinea Cruris One time per week.   magnesium hydroxide (MILK OF MAGNESIA) 400 MG/5ML suspension Take 30 mLs by mouth. Every 12 hours as needed   melatonin 5 MG TABS Take 5 mg by mouth at bedtime.   metoprolol succinate (TOPROL-XL) 50 MG 24 hr tablet Take 50 mg by mouth daily.   Multiple Vitamins-Minerals (MULTIVITAMIN WITH MINERALS) tablet Take 1 tablet by mouth daily.   nystatin (MYCOSTATIN/NYSTOP) powder Apply to Groin topically as needed for rash.   torsemide (DEMADEX) 20  MG tablet Take 1 tablet (20 mg total) by mouth daily.   [DISCONTINUED] acetaminophen (TYLENOL) 500 MG tablet Take 500 mg by mouth every 6 (six) hours as needed (for pain/headaches.).   [DISCONTINUED] empagliflozin (JARDIANCE) 10 MG TABS tablet Take 10 mg by mouth daily with breakfast.   [DISCONTINUED] metoprolol tartrate (LOPRESSOR) 50 MG tablet Take 1 tablet (50 mg total) by mouth 2 (two) times daily.   [DISCONTINUED] silver sulfADIAZINE (SILVADENE) 1 % cream Apply topically 2 (two) times daily.   [DISCONTINUED]  traZODone (DESYREL) 50 MG tablet Take by mouth.   No facility-administered encounter medications on file as of 10/18/2022.    Review of Systems  All other systems reviewed and are negative.   Immunization History  Administered Date(s) Administered   Fluad Quad(high Dose 65+) 10/01/2022   Influenza Inj Mdck Quad Pf 09/24/2018, 09/10/2019   Influenza,inj,Quad PF,6+ Mos 09/23/2017, 09/14/2020   Influenza-Unspecified 09/23/2016, 09/23/2017   Moderna Sars-Covid-2 Vaccination 12/31/2019, 05/14/2022   Pneumococcal Conjugate-13 02/24/2014   Pneumococcal Polysaccharide-23 08/17/1999   Zoster, Live 04/07/2008   Pertinent  Health Maintenance Due  Topic Date Due   INFLUENZA VACCINE  Completed      06/17/2021   11:00 AM 06/17/2021    9:00 PM 06/18/2021    3:00 PM 06/19/2021   12:00 AM 06/19/2021   10:33 AM  Fall Risk  Patient Fall Risk Level High fall risk High fall risk High fall risk High fall risk High fall risk   Functional Status Survey:    Vitals:   10/18/22 1327  BP: 110/80  Pulse: 94  Resp: 18  Temp: (!) 97 F (36.1 C)  SpO2: 94%  Weight: 192 lb 12.8 oz (87.5 kg)  Height: '5\' 9"'$  (1.753 m)   Body mass index is 28.47 kg/m. Physical Exam Vitals reviewed.  Constitutional:      Appearance: Normal appearance. He is obese.  Cardiovascular:     Rate and Rhythm: Normal rate and regular rhythm.     Pulses: Normal pulses.     Heart sounds: Normal heart sounds.  Pulmonary:     Effort: Pulmonary effort is normal.  Abdominal:     General: Abdomen is flat. Bowel sounds are normal.     Palpations: Abdomen is soft.  Musculoskeletal:        General: No swelling or tenderness.  Skin:    General: Skin is warm and dry.     Capillary Refill: Capillary refill takes less than 2 seconds.  Neurological:     General: No focal deficit present.     Mental Status: He is alert. Mental status is at baseline.  Psychiatric:        Mood and Affect: Mood normal.     Labs reviewed: Recent Labs     06/20/22 0000  NA 138  K 4.1  CL 101  CO2 30*  BUN 37*  CREATININE 1.4*  CALCIUM 8.8   No results for input(s): "AST", "ALT", "ALKPHOS", "BILITOT", "PROT", "ALBUMIN" in the last 8760 hours. No results for input(s): "WBC", "NEUTROABS", "HGB", "HCT", "MCV", "PLT" in the last 8760 hours. Lab Results  Component Value Date   TSH 1.361 06/12/2021   Lab Results  Component Value Date   HGBA1C 5.7 (H) 12/17/2020   No results found for: "CHOL", "HDL", "LDLCALC", "LDLDIRECT", "TRIG", "CHOLHDL"  Significant Diagnostic Results in last 30 days:  No results found.  Assessment/Plan 1. Chronic systolic heart failure [facility]) Patient followed by cardiology appears euvolemic on exam currently taking torsemide 20 mg  daily we will hold if patient's weight is less than 190.  2. Persistent atrial fibrillation (HCC) Heart rate well controlled on current regimen with metoprolol 50 mg daily we will continue to monitor.  We will continue apixaban 5 mg twice daily until labs result and determine patient's kidney disease level.  If creatinine is greater than 1.1 we will decrease apixaban to 2.5 mg twice daily per AAFP recommendations for anticoagulation.  3. Primary hypertension Patient's blood pressure well controlled on current dose of metoprolol and torsemide.  4. Stage 3b chronic kidney disease ([place]) CKD 3B without evidence of volume overload at this time we will collect BMP and CBC to evaluate for current functional status.  Also collect vitamin D and PTH.  5. Hyperlipidemia, unspecified hyperlipidemia type Per recent goals of care conversations will not continue cholesterol medication at this time.  6. Bilateral leg edema No evidence of edema in the bilateral lower extremities.  Patient currently on torsemide for weight management with CHF.  Continue to monitor  7. Fall, subsequent encounter, 04/02/2021 8. Generalized weakness Patient remains in health care center for skilled nursing care  given his history of fall and continued fall risk.  He typically mobilizes through the building with a wheelchair.       Family/ staff Communication: wife and nursing  Labs/tests ordered:  CBC, Bmp  Tomasa Rand, MD, [place] [phone]

## 2022-10-21 DIAGNOSIS — I5023 Acute on chronic systolic (congestive) heart failure: Secondary | ICD-10-CM | POA: Diagnosis not present

## 2022-10-21 DIAGNOSIS — I4819 Other persistent atrial fibrillation: Secondary | ICD-10-CM | POA: Diagnosis not present

## 2022-10-21 DIAGNOSIS — E119 Type 2 diabetes mellitus without complications: Secondary | ICD-10-CM | POA: Diagnosis not present

## 2022-10-21 DIAGNOSIS — E7849 Other hyperlipidemia: Secondary | ICD-10-CM | POA: Diagnosis not present

## 2022-10-21 DIAGNOSIS — I110 Hypertensive heart disease with heart failure: Secondary | ICD-10-CM | POA: Diagnosis not present

## 2022-10-21 DIAGNOSIS — I2510 Atherosclerotic heart disease of native coronary artery without angina pectoris: Secondary | ICD-10-CM | POA: Diagnosis not present

## 2022-10-21 LAB — CBC AND DIFFERENTIAL
HCT: 47 (ref 41–53)
Hemoglobin: 16.2 (ref 13.5–17.5)
Neutrophils Absolute: 4840
Platelets: 199 10*3/uL (ref 150–400)
WBC: 7.3

## 2022-10-21 LAB — BASIC METABOLIC PANEL
BUN: 34 — AB (ref 4–21)
CO2: 26 — AB (ref 13–22)
Chloride: 104 (ref 99–108)
Creatinine: 1.3 (ref 0.6–1.3)
Glucose: 74
Potassium: 4.4 mEq/L (ref 3.5–5.1)
Sodium: 140 (ref 137–147)

## 2022-10-21 LAB — COMPREHENSIVE METABOLIC PANEL
Calcium: 9.3 (ref 8.7–10.7)
eGFR: 52

## 2022-10-21 LAB — HEMOGLOBIN A1C: Hemoglobin A1C: 5.7

## 2022-10-21 LAB — CBC: RBC: 5.4 — AB (ref 3.87–5.11)

## 2022-10-30 DIAGNOSIS — L814 Other melanin hyperpigmentation: Secondary | ICD-10-CM | POA: Diagnosis not present

## 2022-10-30 DIAGNOSIS — L570 Actinic keratosis: Secondary | ICD-10-CM | POA: Diagnosis not present

## 2022-10-30 DIAGNOSIS — L853 Xerosis cutis: Secondary | ICD-10-CM | POA: Diagnosis not present

## 2022-10-30 DIAGNOSIS — L821 Other seborrheic keratosis: Secondary | ICD-10-CM | POA: Diagnosis not present

## 2022-12-22 ENCOUNTER — Emergency Department
Admission: EM | Admit: 2022-12-22 | Discharge: 2022-12-23 | Disposition: A | Discharge status: Skilled Nursing Facility | Payer: Medicare PPO | Attending: Emergency Medicine | Admitting: Emergency Medicine

## 2022-12-22 ENCOUNTER — Emergency Department: Payer: Medicare PPO

## 2022-12-22 ENCOUNTER — Other Ambulatory Visit: Payer: Self-pay

## 2022-12-22 DIAGNOSIS — F0390 Unspecified dementia without behavioral disturbance: Secondary | ICD-10-CM | POA: Diagnosis not present

## 2022-12-22 DIAGNOSIS — I509 Heart failure, unspecified: Secondary | ICD-10-CM | POA: Diagnosis not present

## 2022-12-22 DIAGNOSIS — I4891 Unspecified atrial fibrillation: Secondary | ICD-10-CM | POA: Diagnosis not present

## 2022-12-22 DIAGNOSIS — J9811 Atelectasis: Secondary | ICD-10-CM | POA: Diagnosis not present

## 2022-12-22 DIAGNOSIS — J90 Pleural effusion, not elsewhere classified: Secondary | ICD-10-CM | POA: Diagnosis not present

## 2022-12-22 DIAGNOSIS — I110 Hypertensive heart disease with heart failure: Secondary | ICD-10-CM | POA: Diagnosis not present

## 2022-12-22 DIAGNOSIS — R000 Tachycardia, unspecified: Secondary | ICD-10-CM | POA: Diagnosis not present

## 2022-12-22 DIAGNOSIS — R079 Chest pain, unspecified: Secondary | ICD-10-CM | POA: Diagnosis not present

## 2022-12-22 DIAGNOSIS — J811 Chronic pulmonary edema: Secondary | ICD-10-CM | POA: Diagnosis not present

## 2022-12-22 DIAGNOSIS — I482 Chronic atrial fibrillation, unspecified: Secondary | ICD-10-CM

## 2022-12-22 DIAGNOSIS — R0789 Other chest pain: Secondary | ICD-10-CM | POA: Diagnosis not present

## 2022-12-22 LAB — CBC WITH DIFFERENTIAL/PLATELET
Abs Immature Granulocytes: 0.02 10*3/uL (ref 0.00–0.07)
Basophils Absolute: 0 10*3/uL (ref 0.0–0.1)
Basophils Relative: 1 %
Eosinophils Absolute: 0.2 10*3/uL (ref 0.0–0.5)
Eosinophils Relative: 2 %
HCT: 49.6 % (ref 39.0–52.0)
Hemoglobin: 16.1 g/dL (ref 13.0–17.0)
Immature Granulocytes: 0 %
Lymphocytes Relative: 13 %
Lymphs Abs: 1 10*3/uL (ref 0.7–4.0)
MCH: 29.8 pg (ref 26.0–34.0)
MCHC: 32.5 g/dL (ref 30.0–36.0)
MCV: 91.9 fL (ref 80.0–100.0)
Monocytes Absolute: 0.9 10*3/uL (ref 0.1–1.0)
Monocytes Relative: 11 %
Neutro Abs: 5.7 10*3/uL (ref 1.7–7.7)
Neutrophils Relative %: 73 %
Platelets: 233 10*3/uL (ref 150–400)
RBC: 5.4 MIL/uL (ref 4.22–5.81)
RDW: 15.8 % — ABNORMAL HIGH (ref 11.5–15.5)
WBC: 7.8 10*3/uL (ref 4.0–10.5)
nRBC: 0 % (ref 0.0–0.2)

## 2022-12-22 LAB — TROPONIN I (HIGH SENSITIVITY): Troponin I (High Sensitivity): 6 ng/L (ref <18)

## 2022-12-22 MED ORDER — ALUM & MAG HYDROXIDE-SIMETH 200-200-20 MG/5ML PO SUSP
30.0000 mL | Freq: Once | ORAL | Status: AC
  Administered 2022-12-23: 30 mL via ORAL
  Filled 2022-12-22: qty 30

## 2022-12-22 MED ORDER — SODIUM CHLORIDE 0.9 % IV BOLUS
500.0000 mL | Freq: Once | INTRAVENOUS | Status: AC
  Administered 2022-12-22: 500 mL via INTRAVENOUS

## 2022-12-22 NOTE — ED Triage Notes (Signed)
Pt from [facility] c/o chest pain approx 9hrs ago. Pt has hx of afib and per EMS his afib has been running between 70-130bpm. PT appears in no obvious distress at this time. Pt has hx of dementia but is currently oriented x4.

## 2022-12-22 NOTE — ED Notes (Signed)
ED Provider at bedside. 

## 2022-12-22 NOTE — ED Notes (Addendum)
Pt appears in no obvious distress at this time. Denies needs

## 2022-12-22 NOTE — Discharge Instructions (Addendum)
No signs of heart attack.  Chest xray overall re-assuring.  Call Cardiology to make a follow up appointment and return to ER for any changes in symptoms or other concerns.

## 2022-12-22 NOTE — ED Provider Notes (Signed)
[facility] Provider Note    Event Date/Time   First MD Initiated Contact with Patient 12/22/22 2115     (approximate)   History   Chief Complaint: Chest Pain   HPI  Chris Davis is a 89 y.o. male with a history of CHF, hypertension, atrial fibrillation, dementia who was brought to the ED due to chest pain since 12:00 noon today.  Patient denies chest pain on arrival.  He is not able to specify the pattern or nature of the chest pain.  No shortness of breath or fever.     Physical Exam   Triage Vital Signs: ED Triage Vitals [12/22/22 2113]  Enc Vitals Group     BP      Pulse      Resp      Temp      Temp src      SpO2      Weight      Height      Head Circumference      Peak Flow      Pain Score 3     Pain Loc      Pain Edu?      Excl. in [place]?     Most recent vital signs: Vitals:   12/22/22 2230 12/22/22 2300  BP: 122/80 (!) 119/91  Pulse: 92 (!) 105  Resp: (!) 24 (!) 23  Temp:    SpO2: 95% 95%    General: Awake, no distress.  CV:  Good peripheral perfusion.  Irregular rhythm, rate controlled. Resp:  Normal effort.  Clear to auscultation bilaterally Abd:  No distention.  Soft nontender Other:  Trace lower extremity edema.  Symmetric calf circumference, no calf tenderness.  Moist oral mucosa.   ED Results / Procedures / Treatments   Labs (all labs ordered are listed, but only abnormal results are displayed) Labs Reviewed  CBC WITH DIFFERENTIAL/PLATELET - Abnormal; Notable for the following components:      Result Value   RDW 15.8 (*)    All other components within normal limits  BASIC METABOLIC PANEL  TROPONIN I (HIGH SENSITIVITY)  TROPONIN I (HIGH SENSITIVITY)     EKG Interpreted by me Atrial fibrillation rate of 94.  Left axis, normal intervals.  Poor R wave progression.  Normal ST segments and T waves, no ischemic changes.  1 PVC on the strip.   RADIOLOGY Chest x-ray interpreted by me, no pleural effusion,  infiltrate, or frank pulmonary edema.  Radiology report reviewed, noting vascular congestion   PROCEDURES:  Procedures   MEDICATIONS ORDERED IN ED: Medications  alum & mag hydroxide-simeth (MAALOX/MYLANTA) 200-200-20 MG/5ML suspension 30 mL (has no administration in time range)  sodium chloride 0.9 % bolus 500 mL (0 mLs Intravenous Stopped 12/22/22 2231)     IMPRESSION / MDM / ASSESSMENT AND PLAN / ED COURSE  I reviewed the triage vital signs and the nursing notes.                              Differential diagnosis includes, but is not limited to, GERD, non-STEMI, anemia, dehydration, pneumothorax, pleural effusion  Patient's presentation is most consistent with acute presentation with potential threat to life or bodily function.  Patient presents with nonspecific chest pain.  Vital signs and exam unremarkable, EKG nonischemic.  Initial troponin is normal.  Pending metabolic panel and delta troponin at which point I think patient is stable for discharge home.  FINAL CLINICAL IMPRESSION(S) / ED DIAGNOSES   Final diagnoses:  Nonspecific chest pain  Chronic dementia (HCC)  Chronic congestive heart failure, unspecified heart failure type ([place])  Chronic atrial fibrillation ([place])     Rx / DC Orders   ED Discharge Orders     None        Note:  This document was prepared using Dragon voice recognition software and may include unintentional dictation errors.   Carrie Mew, MD 12/22/22 [phone]

## 2022-12-22 NOTE — ED Notes (Signed)
Lab stated that the repeat BMP needs to be recollected for the 3rd time, sts they will send a phlebotomist.

## 2022-12-23 ENCOUNTER — Non-Acute Institutional Stay (SKILLED_NURSING_FACILITY): Payer: Medicare PPO | Admitting: Student

## 2022-12-23 ENCOUNTER — Encounter: Payer: Self-pay | Admitting: Student

## 2022-12-23 DIAGNOSIS — I4819 Other persistent atrial fibrillation: Secondary | ICD-10-CM

## 2022-12-23 DIAGNOSIS — R531 Weakness: Secondary | ICD-10-CM | POA: Diagnosis not present

## 2022-12-23 DIAGNOSIS — N1832 Chronic kidney disease, stage 3b: Secondary | ICD-10-CM

## 2022-12-23 DIAGNOSIS — I5022 Chronic systolic (congestive) heart failure: Secondary | ICD-10-CM | POA: Diagnosis not present

## 2022-12-23 DIAGNOSIS — Z7401 Bed confinement status: Secondary | ICD-10-CM | POA: Diagnosis not present

## 2022-12-23 DIAGNOSIS — I10 Essential (primary) hypertension: Secondary | ICD-10-CM

## 2022-12-23 DIAGNOSIS — R29898 Other symptoms and signs involving the musculoskeletal system: Secondary | ICD-10-CM | POA: Diagnosis not present

## 2022-12-23 LAB — BASIC METABOLIC PANEL
Anion gap: 9 (ref 5–15)
BUN: 32 mg/dL — ABNORMAL HIGH (ref 8–23)
CO2: 24 mmol/L (ref 22–32)
Calcium: 8.6 mg/dL — ABNORMAL LOW (ref 8.9–10.3)
Chloride: 103 mmol/L (ref 98–111)
Creatinine, Ser: 1.24 mg/dL (ref 0.61–1.24)
GFR, Estimated: 56 mL/min — ABNORMAL LOW (ref >60)
Glucose, Bld: 99 mg/dL (ref 70–99)
Potassium: 4.2 mmol/L (ref 3.5–5.1)
Sodium: 136 mmol/L (ref 135–145)

## 2022-12-23 LAB — TROPONIN I (HIGH SENSITIVITY): Troponin I (High Sensitivity): 9 ng/L (ref <18)

## 2022-12-23 NOTE — ED Notes (Signed)
Called [facility] for transport back to [facility]

## 2022-12-23 NOTE — ED Notes (Signed)
Called pt's wife per his request, pt is now on the phone with his wife.

## 2022-12-23 NOTE — Progress Notes (Signed)
Location:  Other [place].  Nursing Home Room Number: [place] of Service:  SNF [phone]) Provider:  Dr. Amada Kingfisher, MD  Patient Care Team: Dewayne Shorter, MD as PCP - General (Family Medicine) Alisa Graff, FNP as Nurse Practitioner (Family Medicine) Corey Skains, MD as Consulting Physician (Cardiology)  Extended Emergency Contact Information Primary Emergency Contact: [place] of [place] Mobile Phone: [phone] Relation: Spouse Secondary Emergency Contact: Seth Bake Mobile Phone: [phone] Relation: Daughter  Code Status:  DNR Goals of care: Advanced Directive information    12/23/2022   10:55 AM  Advanced Directives  Does Patient Have a Medical Advance Directive? Yes  Type of Advance Directive Out of facility DNR (pink MOST or yellow form)  Does patient want to make changes to medical advance directive? No - Patient declined     Chief Complaint  Patient presents with   Acute Visit    ER Follow up    HPI:  Pt is a 89 y.o. male seen today for an acute and routine visit to follow up visit to the ED overnight for chest pain. Patient states he is feeling much better at this time. He states he wasn't doing anything in particular. He was relieved he did not have a heart attack because he was sure the pain was evidence of one.   Otherwise he has been eating normally, normal urination, bowel movements.   Past Medical History:  Diagnosis Date   Arrhythmia    atrial fibrillation   Atrial fibrillation, persistent ([place])    on eliquis   Chronic systolic heart failure ([place]) 07/31/2017   Hx of radiation therapy    Hyperlipidemia    Hypertension    Prostate cancer ([place])    Prostate cancer [facility])    Past Surgical History:  Procedure Laterality Date   CARDIOVERSION N/A 09/10/2017   Procedure: Cardioversion;  Surgeon: Corey Skains, MD;  Location: [facility] ORS;  Service: Cardiovascular;   Laterality: N/A;   CAROTID ARTERY ANGIOPLASTY     CATARACT EXTRACTION Bilateral    INSERTION PROSTATE RADIATION SEED     TONSILLECTOMY      No Known Allergies  Outpatient Encounter Medications as of 12/23/2022  Medication Sig   acetaminophen (TYLENOL) 650 MG CR tablet Take 650 mg by mouth every 8 (eight) hours as needed for pain.   apixaban (ELIQUIS) 5 MG TABS tablet Take 1 tablet (5 mg total) by mouth 2 (two) times daily.   Cranberry 500 MG CAPS Take 500 mg by mouth daily.   diclofenac Sodium (VOLTAREN) 1 % GEL Apply 4gm to lower back topically every 8 hours as needed for discomfort   fluconazole (DIFLUCAN) 50 MG tablet Take one tablet by mouth as needed for Tinea Cruris One time per week.   magnesium hydroxide (MILK OF MAGNESIA) 400 MG/5ML suspension Take 30 mLs by mouth. Every 12 hours as needed   melatonin 5 MG TABS Take 5 mg by mouth at bedtime.   metoprolol succinate (TOPROL-XL) 50 MG 24 hr tablet Take 50 mg by mouth daily.   Multiple Vitamins-Minerals (MULTIVITAMIN WITH MINERALS) tablet Take 1 tablet by mouth daily.   nystatin (MYCOSTATIN/NYSTOP) powder Apply to Groin topically as needed for rash.   torsemide (DEMADEX) 20 MG tablet Take 1 tablet (20 mg total) by mouth daily.   [DISCONTINUED] cetirizine (ZYRTEC) 10 MG tablet Take 10 mg by mouth daily.   No facility-administered encounter medications on file as of 12/23/2022.  Review of Systems  All other systems reviewed and are negative.   Immunization History  Administered Date(s) Administered   Fluad Quad(high Dose 65+) 10/01/2022   Influenza Inj Mdck Quad Pf 09/24/2018, 09/10/2019   Influenza,inj,Quad PF,6+ Mos 09/23/2017, 09/14/2020   Influenza-Unspecified 09/23/2016, 09/23/2017   Moderna Sars-Covid-2 Vaccination 12/31/2019, 05/14/2022, 10/25/2022   Pneumococcal Conjugate-13 02/24/2014   Pneumococcal Polysaccharide-23 08/17/1999   Zoster, Live 04/07/2008   Pertinent  Health Maintenance Due  Topic Date Due    INFLUENZA VACCINE  Completed      06/17/2021    9:00 PM 06/18/2021    3:00 PM 06/19/2021   12:00 AM 06/19/2021   10:33 AM 12/22/2022    9:13 PM  Fall Risk  Patient Fall Risk Level High fall risk High fall risk High fall risk High fall risk Low fall risk   Functional Status Survey:    Vitals:   12/23/22 1044  BP: 119/76  Pulse: 88  Resp: 18  Temp: 98.7 F (37.1 C)  SpO2: 93%  Weight: 200 lb 3.2 oz (90.8 kg)  Height: '5\' 9"'$  (1.753 m)   Body mass index is 29.56 kg/m. Physical Exam Vitals and nursing note reviewed.  Constitutional:      Appearance: Normal appearance.  HENT:     Mouth/Throat:     Mouth: Mucous membranes are dry.  Cardiovascular:     Rate and Rhythm: Normal rate.     Pulses: Normal pulses.  Pulmonary:     Effort: Pulmonary effort is normal.     Comments: Diminished bilateral lower lobe breath sounds.  Skin:    General: Skin is warm and dry.  Neurological:     Mental Status: He is alert.     Labs reviewed: Recent Labs    06/20/22 0000 10/21/22 0000 12/23/22 0022  NA 138 140 136  K 4.1 4.4 4.2  CL 101 104 103  CO2 30* 26* 24  GLUCOSE  --   --  99  BUN 37* 34* 32*  CREATININE 1.4* 1.3 1.24  CALCIUM 8.8 9.3 8.6*   No results for input(s): "AST", "ALT", "ALKPHOS", "BILITOT", "PROT", "ALBUMIN" in the last 8760 hours. Recent Labs    10/21/22 0000 12/22/22 2129  WBC 7.3 7.8  NEUTROABS 4,840.00 5.7  HGB 16.2 16.1  HCT 47 49.6  MCV  --  91.9  PLT 199 233   Lab Results  Component Value Date   TSH 1.361 06/12/2021   Lab Results  Component Value Date   HGBA1C 5.7 10/21/2022   No results found for: "CHOL", "HDL", "LDLCALC", "LDLDIRECT", "TRIG", "CHOLHDL"  Significant Diagnostic Results in last 30 days:  DG Chest Portable 1 View  Result Date: 12/22/2022 CLINICAL DATA:  Chest pain EXAM: PORTABLE CHEST 1 VIEW COMPARISON:  Chest x-ray 06/11/2021 FINDINGS: The heart is enlarged. There central pulmonary vascular congestion. There are small bilateral  pleural effusions with bibasilar atelectasis. No pneumothorax or acute fracture. IMPRESSION: Cardiomegaly with pulmonary vascular congestion and small bilateral pleural effusions. Electronically Signed   By: Ronney Asters M.D.   On: 12/22/2022 21:42    Assessment/Plan Persistent atrial fibrillation (HCC)  Chronic systolic heart failure (HCC)  Primary hypertension  Stage 3b chronic kidney disease (HCC)  Generalized weakness Patient's HR well-controlled. Troponins negative and ECG only showed atrial fibrillation. CXR showed bilateral lower lobe pulmonary effusions. Some concern his CXR is reflective of worsening cardiac function. BP well-controlled. CKD 3 with stable function. Plan to collect repeat BMP, BNP and CBC. If BNP >400 will  plan for repeat echocardiogram. Previous echo in 2022 showed preserved ejection fraction.    Family/ staff Communication: nursing  Labs/tests ordered:  CBC, BMP, BNP

## 2022-12-23 NOTE — ED Provider Notes (Signed)
12:38 AM Assumed care for off going team.   Blood pressure 110/83, pulse 100, temperature 97.6 F (36.4 C), resp. rate (!) 23, SpO2 95 %.  See their HPI for full report but in brief pending trop, dc    KG my interpretation was atrial fibrillation rate of 101 without any ST elevation or T wave inversions.   BMP shows slight elevation of BUN but similar to prior and creatinine is downtrending from 6 months ago. Repeat troponin was negative  Repeat evaluation patient reports that he is feeling well.  He denies any chest pain at this time.  His heart rates are ranging in the 90s.  Oxygen levels between 94 for 96%.  Reviewed a home health note from 11/30 where his oxygen level was 94% as well.  He reports doing well.  Updated patient he denied any abdominal discomfort and he feels comfortable with discharge home   Vanessa [place], MD 12/23/22 [phone]

## 2022-12-23 NOTE — ED Notes (Signed)
[facility] picked up pt, paperwork went with them including pt's yellow DNR

## 2022-12-26 DIAGNOSIS — I509 Heart failure, unspecified: Secondary | ICD-10-CM | POA: Diagnosis not present

## 2022-12-26 LAB — COMPREHENSIVE METABOLIC PANEL: Calcium: 8.9 (ref 8.7–10.7)

## 2022-12-26 LAB — BASIC METABOLIC PANEL
BUN: 35 — AB (ref 4–21)
CO2: 27 — AB (ref 13–22)
Chloride: 105 (ref 99–108)
Creatinine: 1.2 (ref 0.6–1.3)
Glucose: 77
Potassium: 4.4 mEq/L (ref 3.5–5.1)
Sodium: 139 (ref 137–147)

## 2023-01-02 ENCOUNTER — Encounter: Payer: Self-pay | Admitting: Nurse Practitioner

## 2023-01-02 DIAGNOSIS — I4819 Other persistent atrial fibrillation: Secondary | ICD-10-CM

## 2023-01-02 DIAGNOSIS — E785 Hyperlipidemia, unspecified: Secondary | ICD-10-CM

## 2023-01-02 DIAGNOSIS — N1832 Chronic kidney disease, stage 3b: Secondary | ICD-10-CM

## 2023-01-02 DIAGNOSIS — I5022 Chronic systolic (congestive) heart failure: Secondary | ICD-10-CM

## 2023-01-02 DIAGNOSIS — I10 Essential (primary) hypertension: Secondary | ICD-10-CM

## 2023-01-03 NOTE — Progress Notes (Signed)
This encounter was created in error - please disregard.

## 2023-01-06 ENCOUNTER — Non-Acute Institutional Stay (SKILLED_NURSING_FACILITY): Payer: Medicare PPO | Admitting: Student

## 2023-01-06 ENCOUNTER — Encounter: Payer: Self-pay | Admitting: Student

## 2023-01-06 DIAGNOSIS — R058 Other specified cough: Secondary | ICD-10-CM

## 2023-01-06 DIAGNOSIS — S00431A Contusion of right ear, initial encounter: Secondary | ICD-10-CM

## 2023-01-06 DIAGNOSIS — L814 Other melanin hyperpigmentation: Secondary | ICD-10-CM | POA: Diagnosis not present

## 2023-01-06 DIAGNOSIS — I517 Cardiomegaly: Secondary | ICD-10-CM | POA: Diagnosis not present

## 2023-01-06 DIAGNOSIS — R0989 Other specified symptoms and signs involving the circulatory and respiratory systems: Secondary | ICD-10-CM | POA: Diagnosis not present

## 2023-01-06 DIAGNOSIS — R233 Spontaneous ecchymoses: Secondary | ICD-10-CM | POA: Diagnosis not present

## 2023-01-06 DIAGNOSIS — L821 Other seborrheic keratosis: Secondary | ICD-10-CM | POA: Diagnosis not present

## 2023-01-06 DIAGNOSIS — L570 Actinic keratosis: Secondary | ICD-10-CM | POA: Diagnosis not present

## 2023-01-06 DIAGNOSIS — L853 Xerosis cutis: Secondary | ICD-10-CM | POA: Diagnosis not present

## 2023-01-06 NOTE — Progress Notes (Signed)
Location:  Other [place].  Nursing Home Room Number: [place] of Service:  SNF [phone]) Provider:  Dewayne Shorter, MD  Patient Care Team: Dewayne Shorter, MD as PCP - General (Family Medicine) Alisa Graff, FNP as Nurse Practitioner (Family Medicine) Corey Skains, MD as Consulting Physician (Cardiology)  Extended Emergency Contact Information Primary Emergency Contact: [place] of [place] Mobile Phone: [phone] Relation: Spouse Secondary Emergency Contact: Seth Bake Mobile Phone: [phone] Relation: Daughter  Code Status:  DNR Goals of care: Advanced Directive information    01/06/2023    1:02 PM  Advanced Directives  Does Patient Have a Medical Advance Directive? Yes  Type of Paramedic of [place];Out of facility DNR (pink MOST or yellow form)  Does patient want to make changes to medical advance directive? No - Patient declined  Copy of [place] in Chart? Yes - validated most recent copy scanned in chart (See row information)     Chief Complaint  Patient presents with   Acute Visit    Cough    HPI:  Pt is a 89 y.o. male seen today for an acute visit for cough. Patient with "wet" cough without producing mucus. More sleepy. Wife at bedside concernerned about a bruise on his right ear. No clear cause of injury or impact. Patient unable to clearly define a mechanism of injury given stage of dementia. No recent falls   Past Medical History:  Diagnosis Date   Arrhythmia    atrial fibrillation   Atrial fibrillation, persistent ([place])    on eliquis   Chronic systolic heart failure ([place]) 07/31/2017   Hx of radiation therapy    Hyperlipidemia    Hypertension    Prostate cancer ([place])    Prostate cancer [facility])    Past Surgical History:  Procedure Laterality Date   CARDIOVERSION N/A 09/10/2017   Procedure: Cardioversion;  Surgeon: Corey Skains, MD;  Location:  [facility] ORS;  Service: Cardiovascular;  Laterality: N/A;   CAROTID ARTERY ANGIOPLASTY     CATARACT EXTRACTION Bilateral    INSERTION PROSTATE RADIATION SEED     TONSILLECTOMY      No Known Allergies  Outpatient Encounter Medications as of 01/06/2023  Medication Sig   acetaminophen (TYLENOL) 500 MG tablet Take 1,000 mg by mouth every 4 (four) hours as needed for mild pain or moderate pain.   apixaban (ELIQUIS) 5 MG TABS tablet Take 1 tablet (5 mg total) by mouth 2 (two) times daily.   Cranberry 500 MG CAPS Take 500 mg by mouth daily.   dextromethorphan-guaiFENesin (ROBITUSSIN-DM) 10-100 MG/5ML liquid Take 5 mLs by mouth every 4 (four) hours as needed for cough.   diclofenac Sodium (VOLTAREN) 1 % GEL Apply 4gm to lower back topically every 8 hours as needed for discomfort   fluconazole (DIFLUCAN) 150 MG tablet Take 150 mg by mouth as needed. For Tinea Crurisone time per week   magnesium hydroxide (MILK OF MAGNESIA) 400 MG/5ML suspension Take 30 mLs by mouth. Every 12 hours as needed   melatonin 5 MG TABS Take 5 mg by mouth at bedtime.   metoprolol succinate (TOPROL-XL) 50 MG 24 hr tablet Take 50 mg by mouth daily.   Multiple Vitamins-Minerals (MULTIVITAMIN WITH MINERALS) tablet Take 1 tablet by mouth daily.   nystatin (MYCOSTATIN/NYSTOP) powder Apply to Groin topically as needed for rash.   torsemide (DEMADEX) 20 MG tablet Take 1 tablet (20 mg total) by mouth daily.   No facility-administered  encounter medications on file as of 01/06/2023.    Review of Systems  All other systems reviewed and are negative.   Immunization History  Administered Date(s) Administered   Covid-19, Mrna,Vaccine(Spikevax)5yr and older 11/14/2022   Fluad Quad(high Dose 65+) 10/01/2022   Influenza Inj Mdck Quad Pf 09/24/2018, 09/10/2019   Influenza,inj,Quad PF,6+ Mos 09/23/2017, 09/14/2020   Influenza-Unspecified 09/23/2016, 09/23/2017   Moderna Covid-19 Vaccine Bivalent Booster 185yr& up 09/07/2021,  05/14/2022   Moderna SARS-COV2 Booster Vaccination 10/31/2020, 05/03/2021   Moderna Sars-Covid-2 Vaccination 12/31/2019, 01/28/2020   Pneumococcal Conjugate-13 02/24/2014   Pneumococcal Polysaccharide-23 08/17/1999   Zoster, Live 04/07/2008   Pertinent  Health Maintenance Due  Topic Date Due   INFLUENZA VACCINE  Completed      06/17/2021    9:00 PM 06/18/2021    3:00 PM 06/19/2021   12:00 AM 06/19/2021   10:33 AM 12/22/2022    9:13 PM  Fall Risk  (RETIRED) Patient Fall Risk Level High fall risk High fall risk High fall risk High fall risk Low fall risk   Functional Status Survey:    Vitals:   01/06/23 1256  BP: 119/76  Pulse: 88  Resp: 18  Temp: 98.1 F (36.7 C)  SpO2: 97%  Weight: 194 lb 6.4 oz (88.2 kg)  Height: '5\' 9"'$  (1.753 m)   Body mass index is 28.71 kg/m. Physical Exam HENT:     Ears:     Comments: R ear with bruising, however, no evident palpable collection of blood for drainage  Cardiovascular:     Rate and Rhythm: Normal rate.  Pulmonary:     Effort: Pulmonary effort is normal.     Comments: Coughing throught encounter and examination Neurological:     General: No focal deficit present.     Mental Status: He is alert. Mental status is at baseline.     Labs reviewed: Recent Labs    10/21/22 0000 12/23/22 0022 12/26/22 0000  NA 140 136 139  K 4.4 4.2 4.4  CL 104 103 105  CO2 26* 24 27*  GLUCOSE  --  99  --   BUN 34* 32* 35*  CREATININE 1.3 1.24 1.2  CALCIUM 9.3 8.6* 8.9   No results for input(s): "AST", "ALT", "ALKPHOS", "BILITOT", "PROT", "ALBUMIN" in the last 8760 hours. Recent Labs    10/21/22 0000 12/22/22 2129  WBC 7.3 7.8  NEUTROABS 4,840.00 5.7  HGB 16.2 16.1  HCT 47 49.6  MCV  --  91.9  PLT 199 233   Lab Results  Component Value Date   TSH 1.361 06/12/2021   Lab Results  Component Value Date   HGBA1C 5.7 10/21/2022   No results found for: "CHOL", "HDL", "LDLCALC", "LDLDIRECT", "TRIG", "CHOLHDL"  Significant Diagnostic  Results in last 30 days:  DG Chest Portable 1 View  Result Date: 12/22/2022 CLINICAL DATA:  Chest pain EXAM: PORTABLE CHEST 1 VIEW COMPARISON:  Chest x-ray 06/11/2021 FINDINGS: The heart is enlarged. There central pulmonary vascular congestion. There are small bilateral pleural effusions with bibasilar atelectasis. No pneumothorax or acute fracture. IMPRESSION: Cardiomegaly with pulmonary vascular congestion and small bilateral pleural effusions. Electronically Signed   By: AmRonney Asters.D.   On: 12/22/2022 21:42    Assessment/Plan Productive cough  Hematoma of right auricular region Patient with productive cough. NO fevers, chills, at baseline. Will plan to collect CBC, Bmp, CXR, RVP to evaluate for respiratory infection. CTM before initiating ABX at this time. F/u pending imaging and labs. Right ear without pain,  continue to monitor. No clear causes of trauma. Family is often present -- some concern it comes from constantly laying on hard neck pillow and leaning to the right side of his body.    Family/ staff Communication: Spouse and Nurse  Labs/tests ordered:  RVP, CBC, BMP, CXR

## 2023-01-07 ENCOUNTER — Encounter: Payer: Self-pay | Admitting: Adult Health

## 2023-01-07 ENCOUNTER — Non-Acute Institutional Stay (SKILLED_NURSING_FACILITY): Payer: Medicare PPO | Admitting: Adult Health

## 2023-01-07 DIAGNOSIS — I5022 Chronic systolic (congestive) heart failure: Secondary | ICD-10-CM

## 2023-01-07 DIAGNOSIS — J9600 Acute respiratory failure, unspecified whether with hypoxia or hypercapnia: Secondary | ICD-10-CM

## 2023-01-07 DIAGNOSIS — R058 Other specified cough: Secondary | ICD-10-CM | POA: Diagnosis not present

## 2023-01-07 NOTE — Progress Notes (Signed)
Location:   [place] Room Number: 503A Place of Service:  SNF ([phone]) Provider:  Durenda Age, NP  Dewayne Shorter, MD  Patient Care Team: Dewayne Shorter, MD as PCP - General (Family Medicine) Alisa Graff, FNP as Nurse Practitioner (Family Medicine) Corey Skains, MD as Consulting Physician (Cardiology)  Extended Emergency Contact Information Primary Emergency Contact: [place] of [place] Mobile Phone: [phone] Relation: Spouse Secondary Emergency Contact: Seth Bake Mobile Phone: [phone] Relation: Daughter  Code Status:  DNR Goals of care: Advanced Directive information    01/07/2023   12:40 PM  Advanced Directives  Does Patient Have a Medical Advance Directive? Yes  Type of Advance Directive Out of facility DNR (pink MOST or yellow form)  Does patient want to make changes to medical advance directive? No - Patient declined  Pre-existing out of facility DNR order (yellow form or pink MOST form) Yellow form placed in chart (order not valid for inpatient use)     Chief Complaint  Patient presents with   Acute Visit    Shortness of breath,abnormal chest x ray .    HPI:  Pt is a 89 y.o. male seen today for an acute visit for shortness of breath and abnormal chest x-ray. He has a PMH of CHF, hypertension, atrial fibrillation and dementia. Patient reported that he "doesn't feel good".He was reported to be having SOB and doesn't want to do anything. Chest x-ray showed findings most likely represent small right-sided layering effusion with associated atelectasis and superimposed congestive heart failure. He denies having chills or fever. He was seen in his room today with wife at bedside.  No noted SOB on room air. O2 sat ranging from 96-97%.    Past Medical History:  Diagnosis Date   Arrhythmia    atrial fibrillation   Atrial fibrillation, persistent ([place])    on eliquis   Chronic  systolic heart failure ([place]) 07/31/2017   Hx of radiation therapy    Hyperlipidemia    Hypertension    Prostate cancer ([place])    Prostate cancer [facility])    Past Surgical History:  Procedure Laterality Date   CARDIOVERSION N/A 09/10/2017   Procedure: Cardioversion;  Surgeon: Corey Skains, MD;  Location: [facility] ORS;  Service: Cardiovascular;  Laterality: N/A;   CAROTID ARTERY ANGIOPLASTY     CATARACT EXTRACTION Bilateral    INSERTION PROSTATE RADIATION SEED     TONSILLECTOMY      No Known Allergies  Allergies as of 01/07/2023   No Known Allergies      Medication List        Accurate as of January 07, 2023 12:53 PM. If you have any questions, ask your nurse or doctor.          acetaminophen 500 MG tablet Commonly known as: TYLENOL Take 1,000 mg by mouth every 4 (four) hours as needed for mild pain or moderate pain.   apixaban 5 MG Tabs tablet Commonly known as: Eliquis Take 1 tablet (5 mg total) by mouth 2 (two) times daily.   Cranberry 500 MG Caps Take 500 mg by mouth daily.   dextromethorphan-guaiFENesin 10-100 MG/5ML liquid Commonly known as: ROBITUSSIN-DM Take 5 mLs by mouth every 4 (four) hours as needed for cough.   diclofenac Sodium 1 % Gel Commonly known as: VOLTAREN Apply 4gm to lower back topically every 8 hours as needed for discomfort   fluconazole 150 MG tablet Commonly known as: DIFLUCAN Take 150 mg by mouth  as needed. For Tinea Crurisone time per week   furosemide 20 MG tablet Commonly known as: LASIX Take 20 mg by mouth daily.   magnesium hydroxide 400 MG/5ML suspension Commonly known as: MILK OF MAGNESIA Take 30 mLs by mouth. Every 12 hours as needed   melatonin 5 MG Tabs Take 5 mg by mouth at bedtime.   metoprolol succinate 50 MG 24 hr tablet Commonly known as: TOPROL-XL Take 50 mg by mouth daily.   multivitamin with minerals tablet Take 1 tablet by mouth daily.   nystatin powder Commonly known as: MYCOSTATIN/NYSTOP Apply to  Groin topically as needed for rash.   torsemide 20 MG tablet Commonly known as: Demadex Take 1 tablet (20 mg total) by mouth daily.        Review of Systems  Constitutional:  Negative for activity change, appetite change and fever.  HENT:  Negative for sore throat.   Eyes: Negative.   Respiratory:  Positive for cough.   Cardiovascular:  Negative for chest pain and leg swelling.  Gastrointestinal:  Negative for abdominal distention, diarrhea and vomiting.  Genitourinary:  Negative for dysuria, frequency and urgency.  Skin:  Negative for color change.  Neurological:  Negative for dizziness and headaches.  Psychiatric/Behavioral:  Negative for behavioral problems and sleep disturbance. The patient is not nervous/anxious.     Immunization History  Administered Date(s) Administered   Covid-19, Mrna,Vaccine(Spikevax)37yr and older 11/14/2022   Fluad Quad(high Dose 65+) 10/01/2022   Influenza Inj Mdck Quad Pf 09/24/2018, 09/10/2019   Influenza,inj,Quad PF,6+ Mos 09/23/2017, 09/14/2020   Influenza-Unspecified 09/23/2016, 09/23/2017   Moderna Covid-19 Vaccine Bivalent Booster 140yr& up 09/07/2021, 05/14/2022   Moderna SARS-COV2 Booster Vaccination 10/31/2020, 05/03/2021   Moderna Sars-Covid-2 Vaccination 12/31/2019, 01/28/2020   Pneumococcal Conjugate-13 02/24/2014   Pneumococcal Polysaccharide-23 08/17/1999   Zoster, Live 04/07/2008   Pertinent  Health Maintenance Due  Topic Date Due   INFLUENZA VACCINE  Completed      06/17/2021    9:00 PM 06/18/2021    3:00 PM 06/19/2021   12:00 AM 06/19/2021   10:33 AM 12/22/2022    9:13 PM  Fall Risk  (RETIRED) Patient Fall Risk Level High fall risk High fall risk High fall risk High fall risk Low fall risk   Functional Status Survey:    Vitals:   01/07/23 1235  BP: 119/76  Pulse: 88  Resp: 18  Temp: 98.1 F (36.7 C)  SpO2: 97%  Weight: 193 lb 9.6 oz (87.8 kg)  Height: '5\' 9"'$  (1.753 m)   Body mass index is 28.59 kg/m. Physical  Exam Constitutional:      General: He is not in acute distress.    Appearance: Normal appearance.  HENT:     Head: Normocephalic and atraumatic.     Mouth/Throat:     Mouth: Mucous membranes are moist.  Eyes:     Conjunctiva/sclera: Conjunctivae normal.  Cardiovascular:     Rate and Rhythm: Normal rate and regular rhythm.     Pulses: Normal pulses.     Heart sounds: Normal heart sounds.  Pulmonary:     Effort: Pulmonary effort is normal.     Breath sounds: Rales present. No wheezing.  Abdominal:     General: Bowel sounds are normal.     Palpations: Abdomen is soft.  Musculoskeletal:        General: No swelling. Normal range of motion.     Cervical back: Normal range of motion.  Skin:    General: Skin is warm  and dry.  Neurological:     Mental Status: He is alert. Mental status is at baseline.  Psychiatric:        Mood and Affect: Mood normal.        Behavior: Behavior normal.        Thought Content: Thought content normal.        Judgment: Judgment normal.     Labs reviewed: Recent Labs    10/21/22 0000 12/23/22 0022 12/26/22 0000  NA 140 136 139  K 4.4 4.2 4.4  CL 104 103 105  CO2 26* 24 27*  GLUCOSE  --  99  --   BUN 34* 32* 35*  CREATININE 1.3 1.24 1.2  CALCIUM 9.3 8.6* 8.9   No results for input(s): "AST", "ALT", "ALKPHOS", "BILITOT", "PROT", "ALBUMIN" in the last 8760 hours. Recent Labs    10/21/22 0000 12/22/22 2129  WBC 7.3 7.8  NEUTROABS 4,840.00 5.7  HGB 16.2 16.1  HCT 47 49.6  MCV  --  91.9  PLT 199 233   Lab Results  Component Value Date   TSH 1.361 06/12/2021   Lab Results  Component Value Date   HGBA1C 5.7 10/21/2022   No results found for: "CHOL", "HDL", "LDLCALC", "LDLDIRECT", "TRIG", "CHOLHDL"  Significant Diagnostic Results in last 30 days:  DG Chest Portable 1 View  Result Date: 12/22/2022 CLINICAL DATA:  Chest pain EXAM: PORTABLE CHEST 1 VIEW COMPARISON:  Chest x-ray 06/11/2021 FINDINGS: The heart is enlarged. There central  pulmonary vascular congestion. There are small bilateral pleural effusions with bibasilar atelectasis. No pneumothorax or acute fracture. IMPRESSION: Cardiomegaly with pulmonary vascular congestion and small bilateral pleural effusions. Electronically Signed   By: Ronney Asters M.D.   On: 12/22/2022 21:42    Assessment/Plan  1. Acute respiratory failure, unspecified whether with hypoxia or hypercapnia (HCC) -  O2 sat on room air 96-97% -   chest x-ray has small right-sided layering effusion -  will give Lasix 20 mg 1 tab daily X 3 days -  monitor O2 sats  2. Chronic systolic heart failure (HCC) -  will start Lasix 20 mg daily X 3 days  3.  Productive cough  -  will start on Tussin-DM 10-100 mg/5 ml give 10 ml Q 8 hours X 5 days   Family/ staff Communication:  Discussed plan of care with resident, wife and charge nurse.   Labs/tests ordered:   None

## 2023-01-09 DIAGNOSIS — I509 Heart failure, unspecified: Secondary | ICD-10-CM | POA: Diagnosis not present

## 2023-01-09 LAB — BASIC METABOLIC PANEL
BUN: 31 — AB (ref 4–21)
CO2: 28 — AB (ref 13–22)
Chloride: 100 (ref 99–108)
Creatinine: 1.3 (ref 0.6–1.3)
Glucose: 72
Potassium: 4.2 mEq/L (ref 3.5–5.1)
Sodium: 137 (ref 137–147)

## 2023-01-09 LAB — CBC: RBC: 5.38 — AB (ref 3.87–5.11)

## 2023-01-09 LAB — CBC AND DIFFERENTIAL
HCT: 48 (ref 41–53)
Hemoglobin: 16 (ref 13.5–17.5)
Neutrophils Absolute: 3893
Platelets: 208 10*3/uL (ref 150–400)
WBC: 6.3

## 2023-01-09 LAB — COMPREHENSIVE METABOLIC PANEL
Calcium: 8.5 — AB (ref 8.7–10.7)
eGFR: 54

## 2023-01-31 DIAGNOSIS — I10 Essential (primary) hypertension: Secondary | ICD-10-CM | POA: Diagnosis not present

## 2023-01-31 DIAGNOSIS — I4819 Other persistent atrial fibrillation: Secondary | ICD-10-CM | POA: Diagnosis not present

## 2023-01-31 DIAGNOSIS — N1832 Chronic kidney disease, stage 3b: Secondary | ICD-10-CM | POA: Diagnosis not present

## 2023-01-31 DIAGNOSIS — I5022 Chronic systolic (congestive) heart failure: Secondary | ICD-10-CM | POA: Diagnosis not present

## 2023-01-31 DIAGNOSIS — E7800 Pure hypercholesterolemia, unspecified: Secondary | ICD-10-CM | POA: Diagnosis not present

## 2023-02-11 DIAGNOSIS — R5383 Other fatigue: Secondary | ICD-10-CM | POA: Diagnosis not present

## 2023-02-11 DIAGNOSIS — R410 Disorientation, unspecified: Secondary | ICD-10-CM | POA: Diagnosis not present

## 2023-02-11 DIAGNOSIS — R4182 Altered mental status, unspecified: Secondary | ICD-10-CM | POA: Diagnosis not present

## 2023-02-11 DIAGNOSIS — N390 Urinary tract infection, site not specified: Secondary | ICD-10-CM | POA: Diagnosis not present

## 2023-02-17 ENCOUNTER — Non-Acute Institutional Stay (SKILLED_NURSING_FACILITY): Payer: Medicare PPO | Admitting: Student

## 2023-02-17 DIAGNOSIS — I5022 Chronic systolic (congestive) heart failure: Secondary | ICD-10-CM

## 2023-02-17 DIAGNOSIS — I4819 Other persistent atrial fibrillation: Secondary | ICD-10-CM | POA: Diagnosis not present

## 2023-02-17 DIAGNOSIS — W19XXXD Unspecified fall, subsequent encounter: Secondary | ICD-10-CM | POA: Diagnosis not present

## 2023-02-17 DIAGNOSIS — N1832 Chronic kidney disease, stage 3b: Secondary | ICD-10-CM

## 2023-02-17 DIAGNOSIS — I10 Essential (primary) hypertension: Secondary | ICD-10-CM | POA: Diagnosis not present

## 2023-02-17 DIAGNOSIS — F03B18 Unspecified dementia, moderate, with other behavioral disturbance: Secondary | ICD-10-CM | POA: Diagnosis not present

## 2023-02-17 DIAGNOSIS — R531 Weakness: Secondary | ICD-10-CM | POA: Diagnosis not present

## 2023-02-17 NOTE — Progress Notes (Signed)
Location:  Other Nursing Home Room Number: [id number] Place of Service:  SNF (31) Provider:  Theadore Nan, MD  Patient Care Team: Dewayne Shorter, MD as PCP - General (Family Medicine) Alisa Graff, FNP as Nurse Practitioner (Family Medicine) Corey Skains, MD as Consulting Physician (Cardiology)  Extended Emergency Contact Information Primary Emergency Contact: [place] of [place] Mobile Phone: [phone] Relation: Spouse Secondary Emergency Contact: Seth Bake Mobile Phone: [phone] Relation: Daughter  Code Status:  DNR Goals of care: Advanced Directive information    01/07/2023   12:40 PM  Advanced Directives  Does Patient Have a Medical Advance Directive? Yes  Type of Advance Directive Out of facility DNR (pink MOST or yellow form)  Does patient want to make changes to medical advance directive? No - Patient declined  Pre-existing out of facility DNR order (yellow form or pink MOST form) Yellow form placed in chart (order not valid for inpatient use)     Chief Complaint  Patient presents with   OTHER    Chronic disease management     HPI:  Pt is a 89 y.o. male seen today for management of chronic diseases.   Patient has been increasingly confused over the last few months. He has been crying out "Help Help," however once the nurses come to his aid he cannot recall what he needed.   His sister had alzhiemer's and his wife stats she did the same thing before she passed away. She is not concerned about the change. Does not want him overly medicated. Understands that this is notable progression of his underlying dementia. She wants him to remain comfortable.   Past Medical History:  Diagnosis Date   Arrhythmia    atrial fibrillation   Atrial fibrillation, persistent ([place])    on eliquis   Chronic systolic heart failure ([place]) 07/31/2017   Hx of radiation therapy    Hyperlipidemia    Hypertension    Prostate  cancer ([place])    Prostate cancer [facility])    Past Surgical History:  Procedure Laterality Date   CARDIOVERSION N/A 09/10/2017   Procedure: Cardioversion;  Surgeon: Corey Skains, MD;  Location: [facility] ORS;  Service: Cardiovascular;  Laterality: N/A;   CAROTID ARTERY ANGIOPLASTY     CATARACT EXTRACTION Bilateral    INSERTION PROSTATE RADIATION SEED     TONSILLECTOMY      No Known Allergies  Outpatient Encounter Medications as of 02/17/2023  Medication Sig   apixaban (ELIQUIS) 2.5 MG TABS tablet Take 1 tablet (2.5 mg total) by mouth 2 (two) times daily.   acetaminophen (TYLENOL) 500 MG tablet Take 1,000 mg by mouth every 4 (four) hours as needed for mild pain or moderate pain.   Cranberry 500 MG CAPS Take 500 mg by mouth daily.   dextromethorphan-guaiFENesin (ROBITUSSIN-DM) 10-100 MG/5ML liquid Take 5 mLs by mouth every 4 (four) hours as needed for cough.   diclofenac Sodium (VOLTAREN) 1 % GEL Apply 4gm to lower back topically every 8 hours as needed for discomfort   fluconazole (DIFLUCAN) 150 MG tablet Take 150 mg by mouth as needed. For Tinea Crurisone time per week   magnesium hydroxide (MILK OF MAGNESIA) 400 MG/5ML suspension Take 30 mLs by mouth. Every 12 hours as needed   melatonin 5 MG TABS Take 5 mg by mouth at bedtime.   metoprolol succinate (TOPROL-XL) 50 MG 24 hr tablet Take 50 mg by mouth daily.   Multiple Vitamins-Minerals (MULTIVITAMIN WITH MINERALS) tablet Take 1 tablet  by mouth daily.   nystatin (MYCOSTATIN/NYSTOP) powder Apply to Groin topically as needed for rash.   torsemide (DEMADEX) 20 MG tablet Take 1 tablet (20 mg total) by mouth daily.   [DISCONTINUED] apixaban (ELIQUIS) 5 MG TABS tablet Take 1 tablet (5 mg total) by mouth 2 (two) times daily.   [DISCONTINUED] furosemide (LASIX) 20 MG tablet Take 20 mg by mouth daily.   No facility-administered encounter medications on file as of 02/17/2023.    Review of Systems  Immunization History  Administered Date(s)  Administered   Covid-19, Mrna,Vaccine(Spikevax)39yr and older 11/14/2022   Fluad Quad(high Dose 65+) 10/01/2022   Influenza Inj Mdck Quad Pf 09/24/2018, 09/10/2019   Influenza,inj,Quad PF,6+ Mos 09/23/2017, 09/14/2020   Influenza-Unspecified 09/23/2016, 09/23/2017   Moderna Covid-19 Vaccine Bivalent Booster 179yr& up 09/07/2021, 05/14/2022   Moderna SARS-COV2 Booster Vaccination 10/31/2020, 05/03/2021   Moderna Sars-Covid-2 Vaccination 12/31/2019, 01/28/2020   Pneumococcal Conjugate-13 02/24/2014   Pneumococcal Polysaccharide-23 08/17/1999   Zoster, Live 04/07/2008   Pertinent  Health Maintenance Due  Topic Date Due   INFLUENZA VACCINE  Completed      06/17/2021    9:00 PM 06/18/2021    3:00 PM 06/19/2021   12:00 AM 06/19/2021   10:33 AM 12/22/2022    9:13 PM  Fall Risk  (RETIRED) Patient Fall Risk Level High fall risk High fall risk High fall risk High fall risk Low fall risk   Functional Status Survey:    Vitals:   02/17/23 1504  BP: 107/72  Pulse: 96  Resp: 18  SpO2: 97%  Weight: 187 lb 3.2 oz (84.9 kg)   Body mass index is 27.64 kg/m. Physical Exam Vitals reviewed.  Constitutional:      Appearance: Normal appearance.  Cardiovascular:     Rate and Rhythm: Normal rate.     Pulses: Normal pulses.  Pulmonary:     Comments: Decreased effort with inspiration. Decreased breath sounds bilaterally. No crackles.  Neurological:     Mental Status: He is alert. Mental status is at baseline. He is disoriented.     Labs reviewed: Recent Labs    10/21/22 0000 12/23/22 0022 12/26/22 0000  NA 140 136 139  K 4.4 4.2 4.4  CL 104 103 105  CO2 26* 24 27*  GLUCOSE  --  99  --   BUN 34* 32* 35*  CREATININE 1.3 1.24 1.2  CALCIUM 9.3 8.6* 8.9   No results for input(s): "AST", "ALT", "ALKPHOS", "BILITOT", "PROT", "ALBUMIN" in the last 8760 hours. Recent Labs    10/21/22 0000 12/22/22 2129  WBC 7.3 7.8  NEUTROABS 4,840.00 5.7  HGB 16.2 16.1  HCT 47 49.6  MCV  --  91.9   PLT 199 233   Lab Results  Component Value Date   TSH 1.361 06/12/2021   Lab Results  Component Value Date   HGBA1C 5.7 10/21/2022   No results found for: "CHOL", "HDL", "LDLCALC", "LDLDIRECT", "TRIG", "CHOLHDL"  Significant Diagnostic Results in last 30 days:  No results found.  Assessment/Plan Chronic systolic heart failure ([place]- Plan: Do not attempt resuscitation (DNR)  Persistent atrial fibrillation ([place]- Plan: apixaban (ELIQUIS) 2.5 MG TABS tablet  Stage 3b chronic kidney disease (HCCune[place] Primary hypertension  Generalized weakness  Fall, subsequent encounter  Moderate dementia with other behavioral disturbance, unspecified dementia type (HCVivianPatient with progression of underlying dementia. No medication changes at this time. Continue torsemid 20 mg daily. Decrease dose of eliquis to 2.5 mg BID. Continue melatonin 5 mg  nightly.    Family/ staff Communication: Spouse, Nursing  Labs/tests ordered:  none

## 2023-02-21 ENCOUNTER — Encounter: Payer: Self-pay | Admitting: Student

## 2023-02-21 DIAGNOSIS — F0390 Unspecified dementia without behavioral disturbance: Secondary | ICD-10-CM | POA: Insufficient documentation

## 2023-02-21 MED ORDER — APIXABAN 2.5 MG PO TABS: 2.5000 mg | ORAL_TABLET | Freq: Two times a day (BID) | ORAL | Status: DC

## 2023-03-04 DIAGNOSIS — B950 Streptococcus, group A, as the cause of diseases classified elsewhere: Secondary | ICD-10-CM | POA: Diagnosis not present

## 2023-03-04 DIAGNOSIS — Z20818 Contact with and (suspected) exposure to other bacterial communicable diseases: Secondary | ICD-10-CM | POA: Diagnosis not present

## 2023-04-03 DIAGNOSIS — L570 Actinic keratosis: Secondary | ICD-10-CM | POA: Diagnosis not present

## 2023-04-03 DIAGNOSIS — L814 Other melanin hyperpigmentation: Secondary | ICD-10-CM | POA: Diagnosis not present

## 2023-04-03 DIAGNOSIS — L821 Other seborrheic keratosis: Secondary | ICD-10-CM | POA: Diagnosis not present

## 2023-04-24 ENCOUNTER — Non-Acute Institutional Stay (SKILLED_NURSING_FACILITY): Payer: Medicare PPO | Admitting: Nurse Practitioner

## 2023-04-24 ENCOUNTER — Encounter: Payer: Self-pay | Admitting: Nurse Practitioner

## 2023-04-24 DIAGNOSIS — N1832 Chronic kidney disease, stage 3b: Secondary | ICD-10-CM

## 2023-04-24 DIAGNOSIS — I4819 Other persistent atrial fibrillation: Secondary | ICD-10-CM | POA: Diagnosis not present

## 2023-04-24 DIAGNOSIS — I5022 Chronic systolic (congestive) heart failure: Secondary | ICD-10-CM

## 2023-04-24 DIAGNOSIS — I10 Essential (primary) hypertension: Secondary | ICD-10-CM | POA: Diagnosis not present

## 2023-04-24 DIAGNOSIS — F03B18 Unspecified dementia, moderate, with other behavioral disturbance: Secondary | ICD-10-CM | POA: Diagnosis not present

## 2023-04-24 DIAGNOSIS — D6869 Other thrombophilia: Secondary | ICD-10-CM | POA: Diagnosis not present

## 2023-04-24 NOTE — Progress Notes (Signed)
Location:  Other [facility].  Nursing Home Room Number: [facility] 503A Place of Service:  SNF ([phone])  Sydnee Cabal, [place], MD  Patient Care Team: Earnestine Mealing, MD as PCP - General (Family Medicine) Delma Freeze, FNP as Nurse Practitioner (Family Medicine) Lamar Blinks, MD as Consulting Physician (Cardiology)  Extended Emergency Contact Information Primary Emergency Contact: Johny Chess [place] Mobile Phone: [phone] Relation: Spouse Secondary Emergency Contact: Angela Burke Mobile Phone: [phone] Relation: Daughter  Goals of care: Advanced Directive information    04/24/2023   10:03 AM  Advanced Directives  Does Patient Have a Medical Advance Directive? Yes  Type of Advance Directive Out of facility DNR (pink MOST or yellow form)  Does patient want to make changes to medical advance directive? No - Patient declined     Chief Complaint  Patient presents with   Medical Management of Chronic Issues    Medical Management of Chronic Issues.     HPI:  Pt is a 89 y.o. male seen today for medical management of chronic disease. Pt long term resident at [facility]. Doing well. Nursing without concerns.  Wife in the room with pt during visit. No acute concerns or complaints.  Eating well. No pain noted No anxiety or depression noted.  Weight has been stable.    Past Medical History:  Diagnosis Date   Arrhythmia    atrial fibrillation   Atrial fibrillation, persistent (HCC)    on eliquis   Chronic systolic heart failure (HCC) 07/31/2017   Hx of radiation therapy    Hyperlipidemia    Hypertension    Prostate cancer (HCC)    Prostate cancer [facility])    Past Surgical History:  Procedure Laterality Date   CARDIOVERSION N/A 09/10/2017   Procedure: Cardioversion;  Surgeon: Lamar Blinks, MD;  Location: [facility] ORS;  Service: Cardiovascular;  Laterality: N/A;   CAROTID ARTERY ANGIOPLASTY     CATARACT EXTRACTION Bilateral     INSERTION PROSTATE RADIATION SEED     TONSILLECTOMY      No Known Allergies  Outpatient Encounter Medications as of 04/24/2023  Medication Sig   acetaminophen (TYLENOL) 500 MG tablet Take 1,000 mg by mouth every 4 (four) hours as needed for mild pain or moderate pain.   apixaban (ELIQUIS) 2.5 MG TABS tablet Take 1 tablet (2.5 mg total) by mouth 2 (two) times daily.   Cranberry 500 MG CAPS Take 500 mg by mouth daily.   dextromethorphan-guaiFENesin (ROBITUSSIN-DM) 10-100 MG/5ML liquid Take 5 mLs by mouth every 4 (four) hours as needed for cough.   diclofenac Sodium (VOLTAREN) 1 % GEL Apply 4gm to lower back topically every 8 hours as needed for discomfort   fluconazole (DIFLUCAN) 150 MG tablet Take 150 mg by mouth as needed. For Tinea Crurisone time per week   magnesium hydroxide (MILK OF MAGNESIA) 400 MG/5ML suspension Take 30 mLs by mouth. Every 12 hours as needed   melatonin 5 MG TABS Take 5 mg by mouth at bedtime.   metoprolol succinate (TOPROL-XL) 50 MG 24 hr tablet Take 50 mg by mouth daily.   Multiple Vitamins-Minerals (MULTIVITAMIN WITH MINERALS) tablet Take 1 tablet by mouth daily.   nystatin (MYCOSTATIN/NYSTOP) powder Apply to Groin topically as needed for rash.   polyethylene glycol (MIRALAX / GLYCOLAX) 17 g packet Take 17 g by mouth daily. Every Monday,Wednesday and Friday   torsemide (DEMADEX) 20 MG tablet Take 20 mg by mouth daily. Give one tablet by mouth every 24 hours as  needed for weight gain of 5lbs in 1 week or 3lbs overnight.   [DISCONTINUED] torsemide (DEMADEX) 20 MG tablet Take 1 tablet (20 mg total) by mouth daily. (Patient taking differently: Take 20 mg by mouth daily. Give one tablet by mouth every 24 hours as needed for weight gain of 5lbs in 1 week or 3lbs overnight.)   No facility-administered encounter medications on file as of 04/24/2023.    Review of Systems  Unable to perform ROS: Dementia     Immunization History  Administered Date(s) Administered    Covid-19, Mrna,Vaccine(Spikevax)24yrs and older 11/14/2022   Fluad Quad(high Dose 65+) 10/01/2022   Influenza Inj Mdck Quad Pf 09/24/2018, 09/10/2019   Influenza,inj,Quad PF,6+ Mos 09/23/2017, 09/14/2020   Influenza-Unspecified 09/23/2016, 09/23/2017   Moderna Covid-19 Vaccine Bivalent Booster 43yrs & up 09/07/2021, 05/14/2022   Moderna SARS-COV2 Booster Vaccination 10/31/2020, 05/03/2021   Moderna Sars-Covid-2 Vaccination 12/31/2019, 01/28/2020   Pneumococcal Conjugate-13 02/24/2014   Pneumococcal Polysaccharide-23 08/17/1999   Zoster, Live 04/07/2008   Pertinent  Health Maintenance Due  Topic Date Due   INFLUENZA VACCINE  07/17/2023      06/17/2021    9:00 PM 06/18/2021    3:00 PM 06/19/2021   12:00 AM 06/19/2021   10:33 AM 12/22/2022    9:13 PM  Fall Risk  (RETIRED) Patient Fall Risk Level High fall risk High fall risk High fall risk High fall risk Low fall risk   Functional Status Survey:    Vitals:   04/24/23 0954  BP: 114/80  Pulse: 93  Resp: 20  Temp: 97.8 F (36.6 C)  SpO2: 95%  Weight: 184 lb 6.4 oz (83.6 kg)  Height: 5\' 9"  (1.753 m)   Body mass index is 27.23 kg/m. Physical Exam Constitutional:      General: He is not in acute distress.    Appearance: He is well-developed. He is not diaphoretic.  HENT:     Head: Normocephalic and atraumatic.     Right Ear: External ear normal.     Left Ear: External ear normal.     Mouth/Throat:     Pharynx: No oropharyngeal exudate.  Eyes:     Conjunctiva/sclera: Conjunctivae normal.     Pupils: Pupils are equal, round, and reactive to light.  Cardiovascular:     Rate and Rhythm: Normal rate and regular rhythm.     Heart sounds: Normal heart sounds.  Pulmonary:     Effort: Pulmonary effort is normal.     Breath sounds: Normal breath sounds.  Abdominal:     General: Bowel sounds are normal.     Palpations: Abdomen is soft.  Musculoskeletal:        General: No tenderness.     Cervical back: Normal range of motion and  neck supple.     Right lower leg: No edema.     Left lower leg: No edema.  Skin:    General: Skin is warm and dry.  Neurological:     Mental Status: He is alert and oriented to person, place, and time.    Labs reviewed: Recent Labs    12/23/22 0022 12/26/22 0000 01/09/23 0000  NA 136 139 137  K 4.2 4.4 4.2  CL 103 105 100  CO2 24 27* 28*  GLUCOSE 99  --   --   BUN 32* 35* 31*  CREATININE 1.24 1.2 1.3  CALCIUM 8.6* 8.9 8.5*   No results for input(s): "AST", "ALT", "ALKPHOS", "BILITOT", "PROT", "ALBUMIN" in the last 8760 hours. Recent Labs  10/21/22 0000 12/22/22 2129 01/09/23 0000  WBC 7.3 7.8 6.3  NEUTROABS 4,840.00 5.7 3,893.00  HGB 16.2 16.1 16.0  HCT 47 49.6 48  MCV  --  91.9  --   PLT 199 233 208   Lab Results  Component Value Date   TSH 1.361 06/12/2021   Lab Results  Component Value Date   HGBA1C 5.7 10/21/2022   No results found for: "CHOL", "HDL", "LDLCALC", "LDLDIRECT", "TRIG", "CHOLHDL"  Significant Diagnostic Results in last 30 days:  No results found.  Assessment/Plan 1. Chronic systolic heart failure (HCC) -stable, euvolemic, continues on toprol and demadex  2. Persistent atrial fibrillation (HCC) -stable, rate controlled on toprol.   3. Stage 3b chronic kidney disease (HCC) -Chronic and stable Encourage proper hydration Follow metabolic panel Avoid nephrotoxic meds (NSAIDS)  4. Primary hypertension -Blood pressure well controlled, goal bp <140/90 Continue current medications and dietary modifications follow metabolic panel  5. Moderate dementia with other behavioral disturbance, unspecified dementia type (HCC) -Stable, no acute changes in cognitive or functional status, continue supportive care.   6. Acquired thrombophilia (HCC) Due to a fib, continues on eliquis for anticoagulation.    Janene Harvey. Biagio Borg [facility] [phone]

## 2023-05-29 DIAGNOSIS — I4819 Other persistent atrial fibrillation: Secondary | ICD-10-CM | POA: Diagnosis not present

## 2023-05-29 DIAGNOSIS — I5022 Chronic systolic (congestive) heart failure: Secondary | ICD-10-CM | POA: Diagnosis not present

## 2023-05-29 DIAGNOSIS — N1832 Chronic kidney disease, stage 3b: Secondary | ICD-10-CM | POA: Diagnosis not present

## 2023-06-11 ENCOUNTER — Telehealth (INDEPENDENT_AMBULATORY_CARE_PROVIDER_SITE_OTHER): Payer: Medicare PPO | Admitting: Student

## 2023-06-11 DIAGNOSIS — F03B18 Unspecified dementia, moderate, with other behavioral disturbance: Secondary | ICD-10-CM | POA: Diagnosis not present

## 2023-06-11 NOTE — Telephone Encounter (Signed)
Called patient's wife to discuss concern for patient's decline. Patient has had challenges with swallowing, eating less, sleeping more, weight gain in the setting of CHF. He has also had episodes of confusion. Discussed concern that if he were to get ill, it would likely be reason to transition to comfort care. Discussed possibility of Hospice at this time. She would like to discuss further with spouse on 7/3 at 10:30 AM. Also discussed hospitalization status, will place a DO NOT HOSPITALIZE ORDER for patient. Would like to discuss further palliative vs hospice level of care.   I spent 16 minutes discussing goals of care. Location of Patient: Psychiatric nurse of Provider: [facility] community   Coralyn Helling, MD, [facility] [phone]

## 2023-06-12 ENCOUNTER — Non-Acute Institutional Stay (INDEPENDENT_AMBULATORY_CARE_PROVIDER_SITE_OTHER): Payer: Medicare PPO | Admitting: Nurse Practitioner

## 2023-06-12 ENCOUNTER — Encounter: Payer: Self-pay | Admitting: Nurse Practitioner

## 2023-06-12 DIAGNOSIS — Z0000 Encounter for general adult medical examination without abnormal findings: Secondary | ICD-10-CM | POA: Diagnosis not present

## 2023-06-12 NOTE — Patient Instructions (Signed)
  Chris Davis , Thank you for taking time to come for your Medicare Wellness Visit. I appreciate your ongoing commitment to your health goals. Please review the following plan we discussed and let me know if I can assist you in the future.   These are the goals we discussed:  Goals   None     This is a list of the screening recommended for you and due dates:  Health Maintenance  Topic Date Due   COVID-19 Vaccine (6 - 2023-24 season) 01/09/2023   Flu Shot  07/17/2023   Medicare Annual Wellness Visit  06/11/2024   Pneumonia Vaccine  Completed   HPV Vaccine  Aged Out   DTaP/Tdap/Td vaccine  Discontinued   Zoster (Shingles) Vaccine  Discontinued

## 2023-06-12 NOTE — Progress Notes (Signed)
Subjective:   Chris Davis is a 89 y.o. male who presents for Medicare Annual/Subsequent preventive examination.  Visit Complete: In person  Patient Medicare AWV questionnaire was completed by the patient on 06/12/23; I have confirmed that all information answered by patient is correct and no changes since this date.  Review of Systems     Cardiac Risk Factors include: advanced age (>76men, >37 women);hypertension;male gender     Objective:    Today's Vitals   06/12/23 1416  BP: 133/74  Pulse: 90  Resp: 18  Temp: (!) 97.4 F (36.3 C)  SpO2: 97%  Weight: 176 lb 6.4 oz (80 kg)  Height: 5\' 9"  (1.753 m)   Body mass index is 26.05 kg/m.     06/12/2023    2:19 PM 04/24/2023   10:03 AM 01/07/2023   12:40 PM 01/06/2023    1:02 PM 01/02/2023   11:27 AM 12/23/2022   10:55 AM 06/12/2021    9:00 AM  Advanced Directives  Does Patient Have a Medical Advance Directive? Yes Yes Yes Yes Yes Yes Yes  Type of Advance Directive Out of facility DNR (pink MOST or yellow form) Out of facility DNR (pink MOST or yellow form) Out of facility DNR (pink MOST or yellow form) Healthcare Power of [place];Out of facility DNR (pink MOST or yellow form) Out of facility DNR (pink MOST or yellow form) Out of facility DNR (pink MOST or yellow form) Living will  Does patient want to make changes to medical advance directive? No - Patient declined No - Patient declined No - Patient declined No - Patient declined No - Patient declined No - Patient declined No - Patient declined  Copy of Healthcare Power of Attorney in Chart?    Yes - validated most recent copy scanned in chart (See row information)     Pre-existing out of facility DNR order (yellow form or pink MOST form) Yellow form placed in chart (order not valid for inpatient use)  Yellow form placed in chart (order not valid for inpatient use)  Yellow form placed in chart (order not valid for inpatient use)      Current Medications (verified) Outpatient  Encounter Medications as of 06/12/2023  Medication Sig   acetaminophen (TYLENOL) 500 MG tablet Take 1,000 mg by mouth every 4 (four) hours as needed for mild pain or moderate pain.   acetaminophen (TYLENOL) 500 MG tablet Take 500 mg by mouth 3 (three) times daily. For mild pain   apixaban (ELIQUIS) 5 MG TABS tablet Take 5 mg by mouth 2 (two) times daily.   Cranberry 500 MG CAPS Take 500 mg by mouth daily.   dextromethorphan-guaiFENesin (ROBITUSSIN-DM) 10-100 MG/5ML liquid Take 10 mLs by mouth every 4 (four) hours as needed for cough.   diclofenac Sodium (VOLTAREN) 1 % GEL Apply 4gm to lower back topically every 8 hours as needed for discomfort   fluconazole (DIFLUCAN) 150 MG tablet Take 150 mg by mouth as needed. For Tinea Crurisone time per week   magnesium hydroxide (MILK OF MAGNESIA) 400 MG/5ML suspension Take 30 mLs by mouth. Every 12 hours as needed   melatonin 5 MG TABS Take 5 mg by mouth at bedtime.   metoprolol succinate (TOPROL-XL) 50 MG 24 hr tablet Take 50 mg by mouth daily.   Multiple Vitamins-Minerals (MULTIVITAMIN WITH MINERALS) tablet Take 1 tablet by mouth daily.   nystatin (MYCOSTATIN/NYSTOP) powder Apply to Groin topically as needed for rash.   polyethylene glycol (MIRALAX / GLYCOLAX) 17 g packet  Take 17 g by mouth daily. Every Monday,Wednesday and Friday   torsemide (DEMADEX) 20 MG tablet Take 20 mg by mouth daily. Give one tablet by mouth every 24 hours as needed for weight gain of 5lbs in 1 week or 3lbs overnight.   [DISCONTINUED] apixaban (ELIQUIS) 2.5 MG TABS tablet Take 1 tablet (2.5 mg total) by mouth 2 (two) times daily.   No facility-administered encounter medications on file as of 06/12/2023.    Allergies (verified) Patient has no known allergies.   History: Past Medical History:  Diagnosis Date   Arrhythmia    atrial fibrillation   Atrial fibrillation, persistent (HCC)    on eliquis   Chronic systolic heart failure (HCC) 07/31/2017   Hx of radiation therapy     Hyperlipidemia    Hypertension    Prostate cancer (HCC)    Prostate cancer [facility])    Past Surgical History:  Procedure Laterality Date   CARDIOVERSION N/A 09/10/2017   Procedure: Cardioversion;  Surgeon: Lamar Blinks, MD;  Location: [facility] ORS;  Service: Cardiovascular;  Laterality: N/A;   CAROTID ARTERY ANGIOPLASTY     CATARACT EXTRACTION Bilateral    INSERTION PROSTATE RADIATION SEED     TONSILLECTOMY     Family History  Problem Relation Age of Onset   Breast cancer Sister    Throat cancer Paternal Grandmother    Social History   Socioeconomic History   Marital status: Married    Spouse name: Not on file   Number of children: Not on file   Years of education: Not on file   Highest education level: Not on file  Occupational History   Not on file  Tobacco Use   Smoking status: Never   Smokeless tobacco: Never  Vaping Use   Vaping Use: Never used  Substance and Sexual Activity   Alcohol use: Yes    Comment: occ   Drug use: No   Sexual activity: Not on file  Other Topics Concern   Not on file  Social History Narrative   Not on file   Social Determinants of Health   Financial Resource Strain: Not on file  Food Insecurity: Not on file  Transportation Needs: Not on file  Physical Activity: Not on file  Stress: Not on file  Social Connections: Not on file    Tobacco Counseling Counseling given: Not Answered   Clinical Intake:  Pre-visit preparation completed: Yes  Pain : No/denies pain     BMI - recorded: 27 Nutritional Status: BMI 25 -29 Overweight Diabetes: No  How often do you need to have someone help you when you read instructions, pamphlets, or other written materials from your doctor or pharmacy?: 5 - Always         Activities of Daily Living    06/12/2023   12:30 PM  In your present state of health, do you have any difficulty performing the following activities:  Hearing? 1  Vision? 0  Difficulty concentrating or making  decisions? 1  Walking or climbing stairs? 1  Dressing or bathing? 1  Doing errands, shopping? 1  Preparing Food and eating ? Y  Using the Toilet? Y  In the past six months, have you accidently leaked urine? Y  Do you have problems with loss of bowel control? Y  Managing your Medications? Y  Managing your Finances? Y  Housekeeping or managing your Housekeeping? Y    Patient Care Team: Earnestine Mealing, MD as PCP - General (Family Medicine) Delma Freeze, FNP  as Nurse Practitioner (Family Medicine) Lamar Blinks, MD as Consulting Physician (Cardiology)  Indicate any recent Medical Services you may have received from other than [facility] providers in the past year (date may be approximate).     Assessment:   This is a routine wellness examination for [place].  Hearing/Vision screen No results found.  Dietary issues and exercise activities discussed:     Goals Addressed   None    Depression Screen    07/30/2017   12:06 PM  PHQ 2/9 Scores  PHQ - 2 Score 0    Fall Risk    06/12/2023   12:32 PM 07/30/2017   12:06 PM  Fall Risk   Falls in the past year? 0 No    MEDICARE RISK AT HOME:   TIMED UP AND GO:  Was the test performed?  No    Cognitive Function:        Immunizations Immunization History  Administered Date(s) Administered   Covid-19, Mrna,Vaccine(Spikevax)79yrs and older 11/14/2022   Fluad Quad(high Dose 65+) 10/01/2022   Influenza Inj Mdck Quad Pf 09/24/2018, 09/10/2019   Influenza,inj,Quad PF,6+ Mos 09/23/2017, 09/14/2020   Influenza-Unspecified 09/23/2016, 09/23/2017   Moderna Covid-19 Vaccine Bivalent Booster 30yrs & up 09/07/2021, 05/14/2022   Moderna SARS-COV2 Booster Vaccination 10/31/2020, 05/03/2021   Moderna Sars-Covid-2 Vaccination 12/31/2019, 01/28/2020   Pneumococcal Conjugate-13 02/24/2014   Pneumococcal Polysaccharide-23 08/17/1999   Zoster, Live 04/07/2008    TDAP status: Due, Education has been provided regarding the importance  of this vaccine. Advised may receive this vaccine at local pharmacy or Health Dept. Aware to provide a copy of the vaccination record if obtained from local pharmacy or Health Dept. Verbalized acceptance and understanding.  Flu Vaccine status: Up to date  Pneumococcal vaccine status: Up to date  Covid-19 vaccine status: Completed vaccines  Qualifies for Shingles Vaccine? Yes   Zostavax completed No   Shingrix Completed?: No.    Education has been provided regarding the importance of this vaccine. Patient has been advised to call insurance company to determine out of pocket expense if they have not yet received this vaccine. Advised may also receive vaccine at local pharmacy or Health Dept. Verbalized acceptance and understanding.  Screening Tests Health Maintenance  Topic Date Due   COVID-19 Vaccine (6 - 2023-24 season) 01/09/2023   INFLUENZA VACCINE  07/17/2023   Medicare Annual Wellness (AWV)  06/11/2024   Pneumonia Vaccine 71+ Years old  Completed   HPV VACCINES  Aged Out   DTaP/Tdap/Td  Discontinued   Zoster Vaccines- Shingrix  Discontinued    Health Maintenance  Health Maintenance Due  Topic Date Due   COVID-19 Vaccine (6 - 2023-24 season) 01/09/2023    Colorectal cancer screening: No longer required.   Lung Cancer Screening: (Low Dose CT Chest recommended if Age 91-80 years, 20 pack-year currently smoking OR have quit w/in 15years.) does not qualify.   Lung Cancer Screening Referral: na  Additional Screening:  Hepatitis C Screening: does not qualify; Completed na  Vision Screening: Recommended annual ophthalmology exams for early detection of glaucoma and other disorders of the eye. Is the patient up to date with their annual eye exam?  Yes  Who is the provider or what is the name of the office in which the patient attends annual eye exams? On site ophthalmology If pt is not established with a provider, would they like to be referred to a provider to establish care?  No .   Dental Screening: Recommended annual dental exams for  proper oral hygiene   Community Resource Referral / Chronic Care Management: CRR required this visit?  No   CCM required this visit?  No     Plan:     I have personally reviewed and noted the following in the patient's chart:   Medical and social history Use of alcohol, tobacco or illicit drugs  Current medications and supplements including opioid prescriptions. Patient is not currently taking opioid prescriptions. Functional ability and status Nutritional status Physical activity Advanced directives List of other physicians Hospitalizations, surgeries, and ER visits in previous 12 months Vitals Screenings to include cognitive, depression, and falls Referrals and appointments  In addition, I have reviewed and discussed with patient certain preventive protocols, quality metrics, and best practice recommendations. A written personalized care plan for preventive services as well as general preventive health recommendations were provided to patient.     Sharon Seller, NP   06/12/2023   Place of service: [facility].

## 2023-06-18 ENCOUNTER — Encounter: Payer: Self-pay | Admitting: Student

## 2023-06-18 ENCOUNTER — Non-Acute Institutional Stay (SKILLED_NURSING_FACILITY): Payer: Medicare PPO | Admitting: Student

## 2023-06-18 DIAGNOSIS — L89112 Pressure ulcer of right upper back, stage 2: Secondary | ICD-10-CM | POA: Diagnosis not present

## 2023-06-18 DIAGNOSIS — I4819 Other persistent atrial fibrillation: Secondary | ICD-10-CM | POA: Diagnosis not present

## 2023-06-18 DIAGNOSIS — I10 Essential (primary) hypertension: Secondary | ICD-10-CM | POA: Diagnosis not present

## 2023-06-18 DIAGNOSIS — C61 Malignant neoplasm of prostate: Secondary | ICD-10-CM

## 2023-06-18 DIAGNOSIS — N1832 Chronic kidney disease, stage 3b: Secondary | ICD-10-CM | POA: Diagnosis not present

## 2023-06-18 DIAGNOSIS — I5022 Chronic systolic (congestive) heart failure: Secondary | ICD-10-CM

## 2023-06-18 DIAGNOSIS — F03B18 Unspecified dementia, moderate, with other behavioral disturbance: Secondary | ICD-10-CM

## 2023-06-18 DIAGNOSIS — L97201 Non-pressure chronic ulcer of unspecified calf limited to breakdown of skin: Secondary | ICD-10-CM

## 2023-06-18 NOTE — Progress Notes (Unsigned)
Location:  Other Nursing Home Room Number: 503 A Place of Service:  SNF (31) Provider:  Earnestine Mealing, MD  Patient Care Team: Earnestine Mealing, MD as PCP - General (Family Medicine) Delma Freeze, FNP as Nurse Practitioner (Family Medicine) Lamar Blinks, MD as Consulting Physician (Cardiology)  Extended Emergency Contact Information Primary Emergency Contact: Johny Chess [place] Mobile Phone: [phone] Relation: Spouse Secondary Emergency Contact: Angela Burke Mobile Phone: [phone] Relation: Daughter  Code Status:  DNR Goals of care: Advanced Directive information    06/18/2023    8:51 AM  Advanced Directives  Does Patient Have a Medical Advance Directive? Yes  Type of Advance Directive Out of facility DNR (pink MOST or yellow form)  Does patient want to make changes to medical advance directive? No - Patient declined  Pre-existing out of facility DNR order (yellow form or pink MOST form) Yellow form placed in chart (order not valid for inpatient use)     Chief Complaint  Patient presents with   Medical Management of Chronic Issues    Routine visit. Discuss need for additional covid boosters or post pone if patient refuses or is not a candidate.     HPI:  Pt is a 89 y.o. male seen today for medical management of chronic diseases.    Patient is sitting slouched in his recliner. He states, what is the point of it all? So what does it all mean? He gives his name and DOB. He denies chest pain, shortness of breath. He sleeps on and off during the encounter. He can give his wife's name.  Discussed with his wife and area coordinator nurse concern that patient's dementia has progressed. He is getting weaker, and while his weight remains stable, his resiliance to fight disease and survive is declining. Discussed concern that his decreased verbal communication and is a sign of progression and he is more vulnerable to disease than  previously. Asked that they think about and consider how they would like him to spend the end of his days. Patient states, "As close to her has possible," and looks at his wife.   His wife states, "I just want to make sure he doesn't have pain and is comfortable."   Past Medical History:  Diagnosis Date   Arrhythmia    atrial fibrillation   Atrial fibrillation, persistent (HCC)    on eliquis   Chronic systolic heart failure (HCC) 07/31/2017   Hx of radiation therapy    Hyperlipidemia    Hypertension    Prostate cancer (HCC)    Prostate cancer [facility])    Past Surgical History:  Procedure Laterality Date   CARDIOVERSION N/A 09/10/2017   Procedure: Cardioversion;  Surgeon: Lamar Blinks, MD;  Location: [facility] ORS;  Service: Cardiovascular;  Laterality: N/A;   CAROTID ARTERY ANGIOPLASTY     CATARACT EXTRACTION Bilateral    INSERTION PROSTATE RADIATION SEED     TONSILLECTOMY      No Known Allergies  Outpatient Encounter Medications as of 06/18/2023  Medication Sig   acetaminophen (TYLENOL) 500 MG tablet Take 500 mg by mouth 3 (three) times daily. For mild pain   apixaban (ELIQUIS) 5 MG TABS tablet Take 5 mg by mouth 2 (two) times daily.   Cranberry 500 MG CAPS Take 500 mg by mouth daily.   dextromethorphan-guaiFENesin (ROBITUSSIN-DM) 10-100 MG/5ML liquid Take 10 mLs by mouth every 4 (four) hours as needed for cough.   diclofenac Sodium (VOLTAREN) 1 % GEL Apply 4gm to lower  back topically every 8 hours as needed for discomfort   fluconazole (DIFLUCAN) 150 MG tablet Take 150 mg by mouth as needed. For Tinea Crurisone time per week   magnesium hydroxide (MILK OF MAGNESIA) 400 MG/5ML suspension Take 30 mLs by mouth. Every 12 hours as needed   melatonin 5 MG TABS Take 5 mg by mouth at bedtime.   metoprolol succinate (TOPROL-XL) 50 MG 24 hr tablet Take 50 mg by mouth daily.   Multiple Vitamins-Minerals (MULTIVITAMIN WITH MINERALS) tablet Take 1 tablet by mouth daily.   nystatin  (MYCOSTATIN/NYSTOP) powder Apply to Groin topically as needed for rash.   polyethylene glycol (MIRALAX / GLYCOLAX) 17 g packet Take 17 g by mouth daily.   torsemide (DEMADEX) 20 MG tablet Take 20 mg by mouth daily. Give one tablet by mouth every 24 hours as needed for weight gain of 5lbs in 1 week or 3lbs overnight.   UNABLE TO FIND Med Name: Medpass two times a day for weight loss   [DISCONTINUED] acetaminophen (TYLENOL) 500 MG tablet Take 1,000 mg by mouth every 4 (four) hours as needed for mild pain or moderate pain.   No facility-administered encounter medications on file as of 06/18/2023.    Review of Systems  Immunization History  Administered Date(s) Administered   Covid-19, Mrna,Vaccine(Spikevax)19yrs and older 11/14/2022   Fluad Quad(high Dose 65+) 10/01/2022   Influenza Inj Mdck Quad Pf 09/24/2018, 09/10/2019   Influenza,inj,Quad PF,6+ Mos 09/23/2017, 09/14/2020   Influenza-Unspecified 09/23/2016, 09/23/2017   Moderna Covid-19 Vaccine Bivalent Booster 56yrs & up 09/07/2021, 05/14/2022   Moderna SARS-COV2 Booster Vaccination 10/31/2020, 05/03/2021   Moderna Sars-Covid-2 Vaccination 12/31/2019, 01/28/2020   Pneumococcal Conjugate-13 02/24/2014   Pneumococcal Polysaccharide-23 08/17/1999   Zoster, Live 04/07/2008   Pertinent  Health Maintenance Due  Topic Date Due   INFLUENZA VACCINE  07/17/2023      06/18/2021    3:00 PM 06/19/2021   12:00 AM 06/19/2021   10:33 AM 12/22/2022    9:13 PM 06/12/2023   12:32 PM  Fall Risk  Falls in the past year?     0  (RETIRED) Patient Fall Risk Level High fall risk High fall risk High fall risk Low fall risk    Functional Status Survey:    Vitals:   06/18/23 0850  BP: 133/74  Pulse: 90  Temp: (!) 97.4 F (36.3 C)  SpO2: 97%  Weight: 183 lb (83 kg)  Height: 5\' 9"  (1.753 m)   Body mass index is 27.02 kg/m. Physical Exam Constitutional:      Comments: Patient sitting in gerichair slouched to the right side.   Cardiovascular:      Rate and Rhythm: Normal rate. Rhythm irregular.     Pulses: Normal pulses.     Heart sounds: Normal heart sounds.  Pulmonary:     Effort: Pulmonary effort is normal.  Abdominal:     General: Abdomen is flat.     Tenderness: There is no left CVA tenderness.  Skin:    Comments: Stage II pressure ulcer of upper back  Neurological:     General: No focal deficit present.     Mental Status: Mental status is at baseline. He is disoriented.     Labs reviewed: Recent Labs    12/23/22 0022 12/26/22 0000 01/09/23 0000  NA 136 139 137  K 4.2 4.4 4.2  CL 103 105 100  CO2 24 27* 28*  GLUCOSE 99  --   --   BUN 32* 35* 31*  CREATININE  1.24 1.2 1.3  CALCIUM 8.6* 8.9 8.5*   No results for input(s): "AST", "ALT", "ALKPHOS", "BILITOT", "PROT", "ALBUMIN" in the last 8760 hours. Recent Labs    10/21/22 0000 12/22/22 2129 01/09/23 0000  WBC 7.3 7.8 6.3  NEUTROABS 4,840.00 5.7 3,893.00  HGB 16.2 16.1 16.0  HCT 47 49.6 48  MCV  --  91.9  --   PLT 199 233 208   Lab Results  Component Value Date   TSH 1.361 06/12/2021   Lab Results  Component Value Date   HGBA1C 5.7 10/21/2022   No results found for: "CHOL", "HDL", "LDLCALC", "LDLDIRECT", "TRIG", "CHOLHDL"  Significant Diagnostic Results in last 30 days:  No results found.  Assessment/Plan Moderate dementia with other behavioral disturbance, unspecified dementia type (HCC) - Plan: Ambulatory referral to Hospice  Skin ulcer of calf, limited to breakdown of skin, unspecified laterality (HCC), Chronic  Cancer of prostate (HCC), Chronic  Stage 3b chronic kidney disease (HCC)  Persistent atrial fibrillation (HCC)  Chronic systolic heart failure (HCC)  Primary hypertension  Pressure injury of right upper back, stage 2 (HCC) Paitent is currenlty at a FAST 7a, however, does lack significant core strength to sit up straight. He speaks in sentences sporadically, however, is total care. He has signs of skin breakdown with stage 2  pressure injury of upper back. Discussed concern this is reflection of his body decline. Supportive care with wound nurse. Hx of prostate cancer in remission. Hx of calf skin ulcer, healed. Atrial fibrillation well-controlled. Appears euvolemic at this time. BP well-controlled. Patient will transition to hospice for family to have additional support at this time. Will plan for comfort measures with any potential decline. All questions answered.   Family/ staff Communication: Cassie Freer, RN, Heather, RN  Labs/tests ordered:  none   I spent greater than 30  minutes for the care of this patient in face to face time, chart review, clinical documentation, patient education. I spent an additional 16 minutes discussing goals of care and advanced care planning.

## 2023-06-27 ENCOUNTER — Other Ambulatory Visit: Payer: Self-pay | Admitting: Student

## 2023-06-27 DIAGNOSIS — Z515 Encounter for palliative care: Secondary | ICD-10-CM | POA: Insufficient documentation

## 2023-06-27 MED ORDER — TRAMADOL HCL 50 MG PO TABS: 50.0000 mg | ORAL_TABLET | Freq: Four times a day (QID) | ORAL | 0 refills | Status: DC | PRN

## 2023-06-27 NOTE — Progress Notes (Signed)
Patient on hospice with increased pain.

## 2023-07-11 ENCOUNTER — Other Ambulatory Visit: Payer: Self-pay | Admitting: Student

## 2023-07-11 DIAGNOSIS — Z515 Encounter for palliative care: Secondary | ICD-10-CM

## 2023-07-11 MED ORDER — MORPHINE SULFATE (CONCENTRATE) 20 MG/ML PO SOLN: 5.0000 mg | ORAL | 0 refills | Status: DC | PRN

## 2023-07-11 NOTE — Progress Notes (Signed)
Patient declining on medication.

## 2023-07-17 DEATH — deceased

## 2023-09-02 ENCOUNTER — Ambulatory Visit (INDEPENDENT_AMBULATORY_CARE_PROVIDER_SITE_OTHER): Payer: Medicare PPO | Admitting: Vascular Surgery
